# Patient Record
Sex: Female | Born: 1955 | Race: Black or African American | Hispanic: No | Marital: Single | Smoking: Never smoker
Health system: Southern US, Community
[De-identification: ages and names within clinical notes are randomized; demographics above are authoritative.]

## PROBLEM LIST (undated history)

## (undated) DIAGNOSIS — I10 Essential (primary) hypertension: Secondary | ICD-10-CM

## (undated) DIAGNOSIS — R0609 Other forms of dyspnea: Secondary | ICD-10-CM

## (undated) DIAGNOSIS — R0600 Dyspnea, unspecified: Secondary | ICD-10-CM

## (undated) HISTORY — DX: Dyspnea, unspecified: R06.00

## (undated) HISTORY — DX: Essential (primary) hypertension: I10

## (undated) HISTORY — DX: Other forms of dyspnea: R06.09

## (undated) HISTORY — PX: CARPAL TUNNEL RELEASE: SHX101

---

## 1999-02-13 ENCOUNTER — Encounter: Admission: RE | Admit: 1999-02-13 | Discharge: 1999-02-13 | Payer: Self-pay | Admitting: Sports Medicine

## 1999-04-30 ENCOUNTER — Other Ambulatory Visit: Admission: RE | Admit: 1999-04-30 | Discharge: 1999-04-30 | Payer: Self-pay | Admitting: *Deleted

## 1999-05-02 ENCOUNTER — Encounter: Admission: RE | Admit: 1999-05-02 | Discharge: 1999-05-02 | Payer: Self-pay | Admitting: Family Medicine

## 1999-05-27 ENCOUNTER — Emergency Department (HOSPITAL_COMMUNITY): Admission: EM | Admit: 1999-05-27 | Discharge: 1999-05-27 | Payer: Self-pay | Admitting: Emergency Medicine

## 1999-06-01 ENCOUNTER — Encounter: Admission: RE | Admit: 1999-06-01 | Discharge: 1999-06-01 | Payer: Self-pay | Admitting: Family Medicine

## 1999-06-19 ENCOUNTER — Encounter: Admission: RE | Admit: 1999-06-19 | Discharge: 1999-06-19 | Payer: Self-pay | Admitting: Sports Medicine

## 1999-07-03 ENCOUNTER — Encounter: Admission: RE | Admit: 1999-07-03 | Discharge: 1999-07-03 | Payer: Self-pay | Admitting: Family Medicine

## 2000-03-05 ENCOUNTER — Encounter: Admission: RE | Admit: 2000-03-05 | Discharge: 2000-03-05 | Payer: Self-pay | Admitting: Family Medicine

## 2000-03-20 ENCOUNTER — Encounter: Admission: RE | Admit: 2000-03-20 | Discharge: 2000-03-20 | Payer: Self-pay | Admitting: Family Medicine

## 2000-03-20 ENCOUNTER — Emergency Department (HOSPITAL_COMMUNITY): Admission: EM | Admit: 2000-03-20 | Discharge: 2000-03-20 | Payer: Self-pay | Admitting: Emergency Medicine

## 2000-04-02 ENCOUNTER — Encounter: Admission: RE | Admit: 2000-04-02 | Discharge: 2000-04-02 | Payer: Self-pay | Admitting: Family Medicine

## 2000-07-31 ENCOUNTER — Encounter: Admission: RE | Admit: 2000-07-31 | Discharge: 2000-07-31 | Payer: Self-pay | Admitting: Family Medicine

## 2000-11-19 ENCOUNTER — Emergency Department (HOSPITAL_COMMUNITY): Admission: EM | Admit: 2000-11-19 | Discharge: 2000-11-19 | Payer: Self-pay | Admitting: Emergency Medicine

## 2000-12-01 ENCOUNTER — Encounter: Admission: RE | Admit: 2000-12-01 | Discharge: 2000-12-01 | Payer: Self-pay | Admitting: Family Medicine

## 2001-04-01 ENCOUNTER — Encounter: Admission: RE | Admit: 2001-04-01 | Discharge: 2001-04-01 | Payer: Self-pay | Admitting: Family Medicine

## 2001-07-30 ENCOUNTER — Emergency Department (HOSPITAL_COMMUNITY): Admission: EM | Admit: 2001-07-30 | Discharge: 2001-07-30 | Payer: Self-pay | Admitting: Emergency Medicine

## 2001-07-30 ENCOUNTER — Encounter: Payer: Self-pay | Admitting: Emergency Medicine

## 2001-10-16 ENCOUNTER — Encounter: Admission: RE | Admit: 2001-10-16 | Discharge: 2001-10-16 | Payer: Self-pay | Admitting: Family Medicine

## 2001-10-16 ENCOUNTER — Other Ambulatory Visit: Admission: RE | Admit: 2001-10-16 | Discharge: 2001-10-16 | Payer: Self-pay | Admitting: Family Medicine

## 2002-01-08 ENCOUNTER — Encounter: Admission: RE | Admit: 2002-01-08 | Discharge: 2002-01-08 | Payer: Self-pay | Admitting: Family Medicine

## 2002-06-03 ENCOUNTER — Encounter: Admission: RE | Admit: 2002-06-03 | Discharge: 2002-06-03 | Payer: Self-pay | Admitting: Family Medicine

## 2002-09-09 ENCOUNTER — Encounter: Admission: RE | Admit: 2002-09-09 | Discharge: 2002-09-09 | Payer: Self-pay | Admitting: Family Medicine

## 2002-11-12 ENCOUNTER — Emergency Department (HOSPITAL_COMMUNITY): Admission: EM | Admit: 2002-11-12 | Discharge: 2002-11-12 | Payer: Self-pay | Admitting: Emergency Medicine

## 2002-11-17 ENCOUNTER — Encounter: Admission: RE | Admit: 2002-11-17 | Discharge: 2002-11-17 | Payer: Self-pay | Admitting: Family Medicine

## 2003-02-10 ENCOUNTER — Encounter: Admission: RE | Admit: 2003-02-10 | Discharge: 2003-02-10 | Payer: Self-pay | Admitting: Family Medicine

## 2003-04-28 ENCOUNTER — Encounter: Admission: RE | Admit: 2003-04-28 | Discharge: 2003-04-28 | Payer: Self-pay | Admitting: Sports Medicine

## 2003-04-28 ENCOUNTER — Other Ambulatory Visit: Admission: RE | Admit: 2003-04-28 | Discharge: 2003-04-28 | Payer: Self-pay | Admitting: Family Medicine

## 2003-12-05 ENCOUNTER — Encounter: Admission: RE | Admit: 2003-12-05 | Discharge: 2003-12-05 | Payer: Self-pay | Admitting: Sports Medicine

## 2003-12-06 ENCOUNTER — Encounter: Admission: RE | Admit: 2003-12-06 | Discharge: 2003-12-06 | Payer: Self-pay | Admitting: Sports Medicine

## 2003-12-15 ENCOUNTER — Encounter: Admission: RE | Admit: 2003-12-15 | Discharge: 2003-12-15 | Payer: Self-pay | Admitting: Sports Medicine

## 2003-12-22 ENCOUNTER — Encounter: Admission: RE | Admit: 2003-12-22 | Discharge: 2004-03-21 | Payer: Self-pay | Admitting: Sports Medicine

## 2004-02-22 ENCOUNTER — Encounter: Admission: RE | Admit: 2004-02-22 | Discharge: 2004-02-22 | Payer: Self-pay | Admitting: Family Medicine

## 2004-04-30 ENCOUNTER — Encounter: Admission: RE | Admit: 2004-04-30 | Discharge: 2004-04-30 | Payer: Self-pay | Admitting: Family Medicine

## 2004-10-04 ENCOUNTER — Ambulatory Visit: Payer: Self-pay | Admitting: Family Medicine

## 2005-01-22 ENCOUNTER — Ambulatory Visit: Payer: Self-pay | Admitting: Family Medicine

## 2005-05-07 ENCOUNTER — Ambulatory Visit: Payer: Self-pay | Admitting: Family Medicine

## 2005-08-21 ENCOUNTER — Ambulatory Visit: Payer: Self-pay | Admitting: Family Medicine

## 2005-08-29 ENCOUNTER — Ambulatory Visit (HOSPITAL_COMMUNITY): Admission: RE | Admit: 2005-08-29 | Discharge: 2005-08-29 | Payer: Self-pay | Admitting: Family Medicine

## 2005-09-23 ENCOUNTER — Ambulatory Visit: Payer: Self-pay | Admitting: Sports Medicine

## 2005-11-30 ENCOUNTER — Emergency Department (HOSPITAL_COMMUNITY): Admission: EM | Admit: 2005-11-30 | Discharge: 2005-11-30 | Payer: Self-pay | Admitting: Emergency Medicine

## 2006-02-23 ENCOUNTER — Encounter (INDEPENDENT_AMBULATORY_CARE_PROVIDER_SITE_OTHER): Payer: Self-pay | Admitting: *Deleted

## 2006-02-23 LAB — CONVERTED CEMR LAB

## 2006-03-05 ENCOUNTER — Ambulatory Visit: Payer: Self-pay | Admitting: Family Medicine

## 2006-06-12 ENCOUNTER — Ambulatory Visit: Payer: Self-pay | Admitting: Family Medicine

## 2006-07-15 ENCOUNTER — Ambulatory Visit: Payer: Self-pay | Admitting: Family Medicine

## 2006-08-14 ENCOUNTER — Ambulatory Visit: Payer: Self-pay | Admitting: Family Medicine

## 2006-10-02 ENCOUNTER — Ambulatory Visit: Payer: Self-pay | Admitting: Sports Medicine

## 2006-10-09 ENCOUNTER — Ambulatory Visit (HOSPITAL_COMMUNITY): Admission: RE | Admit: 2006-10-09 | Discharge: 2006-10-09 | Payer: Self-pay | Admitting: Family Medicine

## 2006-11-03 ENCOUNTER — Emergency Department (HOSPITAL_COMMUNITY): Admission: EM | Admit: 2006-11-03 | Discharge: 2006-11-03 | Payer: Self-pay | Admitting: Emergency Medicine

## 2007-01-22 DIAGNOSIS — N951 Menopausal and female climacteric states: Secondary | ICD-10-CM | POA: Insufficient documentation

## 2007-01-22 DIAGNOSIS — J309 Allergic rhinitis, unspecified: Secondary | ICD-10-CM | POA: Insufficient documentation

## 2007-01-22 DIAGNOSIS — I10 Essential (primary) hypertension: Secondary | ICD-10-CM | POA: Insufficient documentation

## 2007-01-23 ENCOUNTER — Encounter (INDEPENDENT_AMBULATORY_CARE_PROVIDER_SITE_OTHER): Payer: Self-pay | Admitting: *Deleted

## 2007-04-28 ENCOUNTER — Telehealth: Payer: Self-pay | Admitting: *Deleted

## 2007-04-28 ENCOUNTER — Encounter: Payer: Self-pay | Admitting: *Deleted

## 2007-04-30 ENCOUNTER — Ambulatory Visit: Payer: Self-pay | Admitting: Family Medicine

## 2007-04-30 DIAGNOSIS — G5600 Carpal tunnel syndrome, unspecified upper limb: Secondary | ICD-10-CM

## 2007-06-04 ENCOUNTER — Encounter (INDEPENDENT_AMBULATORY_CARE_PROVIDER_SITE_OTHER): Payer: Self-pay | Admitting: *Deleted

## 2007-06-10 ENCOUNTER — Telehealth (INDEPENDENT_AMBULATORY_CARE_PROVIDER_SITE_OTHER): Payer: Self-pay | Admitting: *Deleted

## 2007-07-08 ENCOUNTER — Ambulatory Visit: Payer: Self-pay | Admitting: Family Medicine

## 2007-07-08 ENCOUNTER — Encounter (INDEPENDENT_AMBULATORY_CARE_PROVIDER_SITE_OTHER): Payer: Self-pay | Admitting: *Deleted

## 2007-07-08 LAB — CONVERTED CEMR LAB
Calcium: 10.1 mg/dL (ref 8.4–10.5)
Creatinine, Ser: 0.8 mg/dL (ref 0.40–1.20)

## 2007-07-10 ENCOUNTER — Encounter (INDEPENDENT_AMBULATORY_CARE_PROVIDER_SITE_OTHER): Payer: Self-pay | Admitting: *Deleted

## 2007-07-21 ENCOUNTER — Encounter: Payer: Self-pay | Admitting: *Deleted

## 2007-07-21 ENCOUNTER — Ambulatory Visit: Payer: Self-pay | Admitting: Family Medicine

## 2008-02-29 ENCOUNTER — Ambulatory Visit: Payer: Self-pay | Admitting: Family Medicine

## 2008-06-23 ENCOUNTER — Ambulatory Visit: Payer: Self-pay | Admitting: Family Medicine

## 2008-06-23 ENCOUNTER — Encounter: Payer: Self-pay | Admitting: *Deleted

## 2008-10-04 ENCOUNTER — Telehealth: Payer: Self-pay | Admitting: *Deleted

## 2008-10-31 ENCOUNTER — Ambulatory Visit: Payer: Self-pay | Admitting: Family Medicine

## 2008-10-31 ENCOUNTER — Encounter: Payer: Self-pay | Admitting: Family Medicine

## 2008-10-31 LAB — CONVERTED CEMR LAB
Direct LDL: 114 mg/dL — ABNORMAL HIGH
Potassium: 3.2 meq/L — ABNORMAL LOW (ref 3.5–5.3)
Sodium: 142 meq/L (ref 135–145)

## 2008-11-01 ENCOUNTER — Telehealth: Payer: Self-pay | Admitting: *Deleted

## 2008-11-04 ENCOUNTER — Encounter: Payer: Self-pay | Admitting: Family Medicine

## 2008-11-04 DIAGNOSIS — E876 Hypokalemia: Secondary | ICD-10-CM | POA: Insufficient documentation

## 2009-08-03 ENCOUNTER — Ambulatory Visit: Payer: Self-pay | Admitting: Family Medicine

## 2009-08-04 ENCOUNTER — Encounter: Payer: Self-pay | Admitting: Family Medicine

## 2009-08-04 ENCOUNTER — Telehealth (INDEPENDENT_AMBULATORY_CARE_PROVIDER_SITE_OTHER): Payer: Self-pay | Admitting: *Deleted

## 2009-08-04 LAB — CONVERTED CEMR LAB
ALT: 16 units/L (ref 0–35)
CO2: 24 meq/L (ref 19–32)
Calcium: 9.8 mg/dL (ref 8.4–10.5)
Chloride: 106 meq/L (ref 96–112)
Sodium: 143 meq/L (ref 135–145)
Total Protein: 7.6 g/dL (ref 6.0–8.3)

## 2009-08-15 ENCOUNTER — Ambulatory Visit: Payer: Self-pay | Admitting: Gastroenterology

## 2009-08-24 ENCOUNTER — Ambulatory Visit: Payer: Self-pay | Admitting: Gastroenterology

## 2009-08-24 ENCOUNTER — Encounter: Payer: Self-pay | Admitting: Gastroenterology

## 2009-08-27 ENCOUNTER — Encounter: Payer: Self-pay | Admitting: Gastroenterology

## 2009-10-10 ENCOUNTER — Ambulatory Visit (HOSPITAL_COMMUNITY): Admission: RE | Admit: 2009-10-10 | Discharge: 2009-10-10 | Payer: Self-pay | Admitting: Family Medicine

## 2009-12-01 ENCOUNTER — Telehealth: Payer: Self-pay | Admitting: Family Medicine

## 2009-12-13 ENCOUNTER — Ambulatory Visit: Payer: Self-pay | Admitting: Family Medicine

## 2010-01-29 ENCOUNTER — Ambulatory Visit: Payer: Self-pay | Admitting: Family Medicine

## 2010-02-12 ENCOUNTER — Ambulatory Visit: Payer: Self-pay | Admitting: Family Medicine

## 2010-05-11 ENCOUNTER — Encounter: Payer: Self-pay | Admitting: Family Medicine

## 2010-06-18 ENCOUNTER — Encounter: Payer: Self-pay | Admitting: Family Medicine

## 2010-06-18 ENCOUNTER — Ambulatory Visit: Payer: Self-pay | Admitting: Family Medicine

## 2010-06-18 LAB — CONVERTED CEMR LAB
ALT: 20 units/L (ref 0–35)
Alkaline Phosphatase: 88 units/L (ref 39–117)
Glucose, Bld: 73 mg/dL (ref 70–99)
LDL Cholesterol: 116 mg/dL — ABNORMAL HIGH (ref 0–99)
Sodium: 142 meq/L (ref 135–145)
Total Bilirubin: 0.5 mg/dL (ref 0.3–1.2)
Total CHOL/HDL Ratio: 3.2
Total Protein: 7.4 g/dL (ref 6.0–8.3)
Triglycerides: 60 mg/dL (ref <150)
VLDL: 12 mg/dL (ref 0–40)

## 2010-06-19 ENCOUNTER — Encounter: Payer: Self-pay | Admitting: Family Medicine

## 2010-06-20 ENCOUNTER — Telehealth: Payer: Self-pay | Admitting: *Deleted

## 2010-09-04 ENCOUNTER — Telehealth: Payer: Self-pay | Admitting: Family Medicine

## 2010-09-04 ENCOUNTER — Emergency Department (HOSPITAL_COMMUNITY): Admission: EM | Admit: 2010-09-04 | Discharge: 2010-09-04 | Payer: Self-pay | Admitting: Family Medicine

## 2010-12-16 ENCOUNTER — Encounter: Payer: Self-pay | Admitting: Sports Medicine

## 2010-12-27 NOTE — Assessment & Plan Note (Signed)
Summary: CPE WITH PAP/KH   Vital Signs:  Patient profile:   55 year old female Weight:      141.1 pounds Temp:     98.2 degrees F oral Pulse rate:   78 / minute BP sitting:   136 / 90  (left arm) Cuff size:   regular  Vitals Entered By: Loralee Pacas CMA (June 18, 2010 9:12 AM)  Visit Type:  cpe Primary Provider:  [name]  MD   History of Present Illness: CC: CPE, F/U CTS, and rash  HPI: CTS has become worse since her last visit.  Pain comes and goes and radiates from both hands to arms and neck.  Still refuses steroid injections due to fear of needles.  Takes OTC Aleve 200mg  one tablet every 2 weeks for flare-ups.    Atopic dermatitis on both arms and chest.  Wants Rx for topical steroids and refill for Cetirizine tablets.  ROS:  Denies any CP, nausea/vomiting, SOB, or HA.  Does have nasal congestion c/w allergic rhinitis.  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 One Tab By Mouth Daily 2)  Hydrochlorothiazide 25 Mg Tabs (Hydrochlorothiazide) .... One Tab By Mouth Daily 3)  Klor-Con M20 20 Meq Cr-Tabs (Potassium Chloride Crys Cr) .... One Tab By Mouth Daily While Taking Hydrochlorothiazide  Current Medications (verified): 1)  Cetirizine Hcl 10 Mg Tabs (Cetirizine Hcl) .... One Tab By Mouth Daily 2)  Hydrochlorothiazide 25 Mg Tabs (Hydrochlorothiazide) .... One Tab By Mouth Daily 3)  Klor-Con M20 20 Meq Cr-Tabs (Potassium Chloride Crys Cr) .... One Tab By Mouth Daily While Taking Hydrochlorothiazide 4)  Triamcinolone Acetonide 0.1 % Oint (Triamcinolone Acetonide) .... Apply Ointment To Affected Area Twice A Day.  Allergies (verified): 1)  ! Codeine  Past History:  Past Medical History: Last updated:  01/22/2007 LFTs wnl and CR 0.7 in 6/04, SCr = 0.71 (05/2006), trichomonas (10/2001)  Past Surgical History: Last updated: 02/12/2010 c/s x2; G2P2 Cholecystectomy - 09/25/1998, Tubal ligation - 09/25/1985  Family History: Last updated: 01/22/2007 father - unknown, mother - HTN, sisters x3 - sickle cell, son - mild MR  Social History: Last updated: 07/08/2007 lives w/ long time boyfriend of >31yrs; 2 adults sons in home; ; + etoh 1-2beers a day and binge on weekends - (2) 12 packs; no tobacco; most of family in [place].  used to work at [employer].    Risk Factors: Smoking Status: never (01/29/2010) Packs/Day: 1-2 per week (02/29/2008)  Family History: Reviewed history from 01/22/2007 and no changes required. father - unknown, mother - HTN, sisters x3 - sickle cell, son - mild MR  Social History: Reviewed history from 07/08/2007 and no changes required. lives w/ long time boyfriend of >57yrs; 2 adults sons in home; ; + etoh 1-2beers a day and binge on weekends - (2) 12 packs; no tobacco; most of family in [place].  used to work at [employer].    Review of Systems       per hpi  Physical Exam  General:  Well-developed,well-nourished,in no acute distress; alert,appropriate and cooperative throughout examination Head:  Normocephalic and atraumatic without obvious abnormalities. No apparent alopecia or balding. Eyes:  No corneal or conjunctival inflammation noted. EOMI. Perrla. Funduscopic exam benign, without hemorrhages, exudates  or papilledema. Vision grossly normal. Mouth:  Oral mucosa and oropharynx without lesions or exudates.  Teeth in good repair. Lungs:  Normal respiratory effort, chest expands symmetrically. Lungs are clear to auscultation, no crackles or wheezes. Heart:  Normal rate and regular rhythm. S1 and S2 normal without gallop, murmur, click, rub or other extra sounds. Abdomen:  Bowel sounds positive,abdomen soft and non-tender without masses, organomegaly or hernias noted. Msk:  no joint  swelling, no joint warmth, no muscle atrophy, joint tenderness, and enlarged PIP joints.     Impression & Recommendations:  Problem # 1:  CARPAL TUNNEL SYNDROME (ICD-354.0) Assessment Unchanged  Patient still c/o pain and numbness in both hands and arms.  States the pain is getting worse.  Does not like needles and hesitant to see Sports Med for steroid injections.  Recommended cock-up splints from [facility] or [facility] to help alleviate symptoms.  Pt agreed to try splints and consider  steroid injections if pain becomes worse.  Will continue to take OTC Aleve for flare-ups.  Advised pt to return to clinic if symptoms become worse or call clinic to set up appt with Sports Medicine clinic.  Orders: [facility]- Est  Level 4 ([address])  Problem # 2:  Preventive Health Care (ICD-V70.0) Assessment: Unchanged Pt refused PAP today.  She did not have time for it at this appt.  Advised pt to reschedule PAP in 1 month.  Pt has completed mammogram and colonoscopy this year and said results were normal.  Will screen for hyperlipidemia today.    Problem # 3:  HYPERTENSION, BENIGN SYSTEMIC (ICD-401.1) Assessment: Unchanged Today pt's BP was 136/90.  BP at last office visit 02/12/2010 was 132/90.  Will continue HCTZ 25 mg one tab by mouth daily.    Her updated medication list for this problem includes:    Hydrochlorothiazide 25 Mg Tabs (Hydrochlorothiazide) ..... One tab by mouth daily Orders: Lipid-[facility] (60454-09811) [facility]- Est  Level 4 (91478)  Problem # 4:  HYPOKALEMIA, MILD (ICD-276.8) Assessment: Unchanged Pt's potassium was 3.3 on 08/04/2009.  Started on Klor-Con.  Will order CMET today to re-evaluate mild hypokalemia.  Orders: Comp Met-[facility] ([id number]) [facility]- Est  Level 4 (57846)  Problem # 5:  RHINITIS, ALLERGIC (ICD-477.9) Pt c/o nasal congestion c/w allergic rhinitis.  Also c/o of mild rash on arms and chest c/w atopic dermatitis.  Pt given refill Cetirizine hcl and Rx triamcinolone ointment for rash.     Her updated medication list for this problem includes:    Cetirizine Hcl 10 Mg Tabs (Cetirizine hcl) ..... One tab by mouth daily  Orders: [facility]- Est  Level 4 ([address]295)  Complete Medication List: 1)  Cetirizine Hcl 10 Mg Tabs (Cetirizine hcl) .... One tab by mouth daily 2)  Hydrochlorothiazide 25 Mg Tabs (Hydrochlorothiazide) .... One tab by mouth daily 3)  Klor-con M20 20 Meq Cr-tabs (Potassium chloride crys cr) .... One tab by mouth daily while taking hydrochlorothiazide 4)  Triamcinolone Acetonide 0.1 % Oint (Triamcinolone acetonide) .... Apply ointment to affected area twice a day.  Patient Instructions: 1)  Please schedule appt for PAP in 1 month. 2)  Consider purchasing splints at [facility] for CTS.  3)  Consider making an appt with Sports Medicine for steroid injections if CTS pain becomes worse. 4)  Will call or mail a letter to you if any lab values are abnormal. 5)  Please schedule a follow-up appointment in 1 year. Prescriptions: TRIAMCINOLONE ACETONIDE 0.1 % OINT (TRIAMCINOLONE ACETONIDE) Apply ointment to affected area twice a  day.  #1 x 2   Entered and Authorized by:   Ivy de Lawson Radar  MD   Signed by:   Ivy de Lawson Radar  MD on 06/18/2010   Method used:   Print then Give to Patient   RxID:   [id number] CETIRIZINE HCL 10 MG TABS (CETIRIZINE HCL) one tab by mouth daily  #30 x 3   Entered and Authorized by:   Ivy de Lawson Radar  MD   Signed by:   [name]  MD on 06/18/2010   Method used:   Print then Give to Patient   RxID:   [id number]

## 2010-12-27 NOTE — Miscellaneous (Signed)
  Clinical Lists Changes  Problems: Removed problem of GASTROENTERITIS, VIRAL (ICD-008.8) Removed problem of SCREENING, COLON CANCER (ICD-V76.51) Medications: Removed medication of PROMETHAZINE HCL 12.5 MG TABS (PROMETHAZINE HCL) Take 1 tab every 4 hours as needed for nausea

## 2010-12-27 NOTE — Progress Notes (Signed)
Summary: re: letters  ---- Converted from flag ---- ---- 06/19/2010 8:02 PM, [name]  MD wrote: Could you please mail both letters to this patient?  I tried routing it to the white team but it didn't work.  Thanks! ------------------------------ Mailed out to pt. today.[name], CMA  June 20, 2010 9:07 AM

## 2010-12-27 NOTE — Assessment & Plan Note (Signed)
Summary: diarrhea,df   Vital Signs:  Patient profile:   55 year old female Height:      63.5 inches Weight:      138.2 pounds BMI:     24.18 Temp:     97.9 degrees F oral Pulse rate:   116 / minute BP sitting:   122 / 89  (left arm) Cuff size:   regular  Vitals Entered By: Garen Grams LPN (January 29, 1609 11:02 AM) CC: diarrhea and nausea x 2 days Is Patient Diabetic? No Pain Assessment Patient in pain? no        Primary Care Provider:  Lequita Asal  MD  CC:  diarrhea and nausea x 2 days.  History of Present Illness: 1. Diarrhea: Pt has had diarrhea since yesterday morning.  There has been multiple sick contacts including her grandson and husband.  Grandson brought to the ED with diarrhea and diagnosed with a stomach bug.  Then her and her husband developed diarrhea.  Husband is feeling better but she is feeling worse.  She has had 2-3 episodes of diarrhea in the past 24 hours.        ROS: Endorses abdominal pain, nausea, but no vomiting.  Denies blood in her stool.  Habits & Providers  Alcohol-Tobacco-Diet     Tobacco Status: never  Current Problems (verified): 1)  Screening, Colon Cancer  (ICD-V76.51) 2)  Hypokalemia, Mild  (ICD-276.8) 3)  Carpal Tunnel Syndrome  (ICD-354.0) 4)  Rhinitis, Allergic  (ICD-477.9) 5)  Menopausal Syndrome  (ICD-627.2) 6)  Hypertension, Benign Systemic  (ICD-401.1)  Current Medications (verified): 1)  Promethazine Hcl 12.5 Mg Tabs (Promethazine Hcl) .... Take 1 Tab Every 4 Hours As Needed For Nausea  Allergies: 1)  ! Codeine  Past History:  Past Medical History: Reviewed history from 01/22/2007 and no changes required. LFTs wnl and CR 0.7 in 6/04, SCr = 0.71 (05/2006), trichomonas (10/2001)  Social History: Reviewed history from 07/08/2007 and no changes required. lives w/ long time boyfriend of >25yrs; 2 adults sons in home; ; + etoh 1-2beers a day and binge on weekends - (2) 12 packs; no tobacco; most of family in [place].  used  to work at [employer].    Physical Exam  General:  Well-developed,well-nourished,in no acute distress; alert,appropriate and cooperative throughout examination. Vitals reviewed and afebrile. Mouth:  edentulous.  OP pink and moist Neck:  no LAD Lungs:  Normal respiratory effort, chest expands symmetrically. Lungs are clear to auscultation, no crackles or wheezes. Heart:  Normal rate and regular rhythm. S1 and S2 normal without gallop, murmur, click, rub or other extra sounds. Abdomen:  Mildly diffusely TTP.  +BS.  No guarding or rebound.  No masses. Extremities:  no edema bilateral lower extremities Skin:  turgor normal, color normal, and no rashes.   Psych:  Oriented X3 and not depressed appearing.     Impression & Recommendations:  Problem # 1:  GASTROENTERITIS, VIRAL (ICD-008.8) Assessment New  She is tolerating POs.  Gave her supportive care measures.  Provided Phenergan for nausea.  Given precautions to seek care sooner.  Orders: [facility]- Est Level  3 (96045)  Complete Medication List: 1)  Promethazine Hcl 12.5 Mg Tabs (Promethazine hcl) .... Take 1 tab every 4 hours as needed for nausea  Patient Instructions: 1)  You most likely have a stomach bug 2)  This is almost always caused by a virus 3)  You need to just let it run its course 4)  I have prescribed  Phenergan for your nausea 5)  Be sure to drink plenty of fluids 6)  If you are unable to keep anything down, have bad abdominal pain, or feel a lot worse than you should be seen again 7)  If not better in 7 days then please return to clinic Prescriptions: PROMETHAZINE HCL 12.5 MG TABS (PROMETHAZINE HCL) Take 1 tab every 4 hours as needed for nausea  #20 x 0   Entered and Authorized by:   Angelena Sole MD   Signed by:   Angelena Sole MD on 01/29/2010   Method used:   Print then Give to Patient   RxID:   [id number] PROMETHAZINE HCL 12.5 MG TABS (PROMETHAZINE HCL) Take 1 tab every 4 hours as needed for nausea  #0 x 0    Entered and Authorized by:   Angelena Sole MD   Signed by:   Angelena Sole MD on 01/29/2010   Method used:   Print then Give to Patient   RxID:   [id number]

## 2010-12-27 NOTE — Letter (Signed)
Summary: Generic Letter  [facility]  [address]   [place], [place] [address]   Phone: [phone]  Fax: [phone]    06/19/2010  Daisy [name] [address] [place], [place]  [address]  Dear [name],  I hope this letter finds you well.  The results of your comprehensive metabolic panel are complete and your lab values are all within normal limits.  Please call the [facility] if you have any questions or concerns.   Tests: (1) Comprehensive Metabolic Panel ([address])   Sodium                    142 mEq/L                   135-145   Potassium                 3.8 mEq/L                   3.5-5.3   Chloride                  105 mEq/L                   96-112   CO2                       25 mEq/L                    19-32   Glucose                   73 mg/dL                    40-34   BUN                       10 mg/dL                    7-42   Creatinine                1.01 mg/dL                  0.40-1.20   Bilirubin, Total          0.5 mg/dL                   5.9-5.6   Alkaline Phosphatase      88 U/L                      39-117   AST/SGOT                  22 U/L                      0-37   ALT/SGPT                  20 U/L                      0-35   Total Protein             7.4 g/dL                    3.8-7.5   Albumin  4.5 g/dL                    1.6-1.0   Calcium                   9.8 mg/dL                   9.6-04.5    Sincerely,   [place], DO

## 2010-12-27 NOTE — Progress Notes (Signed)
Summary: RX  Phone Note Refill Request Call back at Home Phone [phone]   Refills Requested: Medication #1:  HYDROCHLOROTHIAZIDE 25 MG TABS Take 1 tablet by mouth once a day  Medication #2:  TRIAMCINOLONE ACETONIDE 0.1 % CREA apply bid to affected area  dispense 30 grams [facility]  Initial call taken by: Knox Royalty,  December 01, 2009 1:53 PM  Follow-up for Phone Call        will forward to MD. Follow-up by: Theresia Lo RN,  December 04, 2009 11:55 AM    Prescriptions: TRIAMCINOLONE ACETONIDE 0.1 % CREA (TRIAMCINOLONE ACETONIDE) apply bid to affected area  dispense 30 grams  #1 x 3   Entered and Authorized by:   Lequita Asal  MD   Signed by:   Lequita Asal  MD on 12/04/2009   Method used:   Faxed to ...       [facility] (retail)       [address] [place], [place]  [address]       Ph: [phone]       Fax: [phone]   RxID:   [id number] HYDROCHLOROTHIAZIDE 25 MG TABS (HYDROCHLOROTHIAZIDE) Take 1 tablet by mouth once a day  #30 x 5   Entered and Authorized by:   Lequita Asal  MD   Signed by:   Lequita Asal  MD on 12/04/2009   Method used:   Faxed to ...       [facility] (retail)       [address] [place], [place]  [address]       Ph: [phone]       Fax: [phone]   RxID:   [id number]   Appended Document: RX above Rx called to pharmacy as pharmacy did not receive.

## 2010-12-27 NOTE — Progress Notes (Signed)
  Phone Note Call from Patient   Caller: Patient Call For: [phone] Summary of Call: Patient have rash on arms due to ?poison ivy.  Itching and burning to skin.   Initial call taken by: Abundio Miu,  September 04, 2010 12:04 PM  Follow-up for Phone Call        states it itches badly. told her we have no appts left for today. she declined UC due to long waits. appt for 8:30am. advissed getting antiitch creme from pharmacy or use calamine. states she will Follow-up by: Golden Circle RN,  September 04, 2010 12:07 PM

## 2010-12-27 NOTE — Assessment & Plan Note (Signed)
Summary: f/u last visit/eo   Vital Signs:  Patient profile:   55 year old female Weight:      140.4 pounds BMI:     24.57 Temp:     98.7 degrees F oral Pulse rate:   82 / minute Pulse rhythm:   regular BP sitting:   135 / 100  (right arm) Cuff size:   regular  Vitals Entered By: Loralee Pacas CMA (December 13, 2009 2:02 PM) CC: f/u HTN, carpal tunnel   Primary Care Provider:  Lequita Asal  MD  CC:  f/u HTN and carpal tunnel.  History of Present Illness: patient is 55 y/o female here for f/u.  HTN- denies CP, SOB, peripheral edema, DOE, visual changes, HA. taking HCTZ without side effects  Carpal tunnel syndrome- worsening numbness, tingling of bilateral hands. interested in surgery. wearing brace.   Current Medications (verified): 1)  None  Allergies (verified): 1)  ! Codeine  Physical Exam  General:  Well-developed,well-nourished,in no acute distress; alert,appropriate and cooperative throughout examination. vitals reviewed.  Mouth:  edentulous Lungs:  Normal respiratory effort, chest expands symmetrically. Lungs are clear to auscultation, no crackles or wheezes. Heart:  Normal rate and regular rhythm. S1 and S2 normal without gallop, murmur, click, rub or other extra sounds. Msk:  brace on L wrist. TTP of R posterior shoulder.  Extremities:  no edema bilateral lower extremities    Impression & Recommendations:  Problem # 1:  HYPERTENSION, BENIGN SYSTEMIC (ICD-401.1) Assessment Deteriorated  off meds. refills sent prior. f/u 1 month  The following medications were removed from the medication list:    Hydrochlorothiazide 25 Mg Tabs (Hydrochlorothiazide) .Marland Kitchen... Take 1 tablet by mouth once a day  Orders: [facility]- Est Level  3 (04540)  Problem # 2:  CARPAL TUNNEL SYNDROME (ICD-354.0) Assessment: Unchanged  patient opposed to injections. uninsured. continue anti-inflammatory meds and brace as needed  Orders: [facility]- Est Level  3 (98119)  Patient  Instructions: 1)  Please schedule follow up appointment in 4-6 weeks for pap smear and f/u blood pressure Prescriptions: TRIAMCINOLONE ACETONIDE 0.1 % CREA (TRIAMCINOLONE ACETONIDE) apply bid to affected area  dispense 30 grams  #1 x 3   Entered and Authorized by:   Lequita Asal  MD   Signed by:   Lequita Asal  MD on 12/13/2009   Method used:   Electronically to        [facility] (retail)       [address]       [place], [place]  [address]       Ph: [phone]       Fax: [phone]   RxID:   [id number] HYDROCHLOROTHIAZIDE 25 MG TABS (HYDROCHLOROTHIAZIDE) Take 1 tablet by mouth once a day  #30 x 5   Entered and Authorized by:   Lequita Asal  MD   Signed by:   Lequita Asal  MD on 12/13/2009   Method used:   Electronically to        [facility] (retail)       [address]       [place], [place]  [address]       Ph: [phone]       Fax: [phone]   RxID:   [id number]

## 2010-12-27 NOTE — Assessment & Plan Note (Signed)
Summary: sore throat /[place]/   Vital Signs:  Patient profile:   55 year old female Weight:      143.6 pounds Temp:     98.1 degrees F oral Pulse rate:   75 / minute Pulse rhythm:   regular BP sitting:   132 / 90  Vitals Entered By: Loralee Pacas CMA (February 12, 2010 10:44 AM) CC: Sore Throat Comments sore throat since yesterday   CC:  Sore Throat.  Acute Visit History:      The patient complains of nausea, sinus problems, and sore throat.  These symptoms began one day ago.  She denies abdominal pain, chest pain, cough, earache, eye symptoms, fever, headache, and nasal discharge.  Other comments include: h/o allergic rhinitis. has not tried any medications. no sick contacts. no SOB/Chest pain. .        She has had dysphagia associated with the sore throat.  There is no history of drooling, cold/URI symptoms, or recent exposure to strep.        She complains of nasal congestion.  She denies a previous history of sinusitis or previous sinus surgery.        Current Medications (verified): 1)  Promethazine Hcl 12.5 Mg Tabs (Promethazine Hcl) .... Take 1 Tab Every 4 Hours As Needed For Nausea 2)  Cetirizine Hcl 10 Mg Tabs (Cetirizine Hcl) .... One Tab By Mouth Daily 3)  Hydrochlorothiazide 25 Mg Tabs (Hydrochlorothiazide) .... One Tab By Mouth Daily 4)  Klor-Con M20 20 Meq Cr-Tabs (Potassium Chloride Crys Cr) .... One Tab By Mouth Daily While Taking Hydrochlorothiazide  Allergies (verified): 1)  ! Codeine  Past History:  Past Surgical History: c/s x2; G2P2 Cholecystectomy - 09/25/1998, Tubal ligation - 09/25/1985  Physical Exam  General:  Well-developed,well-nourished,in no acute distress; alert,appropriate and cooperative throughout examination. Vitals reviewed and afebrile. Head:  no TTP of frontal or maxillary sinuses Eyes:  no scleral injection or icterus Nose:  no rhinorrhea Mouth:  edentulous. erythema of posterior oropharynx without exudate. Neck:  no lymphadenopathy Lungs:   Normal respiratory effort, chest expands symmetrically. Lungs are clear to auscultation, no crackles or wheezes. Heart:  Normal rate and regular rhythm. S1 and S2 normal without gallop, murmur, click, rub or other extra sounds.   Impression & Recommendations:  Problem # 1:  SORE THROAT (ICD-462) Assessment New  likely 2/2 postnasal drip. h/o allergic rhinitis. will treat with cetirizine.   Orders: [facility]- Est Level  3 ([address]40)  Problem # 2:  HYPERTENSION, BENIGN SYSTEMIC (ICD-401.1) Assessment: Unchanged  refill of HCTZ and potassium chloride.   Her updated medication list for this problem includes:    Hydrochlorothiazide 25 Mg Tabs (Hydrochlorothiazide) ..... One tab by mouth daily  Orders: [facility]- Est Level  3 (98119) Prescriptions: KLOR-CON M20 20 MEQ CR-TABS (POTASSIUM CHLORIDE CRYS CR) one tab by mouth daily WHILE TAKING HYDROCHLOROTHIAZIDE  #30 x 3   Entered and Authorized by:   Lequita Asal  MD   Signed by:   Lequita Asal  MD on 02/12/2010   Method used:   Electronically to        [facility] [phone]* (retail)       [address]       [place], [place]  [address]       Ph: [phone]       Fax: [phone]   RxID:   [id number] HYDROCHLOROTHIAZIDE 25 MG TABS (HYDROCHLOROTHIAZIDE) one tab by mouth daily  #30 x 3   Entered and Authorized  by:   Lequita Asal  MD   Signed by:   Lequita Asal  MD on 02/12/2010   Method used:   Electronically to        [facility] [address] [phone]* (retail)       [address]       [place], [place]  [address]       Ph: [phone]       Fax: [phone]   RxID:   [id number] CETIRIZINE HCL 10 MG TABS (CETIRIZINE HCL) one tab by mouth daily  #30 x 3   Entered and Authorized by:   Lequita Asal  MD   Signed by:   Lequita Asal  MD on 02/12/2010   Method used:   Electronically to        [facility] [address] [phone]* (retail)       [address]       [place], [place]  [address]       Ph: [phone]        Fax: [phone]   RxID:   [id number]    Prevention & Chronic Care Immunizations   Influenza vaccine: refused  (11/04/2008)   Influenza vaccine deferral: Not available  (08/03/2009)   Influenza vaccine due: 11/04/2009    Tetanus booster: 04/26/1999: Done.   Tetanus booster due: 04/25/2009    Pneumococcal vaccine: Not documented  Colorectal Screening   Hemoccult: Not documented    Colonoscopy: Location:  [facility].    (08/24/2009)   Colonoscopy action/deferral: GI Referral  (08/03/2009)   Colonoscopy due: 07/2019  Other Screening   Pap smear: refused  (11/04/2008)   Pap smear due: 11/04/2009    Mammogram: ASSESSMENT: Negative - BI-RADS 1^MM DIGITAL SCREENING  (10/10/2009)   Mammogram action/deferral: Ordered  (08/03/2009)   Mammogram due: 09/26/2007   Smoking status: never  (01/29/2010)  Lipids   Total Cholesterol: Not documented   LDL: Not documented   LDL Direct: 114  (10/31/2008)   HDL: Not documented   Triglycerides: Not documented  Hypertension   Last Blood Pressure: 132 / 90  (02/12/2010)   Serum creatinine: 1.04  (08/04/2009)   Serum potassium 3.3  (08/04/2009)    Hypertension flowsheet reviewed?: Yes   Progress toward BP goal: At goal  Self-Management Support :   Personal Goals (by the next clinic visit) :      Personal blood pressure goal: 140/90  (08/03/2009)   Hypertension self-management support: Written self-care plan  (08/03/2009)    Hypertension self-management support not done because: Good outcomes  (02/12/2010)

## 2010-12-27 NOTE — Letter (Signed)
Summary: Lipid Letter  [facility]  [address]   [place], [place] [address]   Phone: [phone]  Fax: [phone]    06/19/2010  Daisy [name] [address] [place], [place]  [address]  Dear Daisy [name]:  We have carefully reviewed your last lipid profile from 06/18/2010 and the results are noted below with a summary of recommendations for lipid management.    Cholesterol:       186     Goal: < 200   HDL "good" Cholesterol:   58     Goal: > 39   LDL "bad" Cholesterol:   116     Goal: < 100   Triglycerides:       60     Goal: < 150        TLC Diet (Therapeutic Lifestyle Change): Saturated Fats & Transfatty acids should be kept < 7% of total calories ***Reduce Saturated Fats Polyunstaurated Fat can be up to 10% of total calories Monounsaturated Fat Fat can be up to 20% of total calories Total Fat should be no greater than 25-35% of total calories Carbohydrates should be 50-60% of total calories Protein should be approximately 15% of total calories Fiber should be at least 20-30 grams a day ***Increased fiber may help lower LDL Total Cholesterol should be < 200mg /day Consider adding plant stanol/sterols to diet (example: Benacol spread) ***A higher intake of unsaturated fat may reduce Triglycerides and Increase HDL    Adjunctive Measures (may lower LIPIDS and reduce risk of Heart Attack) include: Aerobic Exercise (20-30 minutes 3-4 times a week) Limit Alcohol Consumption Weight Reduction Aspirin 75-81 mg a day by mouth (if not allergic or contraindicated) Dietary Fiber 20-30 grams a day by mouth     Current Medications: 1)    Cetirizine Hcl 10 Mg Tabs (Cetirizine hcl) .... One tab by mouth daily 2)    Hydrochlorothiazide 25 Mg Tabs (Hydrochlorothiazide) .... One tab by mouth daily 3)    Klor-con M20 20 Meq Cr-tabs (Potassium chloride crys cr) .... One tab by mouth daily while taking hydrochlorothiazide 4)    Triamcinolone Acetonide 0.1 % Oint  (Triamcinolone acetonide) .... Apply ointment to affected area twice a day.  If you have any questions, please call. We appreciate being able to work with you.   Sincerely,    [facility] Aashrith Eves de Lawson Radar  MD

## 2011-02-12 ENCOUNTER — Encounter: Payer: Self-pay | Admitting: Family Medicine

## 2011-02-12 ENCOUNTER — Ambulatory Visit (INDEPENDENT_AMBULATORY_CARE_PROVIDER_SITE_OTHER): Payer: Self-pay | Admitting: Family Medicine

## 2011-02-12 VITALS — BP 118/94 | HR 74 | Temp 98.3°F | Ht 64.0 in | Wt 149.0 lb

## 2011-02-12 DIAGNOSIS — J029 Acute pharyngitis, unspecified: Secondary | ICD-10-CM

## 2011-02-12 MED ORDER — GUAIFENESIN 100 MG/5ML PO LIQD: 200.0000 mg | Freq: Three times a day (TID) | ORAL | Status: AC | PRN

## 2011-02-12 MED ORDER — HYDROCHLOROTHIAZIDE 25 MG PO TABS: 25.0000 mg | ORAL_TABLET | Freq: Every day | ORAL | Status: DC

## 2011-02-12 NOTE — Assessment & Plan Note (Signed)
Cough and sore throat likely viral in etiology.  No fever.  Pharyngeal exam wnl, no nodes.  Will treat supportively with Tylenol for comfort.  Will give gauifenesin for cough. Some of evening cough likely post nasal drip.  I've call [facility] and they do not have any allergy med for adults currently.  I have instruct pt to go to [facility] to buy OTC allergy med.

## 2011-02-12 NOTE — Patient Instructions (Signed)
Please come back in one week if sore throat and cough not better.  Your cough is likely from a virus so no need for antibiotic. You should take over the counter allergy medicine. For cough you can take Guaifenesin.  I've sent this to [facility]. Please make an appt with Dr Tye Savoy for blood pressure.   Upper Respiratory Infection (URI), Adult An upper respiratory infection (URI) is also known as the common cold. It is often caused by a virus. Colds are easily spread (contagious). You can pass it to others by touch or by drinking out of the same glass. You may have:  A runny nose.  Sneezing.   Coughing.   A stuffy nose (nasal congestion).  A sinus infection.   A sore throat.  A scratchy voice (hoarseness).   You can also have:  Tiredness (fatigue).  Muscle aches.   A headache.  A mild fever.   Usually, you get well in a week or two. Your doctor will know if you have a URI by talking to you and examining you. HOME CARE  Inhale heated mist or steam (vaporizer or shower).   Sip chicken soup.   Get plenty of rest.   Use lozenges for throat comfort.   Rinse your mouth (gargle) with warm water or salt water (1/4 teaspoon salt in 8 ounces of water).   Only take medicine as told by your doctor.   Drink enough water and fluids to keep your pee (urine) clear or pale yellow.   Rest as needed.   Return to work when your temperature has returned to normal or as told by your doctor. Use a face mask and wash your hands to stop your cold from spreading.  GET HELP IF:  You have a temperature by mouth above 102.  After the first few days, you feel you are getting worse, not better.   You have questions about your medicine.  GET HELP RIGHT AWAY IF:  You have a temperature by mouth above 102, not controlled by medicine.   You have a bad or lasting headache, ear pain, sinus pain, or chest pain.   You have trouble breathing or get short of breath.   You have a lasting cough,  cough up blood, or have a change in your usual mucus.   You have sore muscles, a stiff neck, or a very bad headache, not controlled with medicine.  MAKE SURE YOU:  Understand these instructions.   Will watch your condition.   Will get help right away if you are not doing well or get worse.  Document Released: 04/29/2008 Document Re-Released: 02/05/2010 [facility] Patient Information 2011 [place], [place].

## 2011-02-12 NOTE — Progress Notes (Signed)
  Subjective:    Patient ID: Daisy [name], female    DOB: 1956/02/23, 55 y.o.   MRN: [id number]  HPI Sore throat x 3-4 wk. No fever. +chills.  Cough with white sputum x 3 days.  Cough is worse at night when she tries to sleep.  No rhinorrhea.  +HA. +watery eyes.  No dyspnea.  No nausea/vomiting.   Tobacco use: no smoke.  NO history of asthma.  Weight change: +gain   Review of Systems    per hpi   Objective:   Physical Exam General:  Alert, NAD, Oriented to self, place, situation. Vitals reviewed.  Head:  Normocephalic and atraumatic without obvious abnormalities. Mouth:  Oral mucosa and oropharynx without lesions or exudates.   Ears: TM clear, no fluid, no redness Neck:  No deformities, masses, or tenderness to palpation noted.  No carotid bruits. No nodes. Lungs:  Normal respiratory effort, chest expands symmetrically. Lungs are clear to auscultation, no crackles or wheezes, poor air movement in bases.  Heart:  Normal rate and regular rhythm. S1 and S2 normal without gallop, murmur, click, rub or other extra sounds. Extremities:  No edema         Assessment & Plan:

## 2011-02-19 ENCOUNTER — Other Ambulatory Visit: Payer: Self-pay | Admitting: *Deleted

## 2011-02-19 DIAGNOSIS — L309 Dermatitis, unspecified: Secondary | ICD-10-CM

## 2011-02-19 MED ORDER — TRIAMCINOLONE ACETONIDE 0.1 % EX OINT: TOPICAL_OINTMENT | Freq: Two times a day (BID) | CUTANEOUS | Status: DC

## 2011-06-03 ENCOUNTER — Telehealth: Payer: Self-pay | Admitting: *Deleted

## 2011-06-03 NOTE — Telephone Encounter (Signed)
Patient walked into the clinic this afternoon c/o left arm pain at the elbow.  States that she helped her son move some heavy items about 3 days ago and arm has been painful since.  She is favoring the arm and winces when I try to fully extend it.  When I palpated just above the inner elbow towards the bicep she c/o localized pain.  She has been taking Aleve with little relief. Told her we did not have any appointments at the present moment but if she could wait about 30 minutes to an hour we could squeeze her in.  She declined stating she did not want to wait.   Advised her to try to immobilize the arm as much as possible and to apply ice to the spot that is tender to touch.  Also told her that if it got worse to call us and we would work her in tomorrow.  Patient agreeable.

## 2011-06-11 ENCOUNTER — Ambulatory Visit: Payer: Self-pay | Admitting: Family Medicine

## 2011-06-25 ENCOUNTER — Ambulatory Visit (INDEPENDENT_AMBULATORY_CARE_PROVIDER_SITE_OTHER): Payer: Self-pay | Admitting: Family Medicine

## 2011-06-25 ENCOUNTER — Encounter: Payer: Self-pay | Admitting: Family Medicine

## 2011-06-25 DIAGNOSIS — M25522 Pain in left elbow: Secondary | ICD-10-CM

## 2011-06-25 DIAGNOSIS — E876 Hypokalemia: Secondary | ICD-10-CM

## 2011-06-25 DIAGNOSIS — M25529 Pain in unspecified elbow: Secondary | ICD-10-CM

## 2011-06-25 MED ORDER — IBUPROFEN 600 MG PO TABS: 600.0000 mg | ORAL_TABLET | Freq: Three times a day (TID) | ORAL | Status: AC | PRN

## 2011-06-25 MED ORDER — HYDROCHLOROTHIAZIDE 25 MG PO TABS: 25.0000 mg | ORAL_TABLET | Freq: Every day | ORAL | Status: DC

## 2011-06-25 MED ORDER — POTASSIUM CHLORIDE 20 MEQ PO PACK: 20.0000 meq | PACK | Freq: Every day | ORAL | Status: DC

## 2011-06-25 NOTE — Patient Instructions (Signed)
Please take Motrin 600 mg 1 tablet every 8 hours as needed for pain. If symptoms worsen, please call MD or return to clinic. Please schedule follow up appointment with me in 4-6 weeks.

## 2011-06-30 DIAGNOSIS — M25522 Pain in left elbow: Secondary | ICD-10-CM | POA: Insufficient documentation

## 2011-06-30 NOTE — Assessment & Plan Note (Addendum)
Likely secondary to recent injury/overuse vs. DJD vs. Referred pain from CTS. Will treat pain with Motrin 600 q 8 hours as needed for pain. Advised patient to return to clinic if symptoms worsen, pain does not improve on Motrin, or if she cannot flex/extend elbow. Recommended rest, ice, and no heavy lifting until symptoms improve. Return in 4-6 weeks for follow up.

## 2011-06-30 NOTE — Assessment & Plan Note (Signed)
Will refill both HCTZ and Klor-Con.  Patient to return in 4-6 weeks. Will draw BMET and check Potassium level at that time.

## 2011-06-30 NOTE — Progress Notes (Signed)
  Subjective:    Patient ID: Daisy [name], female    DOB: 03/16/1956, 55 y.o.   MRN: [id number]  HPI    Review of Systems       Objective:   Physical Exam        Assessment & Plan:   Subjective:    Daisy [name] is a 55 y.o. female who presents with left elbow pain. Onset of the symptoms was about a month ago. Inciting event: increased use after helping son move out of the house last month.  Current symptoms include: pain radiating to the biceps.  Pain is aggravated by: extension. Symptoms have progressed to a point and plateaued. Patient has had prior elbow problems. Evaluation to date: none. Treatment to date: OTC analgesics.  Took 2 tablets Aleve which help at first, but pain persists.  Patient also has a hx of carpal tunnel syndrome which has been stable, but recently she has been working in her garden which has aggravated pain.  Endorses left elbow pain, decreased ROM, and numbness/tingling left forearm.  Denies fever, chills, sweats, SOB, nausea/vomiting.    Review of Systems Constitutional: negative, per HPI   Objective:    BP 130/92  Pulse 80  Temp(Src) 98.2 F (36.8 C) (Oral)  Ht 5\' 4"  (1.626 m)  Wt 145 lb (65.772 kg)  BMI 24.89 kg/m2 Right elbow: without deformity and full active ROM  Left elbow:  active  ROM  90  degrees flexion, active ROM 170 degrees extension, tenderness over lateral epicondyle and tendon  Left elbow: no deformity, swelling, redness, or bruising  Assessment:    left DJD    Plan:    Rest, ice, compression, and elevation (RICE) therapy. Reduction in offending activity. NSAIDs per medication orders. Follow-up in 6 weeks.

## 2011-07-23 ENCOUNTER — Encounter: Payer: Self-pay | Admitting: Family Medicine

## 2011-07-23 ENCOUNTER — Ambulatory Visit (INDEPENDENT_AMBULATORY_CARE_PROVIDER_SITE_OTHER): Payer: Medicaid Other | Admitting: Family Medicine

## 2011-07-23 VITALS — BP 139/105 | HR 75 | Temp 98.0°F | Ht 65.0 in | Wt 143.6 lb

## 2011-07-23 DIAGNOSIS — S058X9A Other injuries of unspecified eye and orbit, initial encounter: Secondary | ICD-10-CM

## 2011-07-23 DIAGNOSIS — L259 Unspecified contact dermatitis, unspecified cause: Secondary | ICD-10-CM

## 2011-07-23 DIAGNOSIS — L309 Dermatitis, unspecified: Secondary | ICD-10-CM

## 2011-07-23 DIAGNOSIS — S0500XA Injury of conjunctiva and corneal abrasion without foreign body, unspecified eye, initial encounter: Secondary | ICD-10-CM

## 2011-07-23 MED ORDER — TRIAMCINOLONE ACETONIDE 0.1 % EX OINT: TOPICAL_OINTMENT | Freq: Two times a day (BID) | CUTANEOUS | Status: DC

## 2011-07-23 MED ORDER — ERYTHROMYCIN 5 MG/GM OP OINT: TOPICAL_OINTMENT | Freq: Every day | OPHTHALMIC | Status: AC

## 2011-07-23 MED ORDER — ACETAMINOPHEN ER 650 MG PO TBCR: 650.0000 mg | EXTENDED_RELEASE_TABLET | Freq: Three times a day (TID) | ORAL | Status: AC | PRN

## 2011-07-23 NOTE — Progress Notes (Signed)
  Subjective:    Patient ID: Daisy [name], female    DOB: 1956/02/18, 55 y.o.   MRN: [id number]  HPI  Patient presents to clinic with CC: redness of Left eye.  Symptoms started 2 days ago.  Complains of sensation of foreign body in eye.  Patient has tried washing eye out with OTC eye drops at home, with no relief.  Patient complains of blurry vision in affected eye, but denies photophobia, eye pain, or itchiness.  Denies headache, nausea/vomiting, fever, chills, sweats, cough, rhinorrhea.  Of note, patient had her wig cut 2 days ago and is concerned that short hairs may have entered eye.  Additionally, patient had false eyelashes applied to both eyes 2 days ago.  She has had allergic reactions to eyelash glue in the past, but usually affects bilateral eyes and symptoms are mild.  She does not wish to remove eyelashes today because they were expensive.  Review of Systems  Per HPI    Objective:   Physical Exam  Constitutional: She appears well-developed and well-nourished. No distress.  HENT:  Head: Normocephalic.  Right Ear: External ear normal.  Left Ear: External ear normal.  Eyes: EOM and lids are normal. Pupils are equal, round, and reactive to light. No foreign bodies found. Left eye exhibits no chemosis, no discharge, no exudate and no hordeolum. No foreign body present in the left eye. Left conjunctiva is injected. Left conjunctiva has no hemorrhage. No scleral icterus.  Slit lamp exam:      The left eye shows corneal abrasion, fluorescein uptake and an anterior chamber bulge. The left eye shows no foreign body.            Assessment & Plan:

## 2011-07-23 NOTE — Patient Instructions (Signed)
Please pick up prescriptions today and start using eye medication as directed. Please follow up with a [facility] physician in 2 days to make sure eye symptoms are improving.

## 2011-07-23 NOTE — Assessment & Plan Note (Addendum)
No evidence of foreign body in bilateral eyes. Fluorescein exam revealed possible corneal abrasion of left eye.   Patient did not complain of eye pain or photophobia, but did complain of sensation of foreign body in eye. Will treat as if patient has a corneal abrasion.   Will give Rx for erythromycin opthalmic ointment to be applied to affected eye BID (does not wear contact lenses). Will also give patient Rx for high dose Tylenol if eye becomes painful at home.   Patient advised to return to clinic in 2 days for follow up.

## 2011-08-05 ENCOUNTER — Ambulatory Visit: Payer: Self-pay | Admitting: Family Medicine

## 2011-12-26 ENCOUNTER — Other Ambulatory Visit: Payer: Self-pay | Admitting: *Deleted

## 2011-12-26 ENCOUNTER — Other Ambulatory Visit (HOSPITAL_COMMUNITY): Payer: Self-pay | Admitting: *Deleted

## 2011-12-26 DIAGNOSIS — Z1231 Encounter for screening mammogram for malignant neoplasm of breast: Secondary | ICD-10-CM

## 2012-09-16 ENCOUNTER — Ambulatory Visit (HOSPITAL_COMMUNITY)
Admission: RE | Admit: 2012-09-16 | Discharge: 2012-09-16 | Disposition: A | Discharge status: Home / Self Care | Payer: Medicaid Other | Source: Ambulatory Visit | Attending: *Deleted | Admitting: *Deleted

## 2012-09-16 DIAGNOSIS — Z1231 Encounter for screening mammogram for malignant neoplasm of breast: Secondary | ICD-10-CM | POA: Insufficient documentation

## 2013-02-02 ENCOUNTER — Emergency Department (HOSPITAL_COMMUNITY): Payer: No Typology Code available for payment source

## 2013-02-02 ENCOUNTER — Emergency Department (HOSPITAL_COMMUNITY)
Admission: EM | Admit: 2013-02-02 | Discharge: 2013-02-02 | Disposition: A | Discharge status: Home / Self Care | Payer: No Typology Code available for payment source | Attending: Emergency Medicine | Admitting: Emergency Medicine

## 2013-02-02 ENCOUNTER — Encounter (HOSPITAL_COMMUNITY): Payer: Self-pay | Admitting: Emergency Medicine

## 2013-02-02 DIAGNOSIS — S20219A Contusion of unspecified front wall of thorax, initial encounter: Secondary | ICD-10-CM | POA: Insufficient documentation

## 2013-02-02 DIAGNOSIS — S161XXA Strain of muscle, fascia and tendon at neck level, initial encounter: Secondary | ICD-10-CM

## 2013-02-02 DIAGNOSIS — Y9389 Activity, other specified: Secondary | ICD-10-CM | POA: Insufficient documentation

## 2013-02-02 DIAGNOSIS — S139XXA Sprain of joints and ligaments of unspecified parts of neck, initial encounter: Secondary | ICD-10-CM | POA: Insufficient documentation

## 2013-02-02 DIAGNOSIS — Y92410 Unspecified street and highway as the place of occurrence of the external cause: Secondary | ICD-10-CM | POA: Insufficient documentation

## 2013-02-02 DIAGNOSIS — Z79899 Other long term (current) drug therapy: Secondary | ICD-10-CM | POA: Insufficient documentation

## 2013-02-02 MED ORDER — DIAZEPAM 5 MG PO TABS: 5.0000 mg | ORAL_TABLET | Freq: Four times a day (QID) | ORAL | Status: DC | PRN

## 2013-02-02 MED ORDER — IBUPROFEN 600 MG PO TABS: 600.0000 mg | ORAL_TABLET | Freq: Four times a day (QID) | ORAL | Status: DC | PRN

## 2013-02-02 MED ORDER — OXYCODONE-ACETAMINOPHEN 5-325 MG PO TABS: 1.0000 | ORAL_TABLET | Freq: Four times a day (QID) | ORAL | Status: DC | PRN
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
## 2013-07-05 ENCOUNTER — Encounter (HOSPITAL_COMMUNITY): Payer: Self-pay | Admitting: Emergency Medicine

## 2013-07-05 ENCOUNTER — Emergency Department (HOSPITAL_COMMUNITY)
Admission: EM | Admit: 2013-07-05 | Discharge: 2013-07-05 | Disposition: A | Discharge status: Home / Self Care | Payer: Medicaid Other | Attending: Emergency Medicine | Admitting: Emergency Medicine

## 2013-07-05 ENCOUNTER — Emergency Department (HOSPITAL_COMMUNITY): Payer: Medicaid Other

## 2013-07-05 DIAGNOSIS — Z7982 Long term (current) use of aspirin: Secondary | ICD-10-CM | POA: Insufficient documentation

## 2013-07-05 DIAGNOSIS — Z79899 Other long term (current) drug therapy: Secondary | ICD-10-CM | POA: Insufficient documentation

## 2013-07-05 DIAGNOSIS — S43401A Unspecified sprain of right shoulder joint, initial encounter: Secondary | ICD-10-CM

## 2013-07-05 DIAGNOSIS — Y9389 Activity, other specified: Secondary | ICD-10-CM | POA: Insufficient documentation

## 2013-07-05 DIAGNOSIS — Y929 Unspecified place or not applicable: Secondary | ICD-10-CM | POA: Insufficient documentation

## 2013-07-05 DIAGNOSIS — IMO0002 Reserved for concepts with insufficient information to code with codable children: Secondary | ICD-10-CM | POA: Insufficient documentation

## 2013-07-05 DIAGNOSIS — W06XXXA Fall from bed, initial encounter: Secondary | ICD-10-CM | POA: Insufficient documentation

## 2013-07-05 MED ORDER — TRAMADOL HCL 50 MG PO TABS: 50.0000 mg | ORAL_TABLET | Freq: Four times a day (QID) | ORAL | Status: DC | PRN

## 2013-07-05 MED ORDER — NAPROXEN 500 MG PO TABS: 500.0000 mg | ORAL_TABLET | Freq: Two times a day (BID) | ORAL | Status: DC

## 2013-07-05 NOTE — ED Notes (Signed)
Pt states she fell about a month ago. Has had right shoulder to elbow pain. "I got carpal tunnel". States "I numbs up a lot".

## 2013-07-05 NOTE — ED Notes (Signed)
Pt. Stated, I fell on my arm, shoulder about a month ago.  Pain in rt. Arm and shoulder.

## 2013-07-05 NOTE — ED Provider Notes (Signed)
CSN: [id number]     Arrival date & time 07/05/13  1416 History    This chart was scribed for a non-physician practitioner, Jaynie Crumble, PA-C, working with Claudean Kinds, MD by Frederik Pear, ED Scribe. This patient was seen in room TR04C/TR04C and the patient's care was started at 1502.   First MD Initiated Contact with Patient 07/05/13 1502     Chief Complaint  Patient presents with  . Arm Injury   (Consider location/radiation/quality/duration/timing/severity/associated sxs/prior Treatment) The history is provided by the patient and medical records. No language interpreter was used.   HPI Comments: Daisy [name] is a 57 y.o. female who presents to the Emergency Department complaining of constant right shoulder and arm pain that is aggravated with abduction and sleeping on the area the began 1 month ago when she fell out of her bed while she was asleep and landed on her right arm. She denies having seen a provider since the fall. She reports she has been treating the pain with Aleve, which has provided no relief, but denies having iced or applying heat to the area. She denies having a PCP or being established with an orthopedist.  History reviewed. No pertinent past medical history. History reviewed. No pertinent past surgical history. No family history on file. History  Substance Use Topics  . Smoking status: Never Smoker   . Smokeless tobacco: Not on file  . Alcohol Use: No   OB History   Grav Para Term Preterm Abortions TAB SAB Ect Mult Living                 Review of Systems  Musculoskeletal: Positive for myalgias (right arm) and arthralgias (right shoulder).  All other systems reviewed and are negative.   Allergies  Codeine  Home Medications   Current Outpatient Rx  Name  Route  Sig  Dispense  Refill  . aspirin EC 81 MG tablet   Oral   Take 81 mg by mouth daily.         . cyanocobalamin 500 MCG tablet   Oral   Take 500 mcg by mouth daily.          . hydrochlorothiazide 25 MG tablet   Oral   Take 1 tablet (25 mg total) by mouth daily.   30 tablet   1    BP 130/89  Pulse 81  Temp(Src) 98.6 F (37 C) (Oral)  Resp 16  SpO2 99% Physical Exam  Nursing note and vitals reviewed. Constitutional: She is oriented to person, place, and time. She appears well-developed and well-nourished. No distress.  HENT:  Head: Normocephalic and atraumatic.  Eyes: EOM are normal.  Neck: Neck supple. No tracheal deviation present.  Cardiovascular: Normal rate and intact distal pulses.   Normal distal radial pulses.   Pulmonary/Chest: Effort normal. No respiratory distress.  Musculoskeletal: Normal range of motion. She exhibits tenderness.  Tender over right trapezius. Tenderness over anterior and posterior right shoulder. Full ROM of the elbow. Decreased ROM of the right shoulder secondary to pain. Unable to externally rotate. Active right shoulder flexion about 20 degrees.   Neurological: She is alert and oriented to person, place, and time.  Skin: Skin is warm and dry.  Psychiatric: She has a normal mood and affect. Her behavior is normal. Judgment and thought content normal.    ED Course   Procedures (including critical care time)  DIAGNOSTIC STUDIES: Oxygen Saturation is 99% on room air, normal by my interpretation.  COORDINATION OF CARE:  15:17- Discussed planned course of treatment with the patient, including a right shoulder X-ray, who is agreeable at this time.  16:09- Pt's X-ray is normal. Discussed discharging the her with a sling and a follow up to orthopedics. She is agreeable at this time.   Labs Reviewed - No data to display Dg Shoulder Right  07/05/2013   *RADIOLOGY REPORT*  Clinical Data: Right shoulder pain status post fall 1 month ago.  RIGHT SHOULDER - 2+ VIEW  Comparison: Chest radiograph 02/02/2013  Findings: Normal anatomic alignment.  No evidence for acute fracture or dislocation.  Visualized right hemithorax is  unremarkable.  IMPRESSION: No evidence for acute fracture or dislocation.   Original Report Authenticated By: Annia Belt, M.D   1. Shoulder sprain, right, initial encounter     MDM  Pt with right shoulder injury after falling out of bed almost a month ago. She has limmited ROM on exam due to pain. X-ray negative. Possible rotator cuff injury. Sling given for comfort. Naprosyn and ultram for pain. Follow up with orthopedics. She is neurovascularly intact. No signs of infection.   Filed Vitals:   07/05/13 1429  BP: 130/89  Pulse: 81  Temp: 98.6 F (37 C)  TempSrc: Oral  Resp: 16  SpO2: 99%    I personally performed the services described in this documentation, which was scribed in my presence. The recorded information has been reviewed and is accurate.   Lottie Mussel, PA-C 07/05/13 1620

## 2013-07-09 NOTE — ED Provider Notes (Signed)
Medical screening examination/treatment/procedure(s) were performed by non-physician practitioner and as supervising physician I was immediately available for consultation/collaboration.   Shahd Occhipinti Joseph Meldon Hanzlik, MD 07/09/13 1103 

## 2015-01-11 ENCOUNTER — Other Ambulatory Visit: Payer: Self-pay | Admitting: Physician Assistant

## 2015-01-11 DIAGNOSIS — R10814 Left lower quadrant abdominal tenderness: Secondary | ICD-10-CM

## 2015-01-11 DIAGNOSIS — R109 Unspecified abdominal pain: Secondary | ICD-10-CM

## 2015-01-17 ENCOUNTER — Other Ambulatory Visit: Payer: Self-pay | Admitting: Physician Assistant

## 2015-01-17 ENCOUNTER — Ambulatory Visit
Admission: RE | Admit: 2015-01-17 | Discharge: 2015-01-17 | Disposition: A | Discharge status: Home / Self Care | Payer: Medicaid Other | Source: Ambulatory Visit | Attending: Physician Assistant | Admitting: Physician Assistant

## 2015-01-17 DIAGNOSIS — R10814 Left lower quadrant abdominal tenderness: Secondary | ICD-10-CM

## 2015-01-17 DIAGNOSIS — R109 Unspecified abdominal pain: Secondary | ICD-10-CM

## 2017-01-13 ENCOUNTER — Other Ambulatory Visit: Payer: Self-pay | Admitting: Physician Assistant

## 2017-01-13 DIAGNOSIS — Z1231 Encounter for screening mammogram for malignant neoplasm of breast: Secondary | ICD-10-CM

## 2017-01-28 ENCOUNTER — Ambulatory Visit: Payer: Medicaid Other

## 2017-08-26 ENCOUNTER — Emergency Department (HOSPITAL_COMMUNITY): Payer: Medicaid Other

## 2017-08-26 ENCOUNTER — Emergency Department (HOSPITAL_COMMUNITY)
Admission: EM | Admit: 2017-08-26 | Discharge: 2017-08-26 | Disposition: A | Discharge status: Home / Self Care | Payer: Medicaid Other | Attending: Emergency Medicine | Admitting: Emergency Medicine

## 2017-08-26 ENCOUNTER — Encounter (HOSPITAL_COMMUNITY): Payer: Self-pay

## 2017-08-26 DIAGNOSIS — W108XXA Fall (on) (from) other stairs and steps, initial encounter: Secondary | ICD-10-CM | POA: Diagnosis not present

## 2017-08-26 DIAGNOSIS — W19XXXA Unspecified fall, initial encounter: Secondary | ICD-10-CM

## 2017-08-26 DIAGNOSIS — Z7982 Long term (current) use of aspirin: Secondary | ICD-10-CM | POA: Diagnosis not present

## 2017-08-26 DIAGNOSIS — Y999 Unspecified external cause status: Secondary | ICD-10-CM | POA: Diagnosis not present

## 2017-08-26 DIAGNOSIS — Y929 Unspecified place or not applicable: Secondary | ICD-10-CM | POA: Diagnosis not present

## 2017-08-26 DIAGNOSIS — R0781 Pleurodynia: Secondary | ICD-10-CM | POA: Diagnosis not present

## 2017-08-26 DIAGNOSIS — Z79899 Other long term (current) drug therapy: Secondary | ICD-10-CM | POA: Diagnosis not present

## 2017-08-26 DIAGNOSIS — Y9389 Activity, other specified: Secondary | ICD-10-CM | POA: Diagnosis not present

## 2017-08-26 MED ORDER — IBUPROFEN 200 MG PO TABS
600.0000 mg | ORAL_TABLET | Freq: Once | ORAL | Status: AC
  Administered 2017-08-26: 600 mg via ORAL
  Filled 2017-08-26: qty 1

## 2017-08-26 MED ORDER — TRAMADOL HCL 50 MG PO TABS: 50.0000 mg | ORAL_TABLET | Freq: Four times a day (QID) | ORAL | 0 refills | Status: DC | PRN

## 2017-08-26 NOTE — ED Notes (Signed)
Pt able to ambulate from wheelchair to bed with no difficulty.

## 2017-08-26 NOTE — Discharge Instructions (Signed)
Please read attached information. If you experience any new or worsening signs or symptoms please return to the emergency room for evaluation. Please follow-up with your primary care provider or specialist as discussed. Please use medication prescribed only as directed and discontinue taking if you have any concerning signs or symptoms.   °

## 2017-08-26 NOTE — ED Provider Notes (Signed)
[facility]-EMERGENCY DEPT Provider Note   CSN: [id number] Arrival date & time: 08/26/17  1012     History   Chief Complaint Chief Complaint  Patient presents with  . Fall    HPI Daisy [name] is a 61 y.o. female.  HPI   61 year old female presents status post fall.  Patient reports she was standing on a stool when it fell out causing her to fall and landed on her right posterior ribs.  She notes immediate pain, EMS was called she was unable to ambulate due to discomfort.  At the time of evaluation patient reports improvement in her pain.  She is able to ambulate but has back pain.  She denies any loss of consciousness, other injuries.  She denies any shortness of breath, abdominal pain, loss of distal sensation strength or motor function.  No medications prior to arrival.   History reviewed. No pertinent past medical history.  Patient Active Problem List   Diagnosis Date Noted  . Corneal abrasion 07/23/2011  . Elbow pain, left 06/30/2011  . Sore throat 02/12/2011  . HYPOKALEMIA, MILD 11/04/2008  . CARPAL TUNNEL SYNDROME 04/30/2007  . HYPERTENSION, BENIGN SYSTEMIC 01/22/2007  . RHINITIS, ALLERGIC 01/22/2007  . MENOPAUSAL SYNDROME 01/22/2007    History reviewed. No pertinent surgical history.  OB History    No data available       Home Medications    Prior to Admission medications   Medication Sig Start Date End Date Taking? Authorizing Provider  aspirin EC 81 MG tablet Take 81 mg by mouth daily.    [provider]  cyanocobalamin 500 MCG tablet Take 500 mcg by mouth daily.    [provider]  hydrochlorothiazide 25 MG tablet Take 1 tablet (25 mg total) by mouth daily. 06/25/11   de Lawson Radar, Ivy, DO  naproxen (NAPROSYN) 500 MG tablet Take 1 tablet (500 mg total) by mouth 2 (two) times daily. 07/05/13   Kirichenko, Lemont Fillers, PA-C  traMADol (ULTRAM) 50 MG tablet Take 1 tablet (50 mg total) by mouth every 6 (six) hours as needed. 08/26/17   Eyvonne Mechanic,  PA-C    Family History No family history on file.  Social History Social History  Substance Use Topics  . Smoking status: Never Smoker  . Smokeless tobacco: Not on file  . Alcohol use No     Allergies   Codeine   Review of Systems Review of Systems  All other systems reviewed and are negative.    Physical Exam Updated Vital Signs BP (!) 153/82   Pulse 70   Temp 98.2 F (36.8 C) (Oral)   Resp 18   Ht  (1.626 m)   Wt 68.5 kg (151 lb)   SpO2 98%   BMI 25.92 kg/m   Physical Exam  Constitutional: She is oriented to person, place, and time. She appears well-developed and well-nourished.  HENT:  Head: Normocephalic and atraumatic.  Eyes: Pupils are equal, round, and reactive to light. Conjunctivae are normal. Right eye exhibits no discharge. Left eye exhibits no discharge. No scleral icterus.  Neck: Normal range of motion. No JVD present. No tracheal deviation present.  Pulmonary/Chest: Effort normal and breath sounds normal. No stridor. No respiratory distress. She has no wheezes. She has no rales. She exhibits tenderness.  Exquisite tenderness to the right posterior lower ribs, no bruising, no obvious deformity, lung expansion is normal with clear lung sounds.  Abdominal: Soft. She exhibits no distension and no mass. There is no tenderness. There  is no rebound and no guarding. No hernia.  Musculoskeletal:  No CT or L-spine tenderness.  Minor tenderness palpation of the right lateral lumbar musculature-hips are stable, no pain with AP or lateral compression-distal sensation strength and motor function intact  Neurological: She is alert and oriented to person, place, and time. Coordination normal.  Psychiatric: She has a normal mood and affect. Her behavior is normal. Judgment and thought content normal.  Nursing note and vitals reviewed.    ED Treatments / Results  Labs (all labs ordered are listed, but only abnormal results are displayed) Labs Reviewed - No  data to display  EKG  EKG Interpretation None       Radiology Dg Ribs Unilateral W/chest Right  Result Date: 08/26/2017 CLINICAL DATA:  Pt presents for evaluation of mid to lower back pain following mechanical fall today. Pt reports was reaching for something behind shelf when she fell back and landed on a chair. Pain on right ribs and lower back. EXAM: RIGHT RIBS AND CHEST - 3+ VIEW COMPARISON:  None. FINDINGS: No fracture or other bone lesions are seen involving the ribs. There is no evidence of pneumothorax or pleural effusion. Both lungs are clear. Heart size and mediastinal contours are within normal limits. IMPRESSION: Negative. Electronically Signed   By: Elige Ko   On: 08/26/2017 13:16   Dg Lumbar Spine Complete  Result Date: 08/26/2017 CLINICAL DATA:  Low back pain following a mechanical fall. EXAM: LUMBAR SPINE - COMPLETE 4+ VIEW COMPARISON:  None. FINDINGS: There is no evidence of lumbar spine fracture. Alignment is normal. Intervertebral disc spaces are maintained. Minor anterior osseous spurring. Cholecystectomy clips. Degenerative change in both hips. No lower rib fracture is evident. IMPRESSION: Negative. Electronically Signed   By: Elsie Stain M.D.   On: 08/26/2017 13:18    Procedures Procedures (including critical care time)  Medications Ordered in ED Medications  ibuprofen (ADVIL,MOTRIN) tablet 600 mg (600 mg Oral Given 08/26/17 1227)     Initial Impression / Assessment and Plan / ED Course  I have reviewed the triage vital signs and the nursing notes.  Pertinent labs & imaging results that were available during my care of the patient were reviewed by me and considered in my medical decision making (see chart for details).      Final Clinical Impressions(s) / ED Diagnoses   Final diagnoses:  Fall, initial encounter  Rib pain   Labs:   Imaging: DG Ribs unilateral right, DG lumbar spine complete  Consults:  Therapeutics: Ibuprofen  Discharge  Meds: Ultram  Assessment/Plan: 61 year old female presents status post fall.  She has posterior rib pain.  Patient with improving symptoms by the time she arrived here in the ED.  She is ambulatory with no acute distress.  She has tenderness along the ribs, ibuprofen was given, plain films ordered.  Upon second evaluation patient's pain significantly improved, she is moving around in the exam bed without discomfort, can ambulate without significant discomfort, and has full lung expansion.  She has negative plain films.  I discussed the chance of missing a fracture on plain films and discussed obtaining a CT.  Patient reports symptoms have improved, she will monitor this at home and return if symptoms do not improve or worsen.  No signs of pneumothorax or any significant intrathoracic intra-abdominal pathology.  Both patient and her husband verbalized understanding and agreement to today's plan had no further questions or concerns the time discharge.    New Prescriptions New Prescriptions  TRAMADOL (ULTRAM) 50 MG TABLET    Take 1 tablet (50 mg total) by mouth every 6 (six) hours as needed.     Eyvonne Mechanic, PA-C 08/26/17 1410    Charlynne Pander, MD 08/27/17 [phone]

## 2017-08-26 NOTE — ED Triage Notes (Signed)
Pt presents for evaluation of mid to lower back pain following mechanical fall today. Pt reports was reaching for something behind shelf when she fell back and landed on a chair. Pt reports unable to ambulate. Pt is able to move all extremities with ease.

## 2017-11-24 ENCOUNTER — Emergency Department (HOSPITAL_COMMUNITY): Payer: Medicaid Other

## 2017-11-24 ENCOUNTER — Encounter (HOSPITAL_COMMUNITY): Payer: Self-pay

## 2017-11-24 ENCOUNTER — Other Ambulatory Visit: Payer: Self-pay

## 2017-11-24 ENCOUNTER — Emergency Department (HOSPITAL_COMMUNITY)
Admission: EM | Admit: 2017-11-24 | Discharge: 2017-11-25 | Disposition: A | Discharge status: Home / Self Care | Payer: Medicaid Other | Attending: Emergency Medicine | Admitting: Emergency Medicine

## 2017-11-24 DIAGNOSIS — R072 Precordial pain: Secondary | ICD-10-CM | POA: Insufficient documentation

## 2017-11-24 DIAGNOSIS — Z79899 Other long term (current) drug therapy: Secondary | ICD-10-CM | POA: Insufficient documentation

## 2017-11-24 DIAGNOSIS — I10 Essential (primary) hypertension: Secondary | ICD-10-CM | POA: Insufficient documentation

## 2017-11-24 DIAGNOSIS — R079 Chest pain, unspecified: Secondary | ICD-10-CM | POA: Diagnosis present

## 2017-11-24 LAB — BASIC METABOLIC PANEL
ANION GAP: 7 (ref 5–15)
BUN: 7 mg/dL (ref 6–20)
CALCIUM: 9.3 mg/dL (ref 8.9–10.3)
CO2: 27 mmol/L (ref 22–32)
Chloride: 102 mmol/L (ref 101–111)
Creatinine, Ser: 0.85 mg/dL (ref 0.44–1.00)
GFR calc Af Amer: >60 mL/min (ref >60)
Glucose, Bld: 114 mg/dL — ABNORMAL HIGH (ref 65–99)
Potassium: 3 mmol/L — ABNORMAL LOW (ref 3.5–5.1)
Sodium: 136 mmol/L (ref 135–145)

## 2017-11-24 LAB — I-STAT TROPONIN, ED
TROPONIN I, POC: 0 ng/mL (ref 0.00–0.08)
Troponin i, poc: 0 ng/mL (ref 0.00–0.08)

## 2017-11-24 LAB — CBC
HCT: 36 % (ref 36.0–46.0)
HEMOGLOBIN: 12.4 g/dL (ref 12.0–15.0)
MCH: 30.1 pg (ref 26.0–34.0)
MCHC: 34.4 g/dL (ref 30.0–36.0)
MCV: 87.4 fL (ref 78.0–100.0)
Platelets: 285 10*3/uL (ref 150–400)
RBC: 4.12 MIL/uL (ref 3.87–5.11)
RDW: 14.6 % (ref 11.5–15.5)
WBC: 8.7 10*3/uL (ref 4.0–10.5)

## 2017-11-24 LAB — I-STAT BETA HCG BLOOD, ED (MC, WL, AP ONLY): I-stat hCG, quantitative: <5 m[IU]/mL (ref <5)

## 2017-11-24 MED ORDER — POTASSIUM CHLORIDE CRYS ER 20 MEQ PO TBCR
40.0000 meq | EXTENDED_RELEASE_TABLET | Freq: Once | ORAL | Status: AC
  Administered 2017-11-24: 40 meq via ORAL
  Filled 2017-11-24 (×2): qty 2

## 2017-11-24 MED ORDER — GI COCKTAIL ~~LOC~~
30.0000 mL | Freq: Once | ORAL | Status: AC
  Administered 2017-11-24: 30 mL via ORAL
  Filled 2017-11-24: qty 30

## 2017-11-24 MED ORDER — SIMETHICONE 40 MG/0.6ML PO SUSP (UNIT DOSE)
40.0000 mg | Freq: Once | ORAL | Status: AC
  Administered 2017-11-24: 40 mg via ORAL
  Filled 2017-11-24: qty 0.6

## 2017-11-24 NOTE — ED Notes (Signed)
ED Provider at bedside. 

## 2017-11-24 NOTE — ED Triage Notes (Signed)
Pt presents for evaluation of central cp with sob starting this AM. Pt denies radiation.

## 2017-11-25 MED ORDER — SIMETHICONE 40 MG/0.6ML PO SUSP: 40.0000 mg | Freq: Four times a day (QID) | ORAL | 0 refills | Status: DC | PRN

## 2017-11-25 MED ORDER — POLYETHYLENE GLYCOL 3350 17 G PO PACK: 17.0000 g | PACK | Freq: Every day | ORAL | 0 refills | Status: DC

## 2017-11-25 NOTE — ED Provider Notes (Signed)
[facility] EMERGENCY DEPARTMENT Provider Note   CSN: [id number] Arrival date & time: 11/24/17  1318     History   Chief Complaint Chief Complaint  Patient presents with  . Chest Pain    HPI Daisy [name] is a 62 y.o. female.  The history is provided by the patient.  Chest Pain   This is a new problem. The current episode started 12 to 24 hours ago. Episode frequency: lasting seconds  The pain is associated with rest. The pain is present in the lateral region. The pain is mild. The quality of the pain is described as dull. The pain does not radiate. Exacerbated by: constipation. Pertinent negatives include no abdominal pain, no diaphoresis, no hemoptysis, no nausea, no palpitations, no PND, no shortness of breath and no vomiting. She has tried nothing for the symptoms. The treatment provided no relief. Risk factors: HTN.  Pertinent negatives for past medical history include no aneurysm.  Pertinent negatives for family medical history include: no early MI.  Procedure history is negative for EPS study.    History reviewed. No pertinent past medical history.  Patient Active Problem List   Diagnosis Date Noted  . Corneal abrasion 07/23/2011  . Elbow pain, left 06/30/2011  . Sore throat 02/12/2011  . HYPOKALEMIA, MILD 11/04/2008  . CARPAL TUNNEL SYNDROME 04/30/2007  . HYPERTENSION, BENIGN SYSTEMIC 01/22/2007  . RHINITIS, ALLERGIC 01/22/2007  . MENOPAUSAL SYNDROME 01/22/2007    History reviewed. No pertinent surgical history.  OB History    No data available       Home Medications    Prior to Admission medications   Medication Sig Start Date End Date Taking? Authorizing Provider  hydrochlorothiazide 25 MG tablet Take 1 tablet (25 mg total) by mouth daily. 06/25/11  Yes de Lawson Radar, Ivy, DO  predniSONE (DELTASONE) 5 MG tablet Take 5 mg by mouth daily with breakfast.   Yes [provider]  naproxen (NAPROSYN) 500 MG tablet Take 1 tablet (500  mg total) by mouth 2 (two) times daily. Patient not taking: Reported on 11/24/2017 07/05/13   Jaynie Crumble, PA-C  traMADol (ULTRAM) 50 MG tablet Take 1 tablet (50 mg total) by mouth every 6 (six) hours as needed. Patient not taking: Reported on 11/24/2017 08/26/17   Eyvonne Mechanic, PA-C    Family History No family history on file.  Social History Social History   Tobacco Use  . Smoking status: Never Smoker  Substance Use Topics  . Alcohol use: No  . Drug use: No     Allergies   Codeine   Review of Systems Review of Systems  Constitutional: Negative for diaphoresis.  Respiratory: Negative for hemoptysis and shortness of breath.   Cardiovascular: Positive for chest pain. Negative for palpitations, leg swelling and PND.  Gastrointestinal: Negative for abdominal pain, nausea and vomiting.  All other systems reviewed and are negative.    Physical Exam Updated Vital Signs BP (!) 165/98   Pulse 79   Resp 13   SpO2 100%   Physical Exam  Constitutional: She is oriented to person, place, and time. She appears well-developed and well-nourished.  HENT:  Head: Normocephalic and atraumatic.  Eyes: Conjunctivae are normal. Pupils are equal, round, and reactive to light.  Neck: Normal range of motion. Neck supple. No JVD present.  Cardiovascular: Normal rate, regular rhythm, normal heart sounds and intact distal pulses.  Pulmonary/Chest: Effort normal and breath sounds normal. No stridor. No respiratory distress. She has no wheezes. She  has no rales.  Abdominal: Soft. Bowel sounds are normal. She exhibits no mass. There is no tenderness. There is no rebound and no guarding.  Musculoskeletal: Normal range of motion.  Neurological: She is alert and oriented to person, place, and time. She displays normal reflexes.  Skin: Skin is warm and dry. Capillary refill takes less than 2 seconds. She is not diaphoretic.  Psychiatric: She has a normal mood and affect.     ED  Treatments / Results  Labs (all labs ordered are listed, but only abnormal results are displayed)  Results for orders placed or performed during the hospital encounter of 11/24/17  Basic metabolic panel  Result Value Ref Range   Sodium 136 135 - 145 mmol/L   Potassium 3.0 (L) 3.5 - 5.1 mmol/L   Chloride 102 101 - 111 mmol/L   CO2 27 22 - 32 mmol/L   Glucose, Bld 114 (H) 65 - 99 mg/dL   BUN 7 6 - 20 mg/dL   Creatinine, Ser 4.09 0.44 - 1.00 mg/dL   Calcium 9.3 8.9 - 81.1 mg/dL   GFR calc non Af Amer >60 >60 mL/min   GFR calc Af Amer >60 >60 mL/min   Anion gap 7 5 - 15  CBC  Result Value Ref Range   WBC 8.7 4.0 - 10.5 K/uL   RBC 4.12 3.87 - 5.11 MIL/uL   Hemoglobin 12.4 12.0 - 15.0 g/dL   HCT 91.4 78.2 - 95.6 %   MCV 87.4 78.0 - 100.0 fL   MCH 30.1 26.0 - 34.0 pg   MCHC 34.4 30.0 - 36.0 g/dL   RDW 21.3 08.6 - 57.8 %   Platelets 285 150 - 400 K/uL  I-stat troponin, ED  Result Value Ref Range   Troponin i, poc 0.00 0.00 - 0.08 ng/mL   Comment 3          I-Stat beta hCG blood, ED  Result Value Ref Range   I-stat hCG, quantitative <5.0 <5 mIU/mL   Comment 3          I-stat troponin, ED  Result Value Ref Range   Troponin i, poc 0.00 0.00 - 0.08 ng/mL   Comment 3           Dg Chest 2 View  Result Date: 11/24/2017 CLINICAL DATA:  Left-sided chest pain for 1 day. EXAM: CHEST  2 VIEW COMPARISON:  02/02/2013 FINDINGS: The heart size and mediastinal contours are within normal limits. Both lungs are clear. Mild thoracic spine degenerative changes again seen. IMPRESSION: Stable exam.  No active cardiopulmonary disease. Electronically Signed   By: Myles Rosenthal M.D.   On: 11/24/2017 14:18    EKG  EKG Interpretation  Date/Time:  Monday November 24 2017 13:31:26 EST Ventricular Rate:  78 PR Interval:  168 QRS Duration: 154 QT Interval:  438 QTC Calculation: 499 R Axis:   45 Text Interpretation:  Normal sinus rhythm Right bundle branch block Abnormal ECG No old tracing to compare  Confirmed by Pricilla Loveless [id number]) on 11/24/2017 9:42:54 PM       Radiology Dg Chest 2 View  Result Date: 11/24/2017 CLINICAL DATA:  Left-sided chest pain for 1 day. EXAM: CHEST  2 VIEW COMPARISON:  02/02/2013 FINDINGS: The heart size and mediastinal contours are within normal limits. Both lungs are clear. Mild thoracic spine degenerative changes again seen. IMPRESSION: Stable exam.  No active cardiopulmonary disease. Electronically Signed   By: Myles Rosenthal M.D.   On: 11/24/2017 14:18  Procedures Procedures (including critical care time)  Medications Ordered in ED Medications  potassium chloride SA (K-DUR,KLOR-CON) CR tablet 40 mEq (40 mEq Oral Given 11/24/17 2311)  gi cocktail (Maalox,Lidocaine,Donnatal) (30 mLs Oral Given 11/24/17 2326)  simethicone (MYLICON) 40 mg/0.516ml suspension 40 mg (40 mg Oral Given 11/24/17 2348)      Final Clinical Impressions(s) / ED Diagnoses   Ruled out for MI in the ED.  Heart score is 1 low risk for MACE.  Story is consistent with gas.  Feeling better post medication.    Return for worsening pain,  fevers > 100.4 unrelieved by medication, weakness or numbness,  intractable vomiting, or diarrhea, abdominal pain, Inability to tolerate liquids or food, cough, altered mental status or any concerns. No signs of systemic illness or infection. The patient is nontoxic-appearing on exam and vital signs are within normal limits.    I have reviewed the triage vital signs and the nursing notes. Pertinent labs &imaging results that were available during my care of the patient were reviewed by me and considered in my medical decision making (see chart for details).  After history, exam, and medical workup I feel the patient has been appropriately medically screened and is safe for discharge home. Pertinent diagnoses were discussed with the patient. Patient was given return precautions   Jashay Roddy, MD 11/25/17 0005

## 2018-06-29 ENCOUNTER — Other Ambulatory Visit: Payer: Self-pay | Admitting: Physician Assistant

## 2018-06-29 DIAGNOSIS — Z129 Encounter for screening for malignant neoplasm, site unspecified: Secondary | ICD-10-CM

## 2018-09-21 IMAGING — CR DG LUMBAR SPINE COMPLETE 4+V
5 series · 5 of 5 positions shown · non-contrast
Comparison: None.

CLINICAL DATA: Low back pain following a mechanical fall.

EXAM:
LUMBAR SPINE - COMPLETE 4+ VIEW

[l-spine ap]
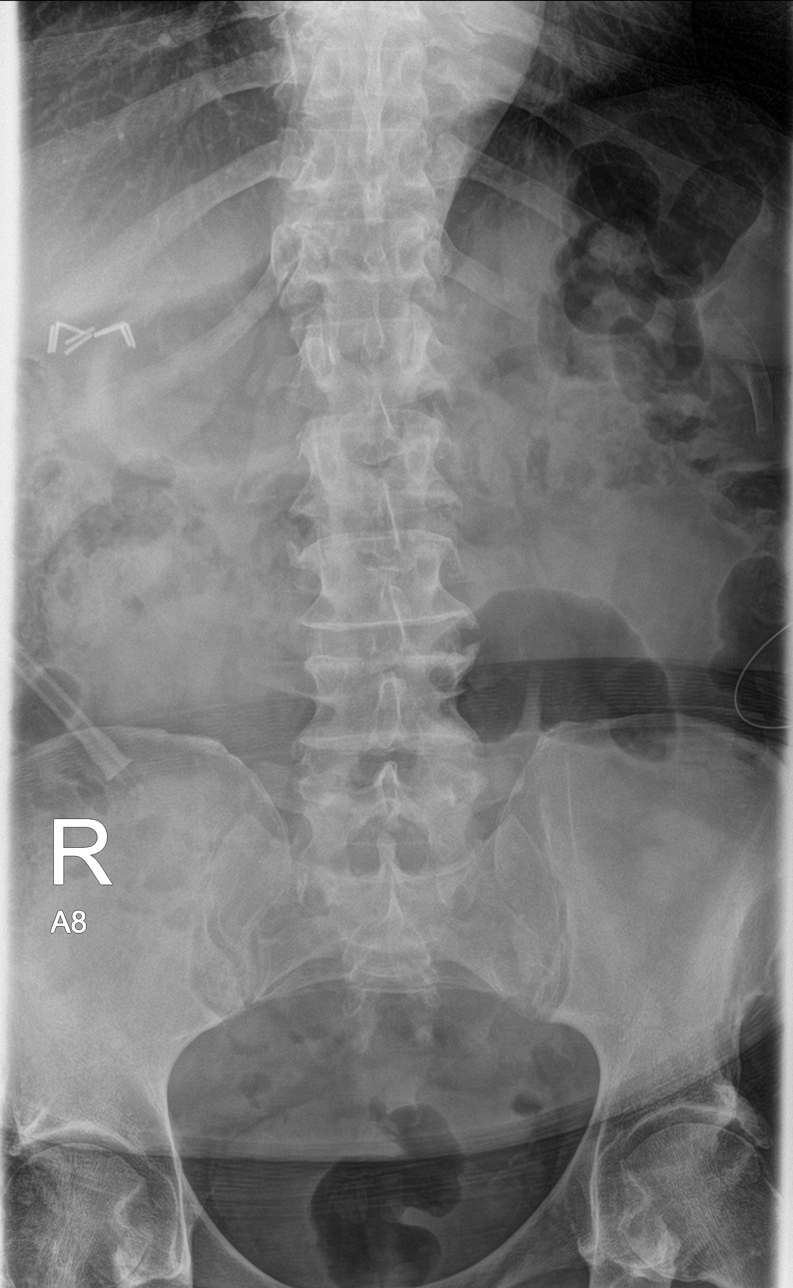

[l-spine obl (1 of 2)]
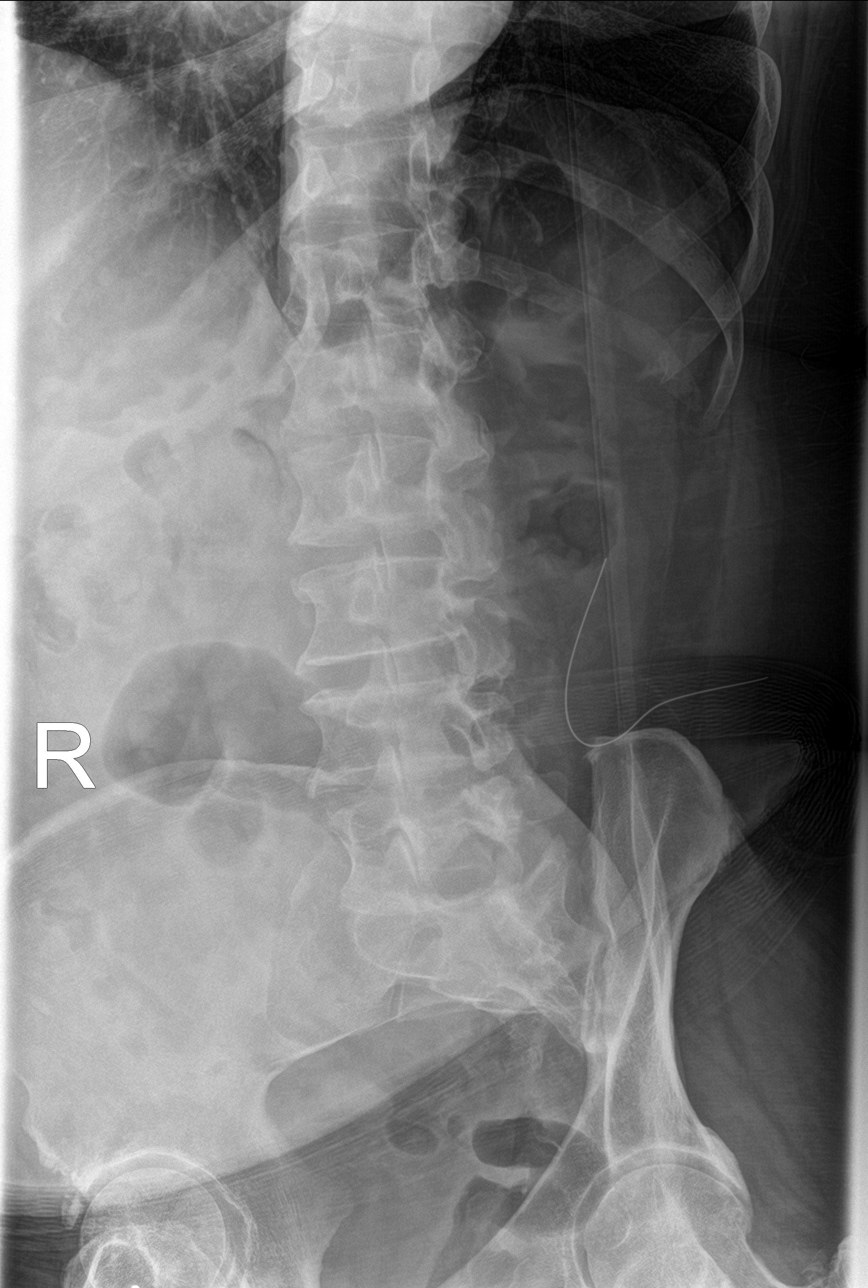

[l-spine obl (2 of 2)]
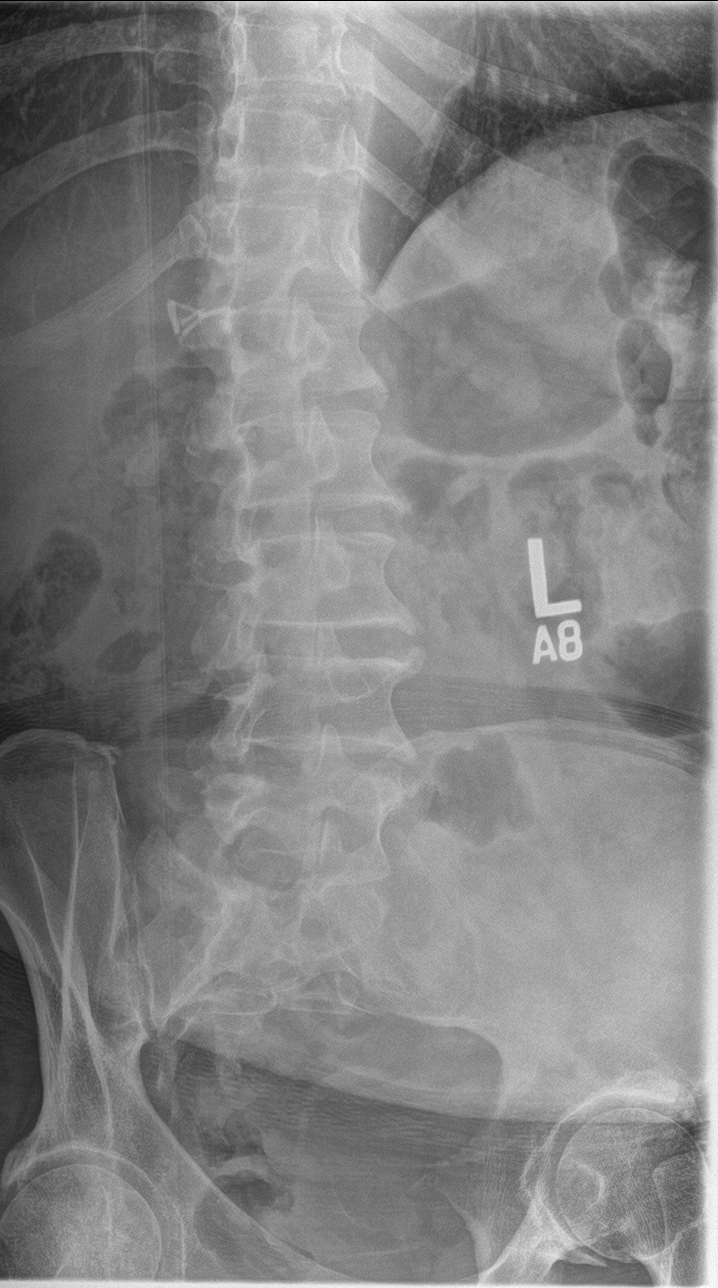

[l-spine lat]
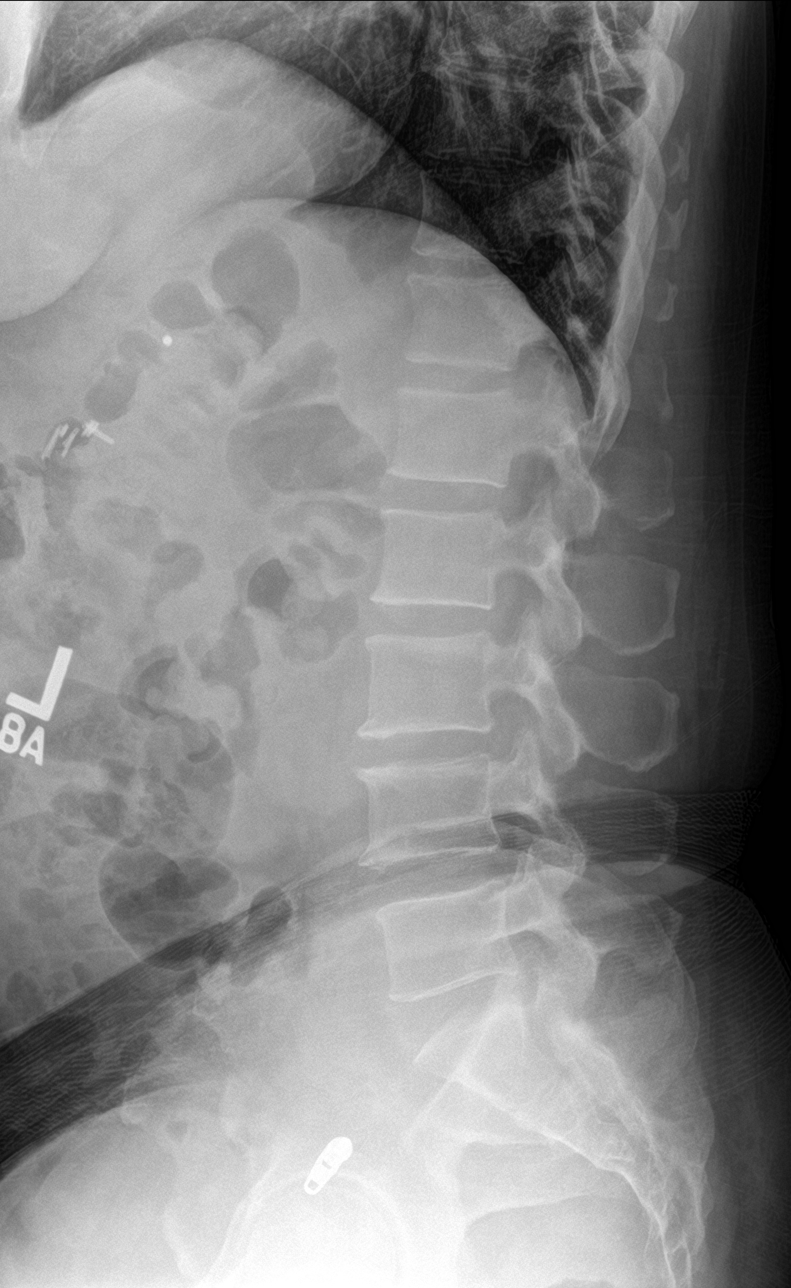

[l-spine spot]
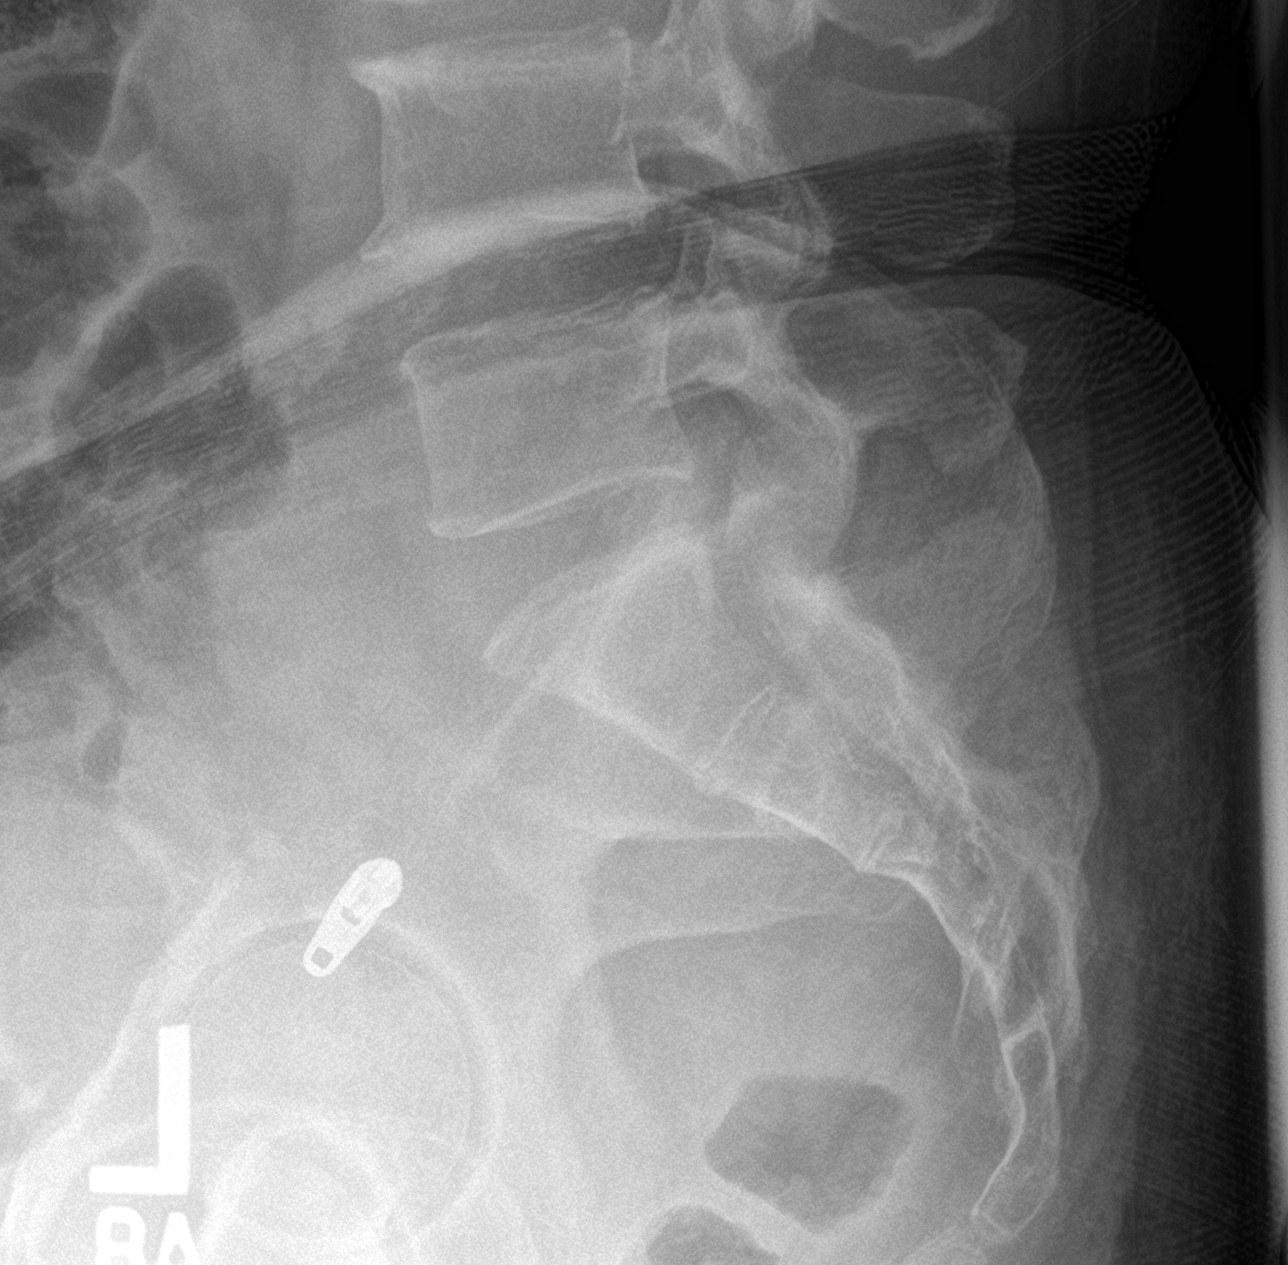

[5 of 5 positions shown; findings below may reference images not displayed]

FINDINGS: There is no evidence of lumbar spine fracture. Alignment is normal.
Intervertebral disc spaces are maintained. Minor anterior osseous
spurring. Cholecystectomy clips. Degenerative change in both hips.
No lower rib fracture is evident.
IMPRESSION: Negative.

## 2018-11-11 ENCOUNTER — Other Ambulatory Visit: Payer: Self-pay | Admitting: Physician Assistant

## 2018-11-17 ENCOUNTER — Other Ambulatory Visit: Payer: Self-pay | Admitting: Physician Assistant

## 2018-11-17 DIAGNOSIS — R0789 Other chest pain: Secondary | ICD-10-CM

## 2018-12-20 IMAGING — CR DG CHEST 2V
2 series · 2 of 2 positions shown · non-contrast
Comparison: 02/02/2013

CLINICAL DATA: Left-sided chest pain for 1 day.

EXAM:
CHEST  2 VIEW

[chest pa]
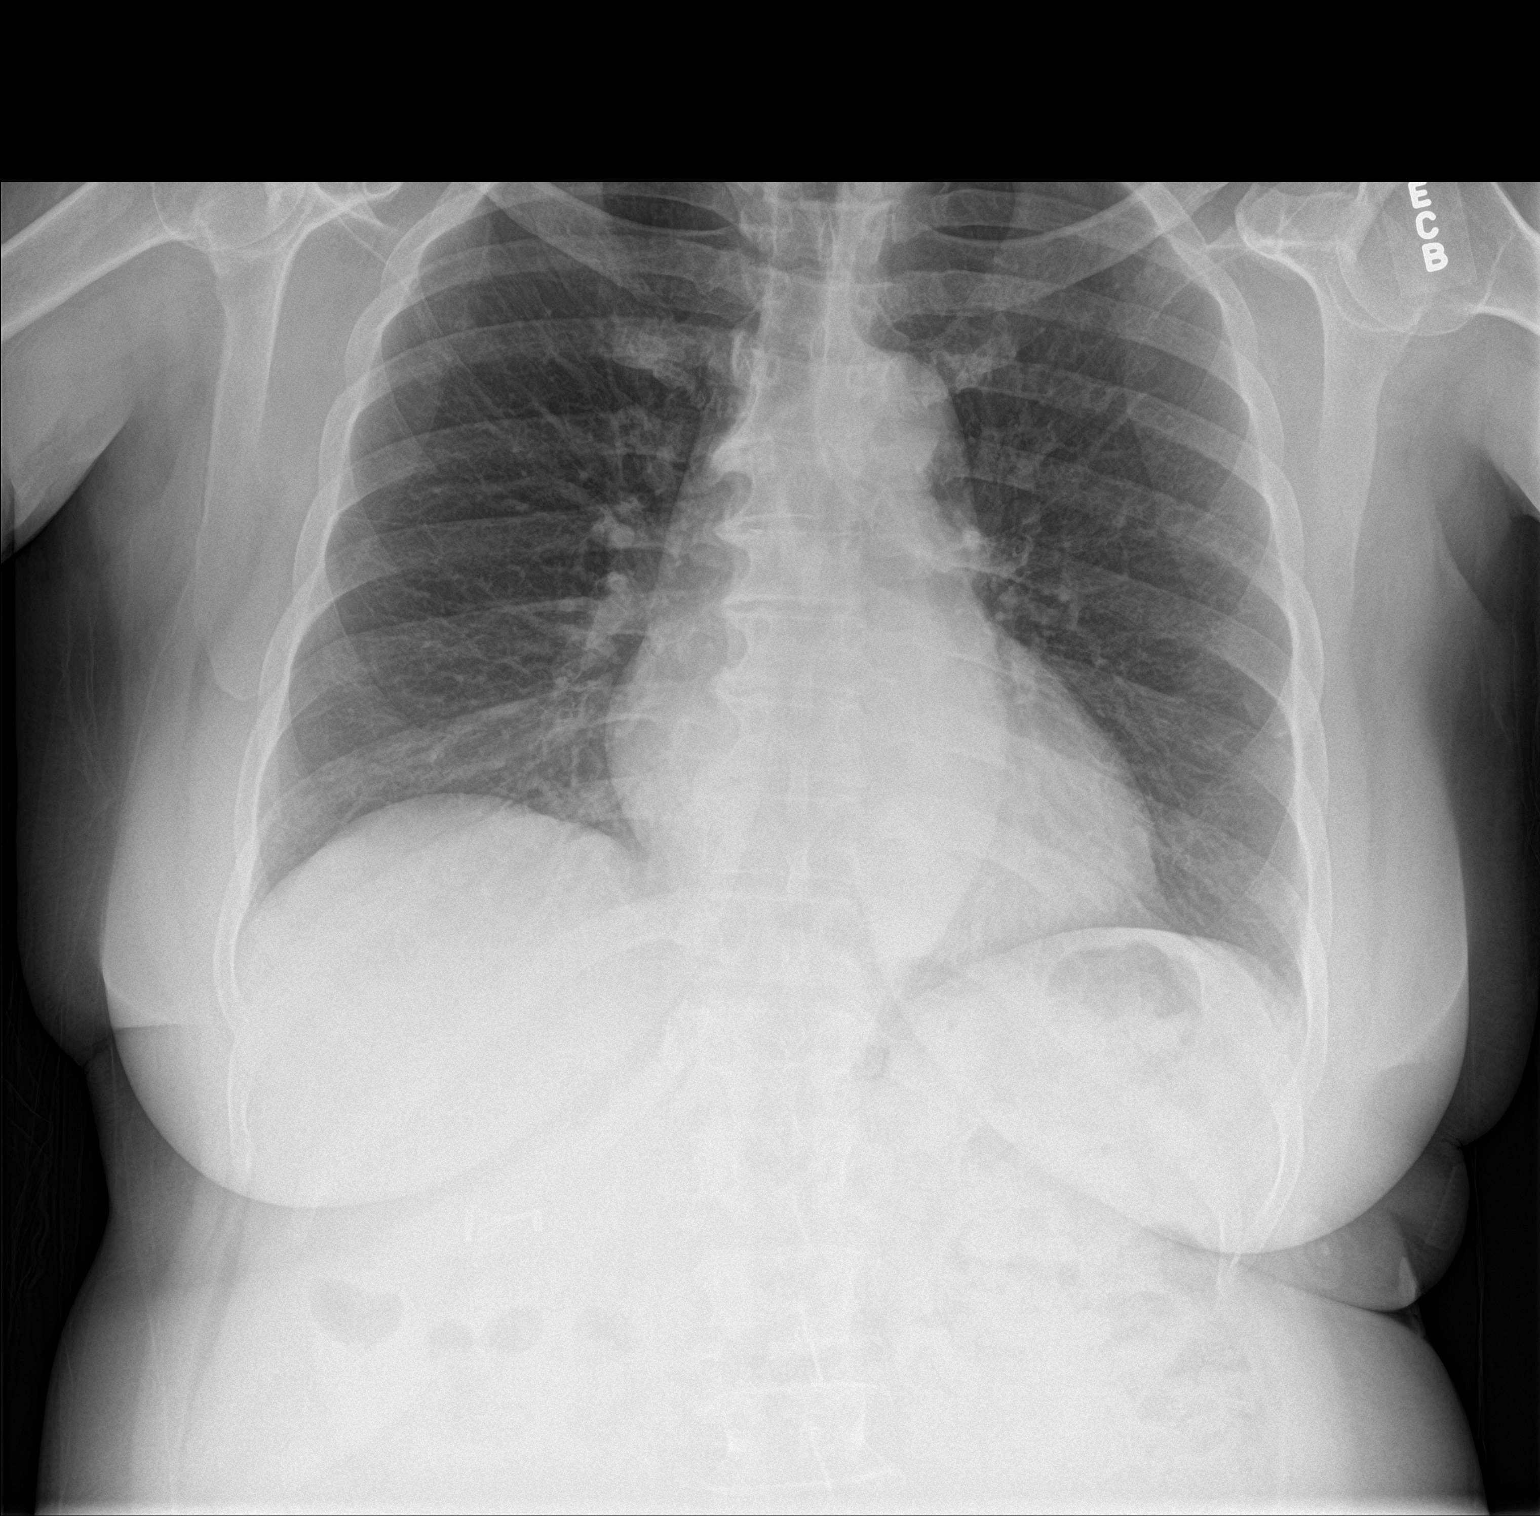

[chest lat]
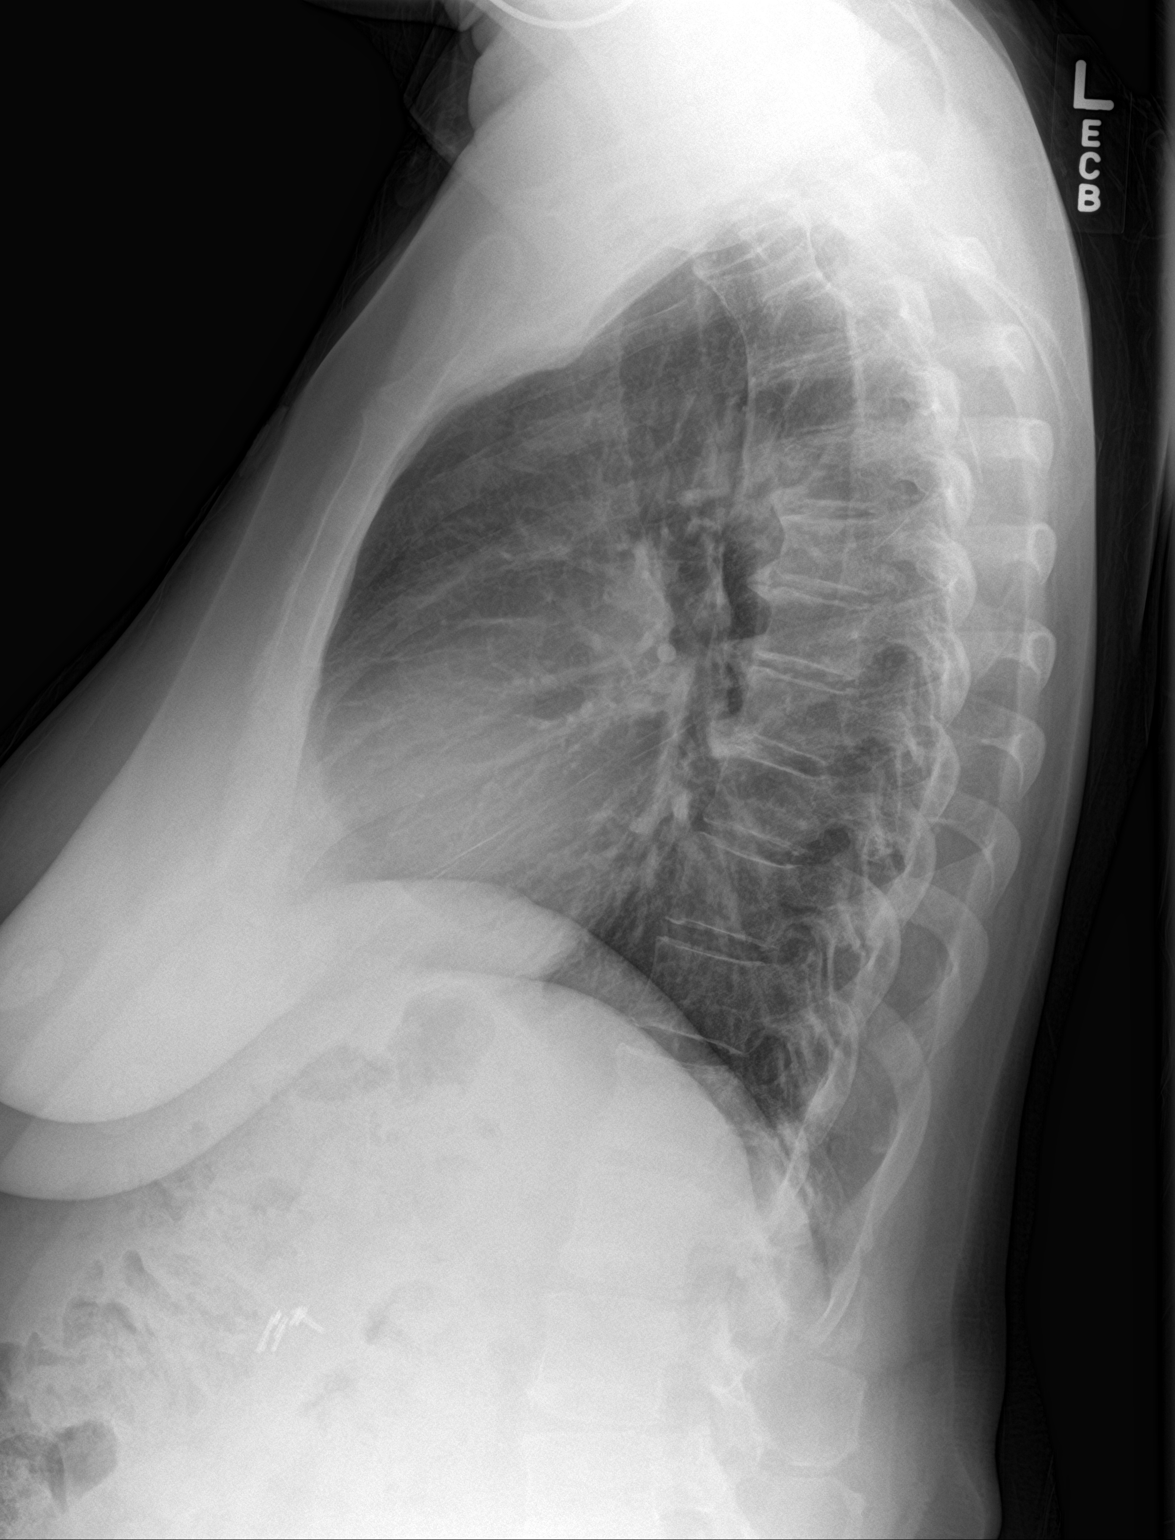

[2 of 2 positions shown; findings below may reference images not displayed]

FINDINGS: The heart size and mediastinal contours are within normal limits.
Both lungs are clear. Mild thoracic spine degenerative changes again
seen.
IMPRESSION: Stable exam.  No active cardiopulmonary disease.

## 2019-07-05 ENCOUNTER — Encounter: Payer: Self-pay | Admitting: Gastroenterology

## 2020-09-21 ENCOUNTER — Ambulatory Visit: Payer: Medicaid Other | Admitting: Cardiology

## 2020-09-21 ENCOUNTER — Encounter: Payer: Self-pay | Admitting: Cardiology

## 2020-09-21 ENCOUNTER — Other Ambulatory Visit: Payer: Self-pay

## 2020-09-21 VITALS — BP 152/109 | HR 87 | Resp 16 | Ht 66.0 in | Wt 147.0 lb

## 2020-09-21 DIAGNOSIS — R0600 Dyspnea, unspecified: Secondary | ICD-10-CM

## 2020-09-21 DIAGNOSIS — R0609 Other forms of dyspnea: Secondary | ICD-10-CM

## 2020-09-21 DIAGNOSIS — Z13220 Encounter for screening for lipoid disorders: Secondary | ICD-10-CM

## 2020-09-21 DIAGNOSIS — I10 Essential (primary) hypertension: Secondary | ICD-10-CM

## 2020-09-21 MED ORDER — HYDROCHLOROTHIAZIDE 25 MG PO TABS: 25.0000 mg | ORAL_TABLET | Freq: Every day | ORAL | 3 refills | Status: DC

## 2020-09-21 NOTE — Progress Notes (Signed)
Patient referred by Daisy Pascal, PA-C for dyspnea on exertion  Subjective:   Daisy [name], female    DOB: 1956-09-24, 64 y.o.   MRN: [id number]   Chief Complaint  Patient presents with   Hypertension   DOE   New Patient (Initial Visit)    HPI  64 y.o. African American female with hypertension, dyspnea on exertion   Patient has had uncontrolled hypertension for a while. She reports dyspnea, both with exertion and rest. She denies chest pain. She currently on amlodipine 10 mg. She does not smoke tobacco, smokes marijuana sometimes.   Past Medical History:  Diagnosis Date   DOE (dyspnea on exertion)    Hypertension      Past Surgical History:  Procedure Laterality Date   CARPAL TUNNEL RELEASE       Social History   Tobacco Use  Smoking Status Never Smoker  Smokeless Tobacco Never Used    Social History   Substance and Sexual Activity  Alcohol Use Yes   Comment: occ     No family history on file.   Current Outpatient Medications on File Prior to Visit  Medication Sig Dispense Refill   amLODipine (NORVASC) 10 MG tablet Take 10 mg by mouth daily.     aspirin EC 81 MG tablet Take 81 mg by mouth daily. Swallow whole.     cetirizine (ZYRTEC) 10 MG tablet Take 10 mg by mouth daily.     HYDROcodone-acetaminophen (NORCO/VICODIN) 5-325 MG tablet Take 1 tablet by mouth every 6 (six) hours as needed.     omeprazole (PRILOSEC) 40 MG capsule omeprazole 40 mg capsule,delayed release  TK 1 C PO QD     potassium citrate (UROCIT-K) 10 MEQ (1080 MG) SR tablet Take 1 tablet by mouth daily.      PROAIR HFA 108 (90 Base) MCG/ACT inhaler Inhale 2 puffs into the lungs every 4 (four) hours as needed.     pyridOXINE (VITAMIN B-6) 100 MG tablet Take 100 mg by mouth daily.     simethicone (MYLICON) 40 MG/0.6ML drops Take 0.6 mLs (40 mg total) by mouth 4 (four) times daily as needed for flatulence. 30 mL 0   valACYclovir (VALTREX) 1000 MG tablet SMARTSIG:2  Tablet(s) By Mouth Every 12 Hours     Vitamin D, Ergocalciferol, (DRISDOL) 1.25 MG (50000 UNIT) CAPS capsule Take 50,000 Units by mouth once a week.     No current facility-administered medications on file prior to visit.    Cardiovascular and other pertinent studies:  EKG 09/21/2020: Sinus rhythm 80 bpm RBBB Biatrial enlargement    Recent labs: Not available    Review of Systems  Cardiovascular: Positive for dyspnea on exertion. Negative for chest pain, leg swelling, palpitations and syncope.         Vitals:   09/21/20 0929  BP: (!) 157/108  Pulse: 84  Resp: 16  SpO2: 96%     Body mass index is 23.73 kg/m. Filed Weights   09/21/20 0929  Weight: 147 lb (66.7 kg)     Objective:   Physical Exam Vitals and nursing note reviewed.  Constitutional:      General: She is not in acute distress. Neck:     Vascular: No JVD.  Cardiovascular:     Rate and Rhythm: Normal rate and regular rhythm.     Heart sounds: Normal heart sounds. No murmur heard.   Pulmonary:     Effort: Pulmonary effort is normal.     Breath sounds: Normal  breath sounds. No wheezing or rales.            Assessment & Recommendations:   64 y.o. African American female with hypertension, dyspnea on exertion  Dyspnea on exertion: Most likely due to uncontrolled hypertension. Will check echocardiogram and exercise treadmill stress test.  Started HCTZ 25 mg daily. Will check CBC, BMP, BNP, lipid panel  Further recommendations after above testing.   Thank you for referring the patient to [place]. Please feel free to contact with any questions.   Elder Negus, MD Pager: [phone] Office: [phone]

## 2020-10-16 ENCOUNTER — Other Ambulatory Visit: Payer: Medicaid Other

## 2020-10-23 ENCOUNTER — Other Ambulatory Visit: Payer: Medicaid Other

## 2020-11-01 ENCOUNTER — Other Ambulatory Visit: Payer: Medicaid Other

## 2020-11-03 ENCOUNTER — Institutional Professional Consult (permissible substitution): Payer: Medicaid Other | Admitting: Pulmonary Disease

## 2020-11-30 ENCOUNTER — Institutional Professional Consult (permissible substitution): Payer: Medicaid Other | Admitting: Pulmonary Disease

## 2021-01-05 ENCOUNTER — Other Ambulatory Visit: Payer: Self-pay | Admitting: Physician Assistant

## 2021-01-05 DIAGNOSIS — Z1231 Encounter for screening mammogram for malignant neoplasm of breast: Secondary | ICD-10-CM

## 2021-02-26 ENCOUNTER — Ambulatory Visit: Payer: Medicaid Other

## 2021-05-22 ENCOUNTER — Other Ambulatory Visit: Payer: Self-pay | Admitting: Cardiology

## 2021-05-22 DIAGNOSIS — I10 Essential (primary) hypertension: Secondary | ICD-10-CM

## 2021-12-05 ENCOUNTER — Encounter: Payer: Self-pay | Admitting: Cardiology

## 2021-12-05 ENCOUNTER — Ambulatory Visit: Payer: Medicaid Other | Admitting: Cardiology

## 2021-12-05 ENCOUNTER — Other Ambulatory Visit: Payer: Self-pay

## 2021-12-05 VITALS — BP 173/101 | HR 80 | Temp 98.0°F | Resp 16 | Ht 66.0 in | Wt 145.0 lb

## 2021-12-05 DIAGNOSIS — R0609 Other forms of dyspnea: Secondary | ICD-10-CM

## 2021-12-05 DIAGNOSIS — I10 Essential (primary) hypertension: Secondary | ICD-10-CM

## 2021-12-05 NOTE — Progress Notes (Signed)
Patient referred by [name], PA-C for dyspnea on exertion  Subjective:   Daisy [name], female    DOB: 12-Nov-1956, 66 y.o.   MRN: [id number]   Chief Complaint  Patient presents with   DOE   Follow-up    HPI  66 y.o. African American female with hypertension, dyspnea on exertion  I last saw the patient in 08/2020 for similar complaints. I had then recommended chocardiogram and exercise treadmill stress test, along with checking labs CBC, BMP, BNP, lipid panel. It does not appear that patient underwent this testing back then.   She reports dyspnea but no worse than usual. Blood pressure is uncontrolled. Compliance is questionable. She denies any chest pain, headache, blurry vision.   Current Outpatient Medications on File Prior to Visit  Medication Sig Dispense Refill   amLODipine (NORVASC) 10 MG tablet Take 10 mg by mouth daily.     aspirin EC 81 MG tablet Take 81 mg by mouth daily. Swallow whole.     cetirizine (ZYRTEC) 10 MG tablet Take 10 mg by mouth daily.     hydrochlorothiazide (HYDRODIURIL) 25 MG tablet TAKE 1 TABLET(25 MG) BY MOUTH DAILY 30 tablet 3   HYDROcodone-acetaminophen (NORCO/VICODIN) 5-325 MG tablet Take 1 tablet by mouth every 6 (six) hours as needed.     omeprazole (PRILOSEC) 40 MG capsule omeprazole 40 mg capsule,delayed release  TK 1 C PO QD     potassium citrate (UROCIT-K) 10 MEQ (1080 MG) SR tablet Take 1 tablet by mouth daily.      PROAIR HFA 108 (90 Base) MCG/ACT inhaler Inhale 2 puffs into the lungs every 4 (four) hours as needed.     pyridOXINE (VITAMIN B-6) 100 MG tablet Take 100 mg by mouth daily.     simethicone (MYLICON) 40 MG/0.6ML drops Take 0.6 mLs (40 mg total) by mouth 4 (four) times daily as needed for flatulence. 30 mL 0   valACYclovir (VALTREX) 1000 MG tablet SMARTSIG:2 Tablet(s) By Mouth Every 12 Hours     Vitamin D, Ergocalciferol, (DRISDOL) 1.25 MG (50000 UNIT) CAPS capsule Take 50,000 Units by mouth once a week.     No current  facility-administered medications on file prior to visit.    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  TSH 0.8 normal     Review of Systems  Cardiovascular:  Positive for dyspnea on exertion. Negative for chest pain, leg swelling, palpitations and syncope.  Neurological:  Positive for paresthesias (Both hands, carpal tunnel).        Vitals:   12/05/21 0911 12/05/21 0912  BP: (!) 200/130 (!) 173/101  Pulse: 88 80  Resp: 16   Temp: 98 F (36.7 C)   SpO2: 97%     Body mass index is 23.4 kg/m. Filed Weights   12/05/21 0911  Weight: 145 lb (65.8 kg)     Objective:   Physical Exam Vitals and nursing note reviewed.  Constitutional:      General: She is not in acute distress. Neck:     Vascular: No JVD.  Cardiovascular:     Rate and Rhythm: Normal rate and regular rhythm.     Heart sounds: Normal heart sounds. No murmur heard. Pulmonary:     Effort: Pulmonary effort is normal.     Breath sounds: Normal breath sounds. No wheezing or rales.  Musculoskeletal:  Right lower leg: No edema.     Left lower leg: No edema.           Assessment & Recommendations:   66 y.o. African American female with hypertension, dyspnea on exertion  Dyspnea on exertion: Most likely due to uncontrolled hypertension.  Recommend echocardiogram to start with.  Hypertension: Compliance remains questionable.  Continue HCTZ 25 mg , amlodipine 10 mg. Recommend regular home monitoring. Bring BP monitor to next visit.  Will obtain labs from PCP  F/u in 3-4 weeks    Elder Negus, MD Pager: [phone] Office: [phone]

## 2021-12-18 ENCOUNTER — Other Ambulatory Visit: Payer: Self-pay | Admitting: Cardiology

## 2021-12-18 DIAGNOSIS — I10 Essential (primary) hypertension: Secondary | ICD-10-CM

## 2022-07-16 ENCOUNTER — Other Ambulatory Visit: Payer: Self-pay | Admitting: Cardiology

## 2022-07-16 DIAGNOSIS — I10 Essential (primary) hypertension: Secondary | ICD-10-CM

## 2022-07-19 ENCOUNTER — Other Ambulatory Visit: Payer: Self-pay | Admitting: Cardiology

## 2022-07-19 DIAGNOSIS — I10 Essential (primary) hypertension: Secondary | ICD-10-CM

## 2022-09-23 ENCOUNTER — Other Ambulatory Visit: Payer: Medicaid Other

## 2022-09-25 ENCOUNTER — Telehealth: Payer: Self-pay | Admitting: Cardiology

## 2022-09-25 NOTE — Telephone Encounter (Signed)
Ok

## 2022-09-25 NOTE — Telephone Encounter (Signed)
PT has cancelled or n/s testing appts multiple times. I have reached out to her multiple times in the past 10 months to get her scheduled and explained the importance of keeping appointments and following provider care plans. No testing has been completed at this time. I am going to remove testing orders.

## 2023-01-08 ENCOUNTER — Ambulatory Visit: Payer: Medicaid Other | Admitting: Cardiology

## 2023-01-09 ENCOUNTER — Ambulatory Visit: Payer: Medicaid Other | Admitting: Cardiology

## 2023-01-16 ENCOUNTER — Ambulatory Visit: Payer: Medicaid Other | Admitting: Cardiology

## 2023-01-16 ENCOUNTER — Encounter: Payer: Self-pay | Admitting: Cardiology

## 2023-01-16 VITALS — BP 146/95 | HR 97 | Resp 16 | Ht 66.0 in | Wt 147.0 lb

## 2023-01-16 DIAGNOSIS — I10 Essential (primary) hypertension: Secondary | ICD-10-CM

## 2023-01-16 DIAGNOSIS — R0609 Other forms of dyspnea: Secondary | ICD-10-CM

## 2023-01-16 DIAGNOSIS — I499 Cardiac arrhythmia, unspecified: Secondary | ICD-10-CM

## 2023-01-16 NOTE — Progress Notes (Signed)
Patient referred by Shanon Rosser, PA-C for dyspnea on exertion  Subjective:   Daisy [name], female    DOB: 02-19-1956, 67 y.o.   MRN: [id number]   Chief Complaint  Patient presents with   Follow-up   Shortness of Breath    HPI  67 y.o. African American female with hypertension, dyspnea on exertion  And also the patient about a year ago.  I recommend echocardiogram, it does not appear that she underwent the same.  Blood pressure remains elevated.  She does not check regularly at home.    Of note, she does have bilateral carpal tunnel syndrome, and is hoping to undergo surgery on her left hand soon.    Current Outpatient Medications:    amLODipine (NORVASC) 10 MG tablet, Take 10 mg by mouth daily., Disp: , Rfl:    aspirin EC 81 MG tablet, Take 81 mg by mouth daily. Swallow whole., Disp: , Rfl:    cetirizine (ZYRTEC) 10 MG tablet, Take 10 mg by mouth daily., Disp: , Rfl:    cyclobenzaprine (FLEXERIL) 5 MG tablet, Take 5 mg by mouth 3 (three) times daily as needed., Disp: , Rfl:    hydrochlorothiazide (HYDRODIURIL) 25 MG tablet, TAKE 1 TABLET(25 MG) BY MOUTH DAILY, Disp: 30 tablet, Rfl: 3   Potassium Chloride ER 20 MEQ TBCR, Take 1 tablet by mouth daily., Disp: , Rfl:    PROAIR HFA 108 (90 Base) MCG/ACT inhaler, Inhale 2 puffs into the lungs every 4 (four) hours as needed., Disp: , Rfl:    pyridOXINE (VITAMIN B-6) 100 MG tablet, Take 100 mg by mouth daily., Disp: , Rfl:    valACYclovir (VALTREX) 1000 MG tablet, SMARTSIG:2 Tablet(s) By Mouth Every 12 Hours, Disp: , Rfl:    Vitamin D, Ergocalciferol, (DRISDOL) 1.25 MG (50000 UNIT) CAPS capsule, Take 50,000 Units by mouth once a week., Disp: , Rfl:   Cardiovascular and other pertinent studies:  EKG 01/16/2023: Sinus rhythm 89 bpm  Right bundle branch block Biatrial enlargement  Recent labs: 11/10/2021: H/H 12/38.  TSH 0.8 normal     Review of Systems  Cardiovascular:  Positive for dyspnea on exertion. Negative for  chest pain, leg swelling, palpitations and syncope.  Neurological:  Positive for paresthesias (Both hands, carpal tunnel).         Vitals:   01/16/23 1111 01/16/23 1116  BP: (!) 155/108 (!) 146/95  Pulse: 93 (!) 39  Resp: 16   SpO2:  (!) 87%    Body mass index is 23.73 kg/m. Filed Weights   01/16/23 1111  Weight: 147 lb (66.7 kg)     Objective:   Physical Exam Vitals and nursing note reviewed.  Constitutional:      General: She is not in acute distress. Neck:     Vascular: No JVD.  Cardiovascular:     Rate and Rhythm: Normal rate and regular rhythm.     Heart sounds: Normal heart sounds. No murmur heard. Pulmonary:     Effort: Pulmonary effort is normal.     Breath sounds: Normal breath sounds. No wheezing or rales.  Musculoskeletal:     Right lower leg: No edema.     Left lower leg: No edema.            Assessment & Recommendations:   67 y.o. African American female with hypertension, dyspnea on exertion  Dyspnea on exertion: Most likely due to uncontrolled hypertension.  Echocardiogram pending.  Hypertension: Compliance remains questionable.  Continue HCTZ 25 mg ,  amlodipine 10 mg. Recommend regular home monitoring. Bring BP monitor to next visit.   F/u in 4 weeks    Nigel Mormon, MD Pager: [phone] Office: [phone]

## 2023-03-20 ENCOUNTER — Other Ambulatory Visit: Payer: Self-pay | Admitting: Cardiology

## 2023-03-20 DIAGNOSIS — I10 Essential (primary) hypertension: Secondary | ICD-10-CM

## 2023-04-09 ENCOUNTER — Other Ambulatory Visit: Payer: Medicaid Other

## 2023-07-24 ENCOUNTER — Other Ambulatory Visit: Payer: Self-pay | Admitting: Cardiology

## 2023-07-24 DIAGNOSIS — I10 Essential (primary) hypertension: Secondary | ICD-10-CM

## 2023-08-14 ENCOUNTER — Other Ambulatory Visit: Payer: Self-pay | Admitting: Cardiology

## 2023-08-14 DIAGNOSIS — I10 Essential (primary) hypertension: Secondary | ICD-10-CM

## 2023-12-02 ENCOUNTER — Ambulatory Visit: Payer: Medicaid Other | Attending: Family Medicine | Admitting: Physical Therapy

## 2023-12-02 DIAGNOSIS — R29898 Other symptoms and signs involving the musculoskeletal system: Secondary | ICD-10-CM | POA: Insufficient documentation

## 2023-12-02 DIAGNOSIS — M5415 Radiculopathy, thoracolumbar region: Secondary | ICD-10-CM | POA: Insufficient documentation

## 2023-12-02 DIAGNOSIS — M79651 Pain in right thigh: Secondary | ICD-10-CM | POA: Insufficient documentation

## 2023-12-02 DIAGNOSIS — M6281 Muscle weakness (generalized): Secondary | ICD-10-CM | POA: Insufficient documentation

## 2023-12-15 ENCOUNTER — Ambulatory Visit: Payer: Medicaid Other | Admitting: Physical Therapy

## 2023-12-15 ENCOUNTER — Encounter: Payer: Self-pay | Admitting: Physical Therapy

## 2023-12-15 ENCOUNTER — Other Ambulatory Visit: Payer: Self-pay

## 2023-12-15 VITALS — BP 144/83 | HR 87

## 2023-12-15 DIAGNOSIS — M6281 Muscle weakness (generalized): Secondary | ICD-10-CM | POA: Diagnosis present

## 2023-12-15 DIAGNOSIS — M79651 Pain in right thigh: Secondary | ICD-10-CM

## 2023-12-15 DIAGNOSIS — R29898 Other symptoms and signs involving the musculoskeletal system: Secondary | ICD-10-CM | POA: Diagnosis present

## 2023-12-15 DIAGNOSIS — M5415 Radiculopathy, thoracolumbar region: Secondary | ICD-10-CM

## 2023-12-15 NOTE — Therapy (Signed)
OUTPATIENT PHYSICAL THERAPY LOWER EXTREMITY EVALUATION   Patient Name: Daisy [name] MRN: [id number] DOB:[birth year], 68 y.o., female Today's Date: 12/15/2023  END OF SESSION:  PT End of Session - 12/15/23 1117     Visit Number 1    Number of Visits 7   6 + eval   Date for PT Re-Evaluation 02/06/24   pushed out due to scheduling delay   Authorization Type [employer] [place]    PT Start Time 1108   pt still checking in on receipt from lobby and requesting to use restroom prior to onset of eval.   PT Stop Time 1150    PT Time Calculation (min) 42 min    Equipment Utilized During Treatment Gait belt    Behavior During Therapy Anxious;Restless             Past Medical History:  Diagnosis Date   DOE (dyspnea on exertion)    Hypertension    Past Surgical History:  Procedure Laterality Date   CARPAL TUNNEL RELEASE     Patient Active Problem List   Diagnosis Date Noted   Dyspnea on exertion 09/21/2020   Screening for cholesterol level 09/21/2020   Corneal abrasion 07/23/2011   Elbow pain, left 06/30/2011   Sore throat 02/12/2011   HYPOKALEMIA, MILD 11/04/2008   CARPAL TUNNEL SYNDROME 04/30/2007   Primary hypertension 01/22/2007   RHINITIS, ALLERGIC 01/22/2007   MENOPAUSAL SYNDROME 01/22/2007    PCP: [name], PA-C  REFERRING PROVIDER: Cain Saupe, MD  REFERRING DIAG: M54.9 (ICD-10-CM) - Dorsalgia, unspecified  THERAPY DIAG:  Radiculopathy, thoracolumbar region  Pain in right thigh  Other symptoms and signs involving the musculoskeletal system  Muscle weakness (generalized)  Rationale for Evaluation and Treatment: Rehabilitation  ONSET DATE: 2-3 months ago  SUBJECTIVE:   SUBJECTIVE STATEMENT: Pt denies known mechanism of injury and denies history of falls.  She has constant right lateral thigh pain and is in pain at current.  This has been going on for the last few months.  She states her pain medicine does not help this type of  pain.  PERTINENT HISTORY: Carpal Tunnel Syndrome, HTN PAIN:  Are you having pain? Yes: NPRS scale: 10 Pain location: right thigh, low back, and right arm Pain description: "toothache" Aggravating factors: "I don't know" Relieving factors: Rubbing it  PRECAUTIONS: Fall  RED FLAGS: Bowel or bladder incontinence: Yes: urinary urge incontinence - MD aware, pt wears pullups    WEIGHT BEARING RESTRICTIONS: No  FALLS:  Has patient fallen in last 6 months? No  LIVING ENVIRONMENT: Lives with: lives with their family Lives in: House/apartment Stairs: Yes: Internal: 16 steps; on left going up Has following equipment at home: Single point cane, Walker - 2 wheeled, Wheelchair (manual), Shower bench, bed side commode, and Grab bars  OCCUPATION: Disabled  PLOF: Independent  PATIENT GOALS: No goal.  NEXT MD VISIT: none scheduled  OBJECTIVE:  Note: Objective measures were completed at Evaluation unless otherwise noted.  DIAGNOSTIC FINDINGS: no recent relevant imaging  PATIENT SURVEYS:  Modified Oswestry 11/50 = low perceived disability  LEFS To be assessed.  COGNITION: Overall cognitive status: Within functional limits for tasks assessed     SENSATION: Light touch: WFL  EDEMA:  None noted in BLE.  POSTURE: No Significant postural limitations  PALPATION: No overt tenderness to palpation.  LOWER EXTREMITY ROM:  Active ROM Right eval Left eval  Hip flexion [facility]  Hip extension    Hip abduction " "  Hip adduction " "  Hip internal rotation    Hip external rotation    Knee flexion " "  Knee extension " "  Ankle dorsiflexion " "  Ankle plantarflexion    Ankle inversion    Ankle eversion     (Blank rows = not tested)  LOWER EXTREMITY MMT:  MMT Right eval Left eval  Hip flexion 3+/5 3+/5  Hip extension    Hip abduction    Hip adduction    Hip internal rotation    Hip external rotation    Knee flexion    Knee extension 4/5 4/5  Ankle dorsiflexion 5/5  5/5  Ankle plantarflexion    Ankle inversion    Ankle eversion     (Blank rows = not tested)  LOWER EXTREMITY SPECIAL TESTS:  Hip special tests: Ober's test: negative  FUNCTIONAL TESTS:  5 times sit to stand: 14.13 sec w/o UE support 6 minute walk test: To be assessed.  GAIT: Distance walked: various clinic distances Assistive device utilized: None Level of assistance: Complete Independence Comments: Pt ambulates w/ mildly antalgic gait.                                                                                                                                TREATMENT DATE: 12/15/2023 - eval only d/t insurance type.   Pt is dyspneic with all activity and at rest during sitting this visit.  Upon discussion of this, pt reports she is always this way but is not on oxygen at baseline.  Unable to accurately assess SpO2 due to long nails and knee high boots.  Did encourage pt to speak to PCP about intermittent light-headedness and shortness of breath.  PATIENT EDUCATION:  Education details: PT POC, assessments used and to be used, and goals to be set. Person educated: Patient and Parent Education method: Explanation Education comprehension: verbalized understanding and needs further education  HOME EXERCISE PROGRAM: To be established.  ASSESSMENT:  CLINICAL IMPRESSION: Patient is a 68 y.o. female who was seen today for physical therapy evaluation and treatment for low back pain radiating into her right thigh.  Pt has a significant PMH of Carpal Tunnel Syndrome and HTN.  Identified impairments include pain, proximal LE weakness, and antalgic gait.  Evaluation via the following assessment tools: 5xSTS indicates mild fall risk.  She would benefit from skilled PT to address impairments as noted and progress towards long term goals.  OBJECTIVE IMPAIRMENTS: Abnormal gait, decreased activity tolerance, decreased knowledge of condition, decreased mobility, decreased strength, and pain.    ACTIVITY LIMITATIONS: standing, squatting, stairs, and locomotion level  PARTICIPATION LIMITATIONS: occupation  PERSONAL FACTORS: Fitness, Past/current experiences, Time since onset of injury/illness/exacerbation, and 1 comorbidity: HTN  are also affecting patient's functional outcome.   REHAB POTENTIAL: Good  CLINICAL DECISION MAKING: Stable/uncomplicated  EVALUATION COMPLEXITY: Low   GOALS: Goals reviewed with patient? Yes  SHORT TERM GOALS: Target date: 01/09/2024 Pt will be  independent and compliant with strength and stretching focused HEP in order to maintain functional progress and improve mobility. Baseline:  To be established. Goal status: INITIAL  2.  Pt will decrease 5xSTS to </=12 seconds in order to demonstrate decreased risk for falls and improved functional bilateral LE strength and power. Baseline: 14.13 sec w/o UE support Goal status: INITIAL  3.  to be assessed w/ LTG set as appropriate. Baseline: To be assessed. Goal status: INITIAL  LONG TERM GOALS: Target date: 01/30/2024  to be assessed w/ LTG set as appropriate. Baseline: To be assessed. Goal status: INITIAL  2.  Modified Oswestry to be improved to </=6/50 in order to demonstrate improved pain management and quality of life. Baseline: 11/50 Goal status: INITIAL  3.  LEFS to be assessed w/ LTG set as appropriate. Baseline: To be assessed. Goal status: INITIAL  PLAN:  PT FREQUENCY: 1x/week  PT DURATION: 6 weeks  PLANNED INTERVENTIONS: 97164- PT Re-evaluation, 97110-Therapeutic exercises, 97530- Therapeutic activity, [id number]- Neuromuscular re-education, 97535- Self Care, 45409- Manual therapy, [phone]- Gait training, [phone]- Electrical stimulation (manual), Patient/Family education, Balance training, Taping, Dry Needling, Joint mobilization, Spinal mobilization, DME instructions, Cryotherapy, and Moist heat  PLAN FOR NEXT SESSION: LEFS and - set goals.  Initiate strength/stretching HEP  for low back and LE.  Pain management techniques.  Check all possible CPT codes: See Planned Interventions List for Planned CPT Codes    Check all conditions that are expected to impact treatment: None of these apply   If treatment provided at initial evaluation, no treatment charged due to lack of authorization.    Sadie Haber, PT, DPT 12/15/2023, 12:43 PM

## 2023-12-24 ENCOUNTER — Ambulatory Visit: Payer: Medicaid Other | Admitting: Physical Therapy

## 2023-12-31 ENCOUNTER — Ambulatory Visit: Payer: Medicaid Other | Attending: Family Medicine | Admitting: Physical Therapy

## 2023-12-31 ENCOUNTER — Telehealth: Payer: Self-pay | Admitting: Physical Therapy

## 2023-12-31 NOTE — Telephone Encounter (Signed)
PT spoke to pt regarding 2 no shows and policy.  Reminded of pt appt on 2/12 and she states she will try to make it or call the clinic to cancel in advance.  She states she has had a cold.  Camille Bal, PT, DPT

## 2024-01-07 ENCOUNTER — Ambulatory Visit: Payer: Medicaid Other | Admitting: Physical Therapy

## 2024-01-14 ENCOUNTER — Ambulatory Visit: Payer: Medicaid Other | Admitting: Physical Therapy

## 2024-01-14 ENCOUNTER — Telehealth: Payer: Self-pay | Admitting: Physical Therapy

## 2024-01-14 NOTE — Telephone Encounter (Signed)
 Spoke to patient regarding 4th no show today and cancellation of remaining appts per company policy.  She will need a new referral to return.  Camille Bal, PT, DPT

## 2024-01-21 ENCOUNTER — Ambulatory Visit: Payer: Medicaid Other | Admitting: Physical Therapy

## 2024-01-28 ENCOUNTER — Other Ambulatory Visit: Payer: Self-pay | Admitting: Family Medicine

## 2024-01-28 ENCOUNTER — Ambulatory Visit: Payer: Medicaid Other | Admitting: Physical Therapy

## 2024-01-28 DIAGNOSIS — Z1231 Encounter for screening mammogram for malignant neoplasm of breast: Secondary | ICD-10-CM

## 2024-02-24 ENCOUNTER — Ambulatory Visit

## 2024-03-15 ENCOUNTER — Ambulatory Visit

## 2024-03-26 ENCOUNTER — Ambulatory Visit

## 2024-04-23 ENCOUNTER — Encounter: Payer: Self-pay | Admitting: Neurology

## 2024-07-15 ENCOUNTER — Ambulatory Visit: Admitting: Neurology

## 2024-07-25 ENCOUNTER — Observation Stay (HOSPITAL_COMMUNITY)

## 2024-07-25 ENCOUNTER — Emergency Department (HOSPITAL_COMMUNITY)

## 2024-07-25 ENCOUNTER — Encounter (HOSPITAL_COMMUNITY): Payer: Self-pay

## 2024-07-25 ENCOUNTER — Inpatient Hospital Stay (HOSPITAL_COMMUNITY)
Admission: EM | Admit: 2024-07-25 | Discharge: 2024-08-25 | DRG: 020 | Disposition: E | Attending: Internal Medicine | Admitting: Internal Medicine

## 2024-07-25 ENCOUNTER — Other Ambulatory Visit: Payer: Self-pay

## 2024-07-25 DIAGNOSIS — R000 Tachycardia, unspecified: Secondary | ICD-10-CM | POA: Diagnosis present

## 2024-07-25 DIAGNOSIS — E8721 Acute metabolic acidosis: Secondary | ICD-10-CM | POA: Diagnosis not present

## 2024-07-25 DIAGNOSIS — E440 Moderate protein-calorie malnutrition: Secondary | ICD-10-CM | POA: Diagnosis present

## 2024-07-25 DIAGNOSIS — Z515 Encounter for palliative care: Secondary | ICD-10-CM

## 2024-07-25 DIAGNOSIS — R7881 Bacteremia: Secondary | ICD-10-CM | POA: Diagnosis not present

## 2024-07-25 DIAGNOSIS — R569 Unspecified convulsions: Secondary | ICD-10-CM | POA: Diagnosis not present

## 2024-07-25 DIAGNOSIS — I63423 Cerebral infarction due to embolism of bilateral anterior cerebral arteries: Secondary | ICD-10-CM | POA: Diagnosis not present

## 2024-07-25 DIAGNOSIS — E119 Type 2 diabetes mellitus without complications: Secondary | ICD-10-CM | POA: Diagnosis present

## 2024-07-25 DIAGNOSIS — E870 Hyperosmolality and hypernatremia: Secondary | ICD-10-CM | POA: Diagnosis not present

## 2024-07-25 DIAGNOSIS — I720 Aneurysm of carotid artery: Secondary | ICD-10-CM | POA: Diagnosis not present

## 2024-07-25 DIAGNOSIS — I607 Nontraumatic subarachnoid hemorrhage from unspecified intracranial artery: Secondary | ICD-10-CM | POA: Diagnosis not present

## 2024-07-25 DIAGNOSIS — Z7189 Other specified counseling: Secondary | ICD-10-CM | POA: Diagnosis not present

## 2024-07-25 DIAGNOSIS — Z7982 Long term (current) use of aspirin: Secondary | ICD-10-CM | POA: Diagnosis not present

## 2024-07-25 DIAGNOSIS — I63439 Cerebral infarction due to embolism of unspecified posterior cerebral artery: Secondary | ICD-10-CM | POA: Diagnosis not present

## 2024-07-25 DIAGNOSIS — R5381 Other malaise: Secondary | ICD-10-CM | POA: Diagnosis present

## 2024-07-25 DIAGNOSIS — F1410 Cocaine abuse, uncomplicated: Secondary | ICD-10-CM | POA: Diagnosis present

## 2024-07-25 DIAGNOSIS — J449 Chronic obstructive pulmonary disease, unspecified: Secondary | ICD-10-CM | POA: Diagnosis present

## 2024-07-25 DIAGNOSIS — A419 Sepsis, unspecified organism: Secondary | ICD-10-CM | POA: Diagnosis not present

## 2024-07-25 DIAGNOSIS — I615 Nontraumatic intracerebral hemorrhage, intraventricular: Secondary | ICD-10-CM | POA: Diagnosis present

## 2024-07-25 DIAGNOSIS — F1010 Alcohol abuse, uncomplicated: Secondary | ICD-10-CM | POA: Diagnosis present

## 2024-07-25 DIAGNOSIS — G911 Obstructive hydrocephalus: Secondary | ICD-10-CM | POA: Diagnosis present

## 2024-07-25 DIAGNOSIS — I609 Nontraumatic subarachnoid hemorrhage, unspecified: Secondary | ICD-10-CM | POA: Diagnosis present

## 2024-07-25 DIAGNOSIS — D72829 Elevated white blood cell count, unspecified: Secondary | ICD-10-CM | POA: Diagnosis present

## 2024-07-25 DIAGNOSIS — I468 Cardiac arrest due to other underlying condition: Secondary | ICD-10-CM | POA: Diagnosis not present

## 2024-07-25 DIAGNOSIS — Y92009 Unspecified place in unspecified non-institutional (private) residence as the place of occurrence of the external cause: Secondary | ICD-10-CM

## 2024-07-25 DIAGNOSIS — D751 Secondary polycythemia: Secondary | ICD-10-CM | POA: Diagnosis not present

## 2024-07-25 DIAGNOSIS — F1910 Other psychoactive substance abuse, uncomplicated: Secondary | ICD-10-CM | POA: Diagnosis not present

## 2024-07-25 DIAGNOSIS — I10 Essential (primary) hypertension: Secondary | ICD-10-CM | POA: Diagnosis present

## 2024-07-25 DIAGNOSIS — I63532 Cerebral infarction due to unspecified occlusion or stenosis of left posterior cerebral artery: Secondary | ICD-10-CM | POA: Diagnosis not present

## 2024-07-25 DIAGNOSIS — G8929 Other chronic pain: Secondary | ICD-10-CM | POA: Diagnosis not present

## 2024-07-25 DIAGNOSIS — Z8659 Personal history of other mental and behavioral disorders: Secondary | ICD-10-CM | POA: Diagnosis not present

## 2024-07-25 DIAGNOSIS — E876 Hypokalemia: Secondary | ICD-10-CM | POA: Diagnosis present

## 2024-07-25 DIAGNOSIS — Z841 Family history of disorders of kidney and ureter: Secondary | ICD-10-CM

## 2024-07-25 DIAGNOSIS — R0682 Tachypnea, not elsewhere classified: Secondary | ICD-10-CM | POA: Diagnosis not present

## 2024-07-25 DIAGNOSIS — I739 Peripheral vascular disease, unspecified: Secondary | ICD-10-CM | POA: Diagnosis present

## 2024-07-25 DIAGNOSIS — I469 Cardiac arrest, cause unspecified: Secondary | ICD-10-CM

## 2024-07-25 DIAGNOSIS — G9349 Other encephalopathy: Secondary | ICD-10-CM | POA: Diagnosis present

## 2024-07-25 DIAGNOSIS — Z66 Do not resuscitate: Secondary | ICD-10-CM | POA: Diagnosis not present

## 2024-07-25 DIAGNOSIS — I602 Nontraumatic subarachnoid hemorrhage from anterior communicating artery: Secondary | ICD-10-CM | POA: Diagnosis present

## 2024-07-25 DIAGNOSIS — I63322 Cerebral infarction due to thrombosis of left anterior cerebral artery: Secondary | ICD-10-CM | POA: Diagnosis not present

## 2024-07-25 DIAGNOSIS — F419 Anxiety disorder, unspecified: Secondary | ICD-10-CM | POA: Diagnosis present

## 2024-07-25 DIAGNOSIS — R931 Abnormal findings on diagnostic imaging of heart and coronary circulation: Secondary | ICD-10-CM | POA: Diagnosis not present

## 2024-07-25 DIAGNOSIS — R001 Bradycardia, unspecified: Secondary | ICD-10-CM | POA: Diagnosis not present

## 2024-07-25 DIAGNOSIS — R6510 Systemic inflammatory response syndrome (SIRS) of non-infectious origin without acute organ dysfunction: Secondary | ICD-10-CM | POA: Diagnosis present

## 2024-07-25 DIAGNOSIS — E8729 Other acidosis: Secondary | ICD-10-CM | POA: Diagnosis not present

## 2024-07-25 DIAGNOSIS — E559 Vitamin D deficiency, unspecified: Secondary | ICD-10-CM | POA: Diagnosis present

## 2024-07-25 DIAGNOSIS — I959 Hypotension, unspecified: Secondary | ICD-10-CM | POA: Diagnosis not present

## 2024-07-25 DIAGNOSIS — E872 Acidosis, unspecified: Secondary | ICD-10-CM | POA: Diagnosis present

## 2024-07-25 DIAGNOSIS — G9340 Encephalopathy, unspecified: Secondary | ICD-10-CM | POA: Diagnosis not present

## 2024-07-25 DIAGNOSIS — K219 Gastro-esophageal reflux disease without esophagitis: Secondary | ICD-10-CM | POA: Diagnosis present

## 2024-07-25 DIAGNOSIS — E878 Other disorders of electrolyte and fluid balance, not elsewhere classified: Secondary | ICD-10-CM | POA: Diagnosis present

## 2024-07-25 DIAGNOSIS — Z79899 Other long term (current) drug therapy: Secondary | ICD-10-CM

## 2024-07-25 DIAGNOSIS — R509 Fever, unspecified: Secondary | ICD-10-CM | POA: Diagnosis not present

## 2024-07-25 DIAGNOSIS — M7989 Other specified soft tissue disorders: Secondary | ICD-10-CM | POA: Diagnosis not present

## 2024-07-25 LAB — URINALYSIS, W/ REFLEX TO CULTURE (INFECTION SUSPECTED)
Bilirubin Urine: NEGATIVE
Glucose, UA: NEGATIVE mg/dL
Ketones, ur: NEGATIVE mg/dL
Leukocytes,Ua: NEGATIVE
Nitrite: NEGATIVE
Protein, ur: 30 mg/dL — AB
Specific Gravity, Urine: 1.006 (ref 1.005–1.030)
pH: 7 (ref 5.0–8.0)

## 2024-07-25 LAB — COMPREHENSIVE METABOLIC PANEL WITH GFR
ALT: 18 U/L (ref 0–44)
AST: 36 U/L (ref 15–41)
Albumin: 4.3 g/dL (ref 3.5–5.0)
Alkaline Phosphatase: 121 U/L (ref 38–126)
Anion gap: 17 — ABNORMAL HIGH (ref 5–15)
BUN: 12 mg/dL (ref 8–23)
CO2: 25 mmol/L (ref 22–32)
Calcium: 9.9 mg/dL (ref 8.9–10.3)
Chloride: 98 mmol/L (ref 98–111)
Creatinine, Ser: 0.77 mg/dL (ref 0.44–1.00)
GFR, Estimated: >60 mL/min (ref >60)
Glucose, Bld: 139 mg/dL — ABNORMAL HIGH (ref 70–99)
Potassium: 2.6 mmol/L — CL (ref 3.5–5.1)
Sodium: 140 mmol/L (ref 135–145)
Total Bilirubin: 0.9 mg/dL (ref 0.0–1.2)
Total Protein: 8.2 g/dL — ABNORMAL HIGH (ref 6.5–8.1)

## 2024-07-25 LAB — BLOOD GAS, VENOUS
Acid-Base Excess: 3.5 mmol/L — ABNORMAL HIGH (ref 0.0–2.0)
Bicarbonate: 26.7 mmol/L (ref 20.0–28.0)
O2 Saturation: 86.6 %
Patient temperature: 37
pCO2, Ven: 35 mmHg — ABNORMAL LOW (ref 44–60)
pH, Ven: 7.49 — ABNORMAL HIGH (ref 7.25–7.43)
pO2, Ven: 54 mmHg — ABNORMAL HIGH (ref 32–45)

## 2024-07-25 LAB — I-STAT CG4 LACTIC ACID, ED
Lactic Acid, Venous: 3 mmol/L (ref 0.5–1.9)
Lactic Acid, Venous: 4.2 mmol/L (ref 0.5–1.9)

## 2024-07-25 LAB — CBC WITH DIFFERENTIAL/PLATELET
Abs Immature Granulocytes: 0.07 K/uL (ref 0.00–0.07)
Basophils Absolute: 0.1 K/uL (ref 0.0–0.1)
Basophils Relative: 0 %
Eosinophils Absolute: 0 K/uL (ref 0.0–0.5)
Eosinophils Relative: 0 %
HCT: 44.9 % (ref 36.0–46.0)
Hemoglobin: 15.4 g/dL — ABNORMAL HIGH (ref 12.0–15.0)
Immature Granulocytes: 0 %
Lymphocytes Relative: 11 %
Lymphs Abs: 2 K/uL (ref 0.7–4.0)
MCH: 29.4 pg (ref 26.0–34.0)
MCHC: 34.3 g/dL (ref 30.0–36.0)
MCV: 85.7 fL (ref 80.0–100.0)
Monocytes Absolute: 1.2 K/uL — ABNORMAL HIGH (ref 0.1–1.0)
Monocytes Relative: 6 %
Neutro Abs: 14.5 K/uL — ABNORMAL HIGH (ref 1.7–7.7)
Neutrophils Relative %: 83 %
Platelets: 292 K/uL (ref 150–400)
RBC: 5.24 MIL/uL — ABNORMAL HIGH (ref 3.87–5.11)
RDW: 13.7 % (ref 11.5–15.5)
WBC: 17.8 K/uL — ABNORMAL HIGH (ref 4.0–10.5)
nRBC: 0 % (ref 0.0–0.2)

## 2024-07-25 LAB — PROTIME-INR
INR: 1.2 (ref 0.8–1.2)
Prothrombin Time: 15.7 s — ABNORMAL HIGH (ref 11.4–15.2)

## 2024-07-25 LAB — RESP PANEL BY RT-PCR (RSV, FLU A&B, COVID)  RVPGX2
Influenza A by PCR: NEGATIVE
Influenza B by PCR: NEGATIVE
Resp Syncytial Virus by PCR: NEGATIVE
SARS Coronavirus 2 by RT PCR: NEGATIVE

## 2024-07-25 LAB — LIPASE, BLOOD: Lipase: 25 U/L (ref 11–51)

## 2024-07-25 LAB — CK: Total CK: 284 U/L — ABNORMAL HIGH (ref 38–234)

## 2024-07-25 LAB — AMMONIA: Ammonia: 31 umol/L (ref 9–35)

## 2024-07-25 LAB — LACTIC ACID, PLASMA
Lactic Acid, Venous: 2.7 mmol/L (ref 0.5–1.9)
Lactic Acid, Venous: 5.9 mmol/L (ref 0.5–1.9)

## 2024-07-25 LAB — TROPONIN T, HIGH SENSITIVITY
Troponin T High Sensitivity: 23 ng/L — ABNORMAL HIGH (ref 0–19)
Troponin T High Sensitivity: <15 ng/L (ref 0–19)

## 2024-07-25 MED ORDER — IOHEXOL 350 MG/ML SOLN
100.0000 mL | Freq: Once | INTRAVENOUS | Status: AC | PRN
  Administered 2024-07-25: 100 mL via INTRAVENOUS

## 2024-07-25 MED ORDER — METRONIDAZOLE 500 MG/100ML IV SOLN
500.0000 mg | Freq: Two times a day (BID) | INTRAVENOUS | Status: DC
  Filled 2024-07-25: qty 100

## 2024-07-25 MED ORDER — SODIUM CHLORIDE 0.9 % IV SOLN: 2.0000 g | Freq: Three times a day (TID) | INTRAVENOUS | Status: DC

## 2024-07-25 MED ORDER — POTASSIUM CHLORIDE 10 MEQ/100ML IV SOLN
10.0000 meq | Freq: Once | INTRAVENOUS | Status: AC
  Administered 2024-07-25: 10 meq via INTRAVENOUS
  Filled 2024-07-25: qty 100

## 2024-07-25 MED ORDER — CHLORHEXIDINE GLUCONATE CLOTH 2 % EX PADS
6.0000 | MEDICATED_PAD | Freq: Every day | CUTANEOUS | Status: DC
  Administered 2024-07-25 – 2024-08-12 (×21): 6 via TOPICAL

## 2024-07-25 MED ORDER — DEXTROSE 5 % IV SOLN
650.0000 mg | Freq: Three times a day (TID) | INTRAVENOUS | Status: DC
  Filled 2024-07-25 (×2): qty 13

## 2024-07-25 MED ORDER — VANCOMYCIN HCL 750 MG/150ML IV SOLN
750.0000 mg | Freq: Two times a day (BID) | INTRAVENOUS | Status: DC
  Administered 2024-07-26 – 2024-07-27 (×3): 750 mg via INTRAVENOUS
  Filled 2024-07-25 (×3): qty 150

## 2024-07-25 MED ORDER — VANCOMYCIN HCL 1500 MG/300ML IV SOLN
1500.0000 mg | Freq: Once | INTRAVENOUS | Status: AC
  Administered 2024-07-25: 1500 mg via INTRAVENOUS
  Filled 2024-07-25: qty 300

## 2024-07-25 MED ORDER — SODIUM CHLORIDE 0.9 % IV SOLN
INTRAVENOUS | Status: DC
Start: 2024-07-26 — End: 2024-07-26

## 2024-07-25 MED ORDER — VANCOMYCIN HCL 1250 MG/250ML IV SOLN: 1250.0000 mg | INTRAVENOUS | Status: DC

## 2024-07-25 MED ORDER — CLEVIDIPINE BUTYRATE 0.5 MG/ML IV EMUL
0.0000 mg/h | INTRAVENOUS | Status: DC
  Administered 2024-07-25: 12 mg/h via INTRAVENOUS
  Administered 2024-07-25: 2 mg/h via INTRAVENOUS
  Administered 2024-07-26: 14 mg/h via INTRAVENOUS
  Administered 2024-07-26: 3 mg/h via INTRAVENOUS
  Administered 2024-07-26 (×2): 10 mg/h via INTRAVENOUS
  Administered 2024-07-27: 5 mg/h via INTRAVENOUS
  Administered 2024-07-27: 7 mg/h via INTRAVENOUS
  Administered 2024-07-28: 2 mg/h via INTRAVENOUS
  Filled 2024-07-25 (×7): qty 50
  Filled 2024-07-25: qty 100
  Filled 2024-07-25: qty 50

## 2024-07-25 MED ORDER — LACTATED RINGERS IV SOLN: INTRAVENOUS | Status: AC

## 2024-07-25 MED ORDER — LACTATED RINGERS IV BOLUS (SEPSIS)
250.0000 mL | Freq: Once | INTRAVENOUS | Status: AC
  Administered 2024-07-25: 250 mL via INTRAVENOUS

## 2024-07-25 MED ORDER — LABETALOL HCL 5 MG/ML IV SOLN
20.0000 mg | Freq: Once | INTRAVENOUS | Status: AC
  Administered 2024-07-25: 20 mg via INTRAVENOUS
  Filled 2024-07-25: qty 4

## 2024-07-25 MED ORDER — SODIUM CHLORIDE 0.9 % IV SOLN
2.0000 g | INTRAVENOUS | Status: DC
Start: 2024-07-25 — End: 2024-07-25
  Filled 2024-07-25 (×4): qty 2000

## 2024-07-25 MED ORDER — IOHEXOL 350 MG/ML SOLN
75.0000 mL | Freq: Once | INTRAVENOUS | Status: AC | PRN
  Administered 2024-07-25: 60 mL via INTRAVENOUS

## 2024-07-25 MED ORDER — POTASSIUM CHLORIDE CRYS ER 20 MEQ PO TBCR
40.0000 meq | EXTENDED_RELEASE_TABLET | Freq: Once | ORAL | Status: AC
  Administered 2024-07-25: 40 meq via ORAL
  Filled 2024-07-25: qty 2

## 2024-07-25 MED ORDER — ONDANSETRON HCL 4 MG PO TABS: 4.0000 mg | ORAL_TABLET | Freq: Four times a day (QID) | ORAL | Status: DC | PRN

## 2024-07-25 MED ORDER — LACTATED RINGERS IV BOLUS (SEPSIS)
1000.0000 mL | Freq: Once | INTRAVENOUS | Status: AC
  Administered 2024-07-25: 1000 mL via INTRAVENOUS

## 2024-07-25 MED ORDER — AMLODIPINE BESYLATE 5 MG PO TABS: 5.0000 mg | ORAL_TABLET | Freq: Every day | ORAL | Status: DC

## 2024-07-25 MED ORDER — ONDANSETRON HCL 4 MG/2ML IJ SOLN: 4.0000 mg | Freq: Four times a day (QID) | INTRAMUSCULAR | Status: DC | PRN

## 2024-07-25 MED ORDER — SODIUM CHLORIDE 0.9 % IV SOLN: 2.0000 g | Freq: Two times a day (BID) | INTRAVENOUS | Status: DC

## 2024-07-25 MED ORDER — PANTOPRAZOLE SODIUM 40 MG PO TBEC: 40.0000 mg | DELAYED_RELEASE_TABLET | Freq: Every day | ORAL | Status: DC

## 2024-07-25 MED ORDER — SODIUM CHLORIDE 0.9 % IV SOLN
2.0000 g | Freq: Once | INTRAVENOUS | Status: AC
  Administered 2024-07-25: 2 g via INTRAVENOUS
  Filled 2024-07-25: qty 20

## 2024-07-25 NOTE — Progress Notes (Signed)
 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 Lanis

## 2024-07-25 NOTE — Assessment & Plan Note (Addendum)
-  SIRS criteria met with  elevated white blood cell count,       Component Value Date/Time   WBC 17.8 (H) 07/25/2024 1602   LYMPHSABS 2.0 07/25/2024 1602     tachycardia   RR >20 Today's Vitals   07/25/24 1553 07/25/24 1554 07/25/24 1630 07/25/24 1735  BP:  (!) 165/130 (!) 181/108 (!) 203/113  Pulse:  (!) 103 93 80  Resp:  19 (!) 21 18  Temp:  99.9 F (37.7 C)    TempSrc:  Oral    SpO2:  100% 99% 98%  Weight: 67 kg     Height: 5' 6 (1.676 m)     PainSc:         The recent clinical data is shown below. Vitals:   07/25/24 1553 07/25/24 1554 07/25/24 1630 07/25/24 1735  BP:  (!) 165/130 (!) 181/108 (!) 203/113  Pulse:  (!) 103 93 80  Resp:  19 (!) 21 18  Temp:  99.9 F (37.7 C)    TempSrc:  Oral    SpO2:  100% 99% 98%  Weight: 67 kg     Height: 5' 6 (1.676 m)         Source of sepsis is unknown but given clinical picture will continue to treat   Patient meeting criteria for Severe sepsis with    evidence of end organ damage/organ dysfunction such as  elevated lactic acid >2     Component Value Date/Time   LATICACIDVEN 4.2 (HH) 07/25/2024 1849   acute metabolic encephalopathy   Patient is meeting criteria for SEPTIC SHOCK with  lactic acid > 4     - Obtain serial lactic acid and procalcitonin level.  - Initiated IV antibiotics in ER: Antibiotics Given (last 72 hours)     Date/Time Action Medication Dose Rate   07/25/24 1633 New Bag/Given   cefTRIAXone  (ROCEPHIN ) 2 g in sodium chloride  0.9 % 100 mL IVPB 2 g 200 mL/hr       Will continue  on : Broad-spectrum antibiotic biotics for tonight cefepime vanc and metronidazole    - await results of blood and urine culture  - Rehydrate aggressively  Intravenous fluids were administered  Obtain additional imaging to evaluate for any source of infection such as CT chest and abdomen If no improvement may benefit from PCCM consult   7:15 PM

## 2024-07-25 NOTE — ED Notes (Signed)
 Advised MD Rogelia that Lactic was elevated at 2.99

## 2024-07-25 NOTE — ED Provider Notes (Signed)
 [place] EMERGENCY DEPARTMENT AT [facility] Provider Note   CSN: [id number] Arrival date & time: 07/25/24  1544     History Chief Complaint  Patient presents with   Weakness     Weakness  HPI: Daisy [name] is a 68 y.o. female with history pertinent for hypokalemia, carpal tunnel syndrome, hypertension, who presents complaining of altered mental status, weakness. Patient arrived via EMS from home.  History provided by patient and EMS.  No interpreter required during this encounter.  Per EMS, they were called to the scene by patient's son.  Per patient's son per EMS, patient reportedly has had malaise and fatigue for approximately 1 week, however over the past 24 to 48 hours she has had progressive weakness and confusion.  EMS reports that upon their arrival patient was laying on the ground, drenched in her urine.  Reports that she was tachycardic en route, however saturating well on room air, and felt warm to their touch with a oral temperature of 99.1.  Reports that they administered 200 cc of crystalloids prior to arrival.  Patient reports that she hurts everywhere, however denies specific chest pain, shortness of breath, abdominal pain, headache, etc.  Patient's recorded medical, surgical, social, medication list and allergies were reviewed in the Snapshot window as part of the initial history.   Prior to Admission medications   Medication Sig Start Date End Date Taking? Authorizing Provider  amLODipine  (NORVASC ) 10 MG tablet Take 10 mg by mouth daily.   Yes [provider]  Aspirin-Salicylamide-Caffeine (BC HEADACHE POWDER PO) Take 1-2 packets by mouth See admin instructions. Take 1-2 packets every hour as needed for pain   Yes [provider]  clotrimazole-betamethasone (LOTRISONE) cream Apply 1 Application topically 2 (two) times daily.   Yes [provider]  losartan-hydrochlorothiazide  (HYZAAR) 100-12.5 MG tablet Take 1 tablet by  mouth daily. 04/22/24  Yes [provider]  mirtazapine (REMERON) 15 MG tablet Take 15 mg by mouth at bedtime.   Yes [provider]  aspirin EC 81 MG tablet Take 81 mg by mouth daily. Swallow whole. Patient not taking: Reported on 07/26/2024    [provider]  HYDROcodone-acetaminophen  (NORCO/VICODIN) 5-325 MG tablet Take 1 tablet by mouth every 6 (six) hours as needed. Patient not taking: Reported on 07/26/2024 06/29/24   [provider]  omeprazole (PRILOSEC) 40 MG capsule Take 1 tablet by mouth daily. Patient not taking: Reported on 07/26/2024    [provider]  triamcinolone  ointment (KENALOG ) 0.1 % Apply 1 Application topically daily. Patient not taking: Reported on 07/26/2024    [provider]     Allergies: Codeine   Review of Systems   ROS as per HPI  Physical Exam Updated Vital Signs BP (!) 140/88   Pulse 100   Temp 99.4 F (37.4 C) (Axillary)   Resp 18   Ht 5' 6 (1.676 m)   Wt 67 kg Comment: bed not weighing  SpO2 95%   BMI 23.84 kg/m  Physical Exam Vitals and nursing note reviewed.  Constitutional:      General: She is not in acute distress.    Appearance: She is well-developed.  HENT:     Head: Normocephalic and atraumatic.  Eyes:     Conjunctiva/sclera: Conjunctivae normal.  Neck:     Meningeal: Brudzinski's sign and Kernig's sign absent.  Cardiovascular:     Rate and Rhythm: Normal rate and regular rhythm.     Heart sounds: No murmur heard. Pulmonary:  Effort: Pulmonary effort is normal. No respiratory distress.     Breath sounds: Normal breath sounds.  Abdominal:     Palpations: Abdomen is soft.     Tenderness: There is no abdominal tenderness.  Musculoskeletal:        General: No swelling.     Cervical back: Neck supple. No rigidity.  Skin:    General: Skin is warm and dry.     Capillary Refill: Capillary refill takes less than 2 seconds.  Neurological:     Mental Status: She is alert.      Comments: Oriented to person, place, situation, disoriented to time  Psychiatric:        Mood and Affect: Mood normal.     ED Course/ Medical Decision Making/ A&P  Procedures Procedures   Medications Ordered in ED Medications  lactated ringers  infusion (0 mLs Intravenous Stopped 07/26/24 0939)  ondansetron  (ZOFRAN ) tablet 4 mg ( Oral MAR Unhold 07/26/24 1901)  vancomycin  (VANCOREADY) IVPB 750 mg/150 mL (0 mg Intravenous Stopped 07/26/24 2233)  Chlorhexidine  Gluconate Cloth 2 % PADS 6 each (6 each Topical Given 07/26/24 2100)  clevidipine  (CLEVIPREX ) infusion 0.5 mg/mL (3 mg/hr Intravenous Infusion Verify 07/27/24 0300)  insulin  aspart (novoLOG ) injection 0-9 Units ( Subcutaneous Not Given 07/26/24 2343)  morphine  (PF) 2 MG/ML injection 2 mg ( Intravenous MAR Unhold 07/26/24 1901)  cefTRIAXone  (ROCEPHIN ) 1 g in sodium chloride  0.9 % 100 mL IVPB (0 g Intravenous Stopped 07/26/24 2007)  pantoprazole  (PROTONIX ) injection 40 mg (40 mg Intravenous Given 07/26/24 2130)  thiamine  (VITAMIN B1) injection 100 mg ( Intravenous MAR Unhold 07/26/24 1901)  folic acid  injection 1 mg ( Intravenous MAR Unhold 07/26/24 1901)   stroke: early stages of recovery book ( Does not apply MAR Unhold 07/26/24 1901)  0.9 %  sodium chloride  infusion ( Intravenous Infusion Verify 07/27/24 0300)  niMODipine  (NIMOTOP ) capsule 60 mg ( Oral See Alternative 07/27/24 0100)    Or  niMODipine  (NYMALIZE ) 6 MG/ML oral solution 60 mg (60 mg Per Tube Given 07/27/24 0100)  levETIRAcetam  (KEPPRA ) undiluted injection 500 mg (500 mg Intravenous Given 07/26/24 2056)  Oral care mouth rinse (15 mLs Mouth Rinse Given 07/26/24 2134)  lactated ringers  bolus 1,000 mL (0 mLs Intravenous Stopped 07/25/24 1734)    And  lactated ringers  bolus 1,000 mL (0 mLs Intravenous Stopped 07/25/24 1913)    And  lactated ringers  bolus 250 mL (0 mLs Intravenous Stopped 07/25/24 1734)  cefTRIAXone  (ROCEPHIN ) 2 g in sodium chloride  0.9 % 100 mL IVPB (0 g Intravenous Stopped 07/25/24 1735)   potassium chloride  10 mEq in 100 mL IVPB (0 mEq Intravenous Stopped 07/25/24 1920)  potassium chloride  SA (KLOR-CON  M) CR tablet 40 mEq (40 mEq Oral Given 07/25/24 1732)  iohexol  (OMNIPAQUE ) 350 MG/ML injection 100 mL (100 mLs Intravenous Contrast Given 07/25/24 1925)  vancomycin  (VANCOREADY) IVPB 1500 mg/300 mL (0 mg Intravenous Stopped 07/25/24 2349)  labetalol  (NORMODYNE ) injection 20 mg (20 mg Intravenous Given 07/25/24 2123)  iohexol  (OMNIPAQUE ) 350 MG/ML injection 75 mL (60 mLs Intravenous Contrast Given 07/25/24 2038)  ceFAZolin  (ANCEF ) IVPB 2g/100 mL premix (0 g Intravenous Stopped 07/26/24 0202)  lidocaine -EPINEPHrine  (XYLOCAINE  W/EPI) 1 %-1:100000 (with pres) injection 10 mL (10 mLs Intradermal Given 07/26/24 0214)  midazolam  (VERSED ) 2 MG/2ML injection (  Duplicate 07/26/24 0214)  fentaNYL  (SUBLIMAZE ) 50 MCG/ML injection (  Duplicate 07/26/24 0214)  midazolam  (VERSED ) injection 2 mg (2 mg Intravenous Given 07/26/24 0129)  fentaNYL  (SUBLIMAZE ) injection 50 mcg (50 mcg Intravenous Given 07/26/24 0128)  fentaNYL  (SUBLIMAZE ) injection 50 mcg (50 mcg Intravenous Given 07/26/24 0138)  potassium chloride  10 mEq in 100 mL IVPB (0 mEq Intravenous Stopped 07/26/24 0900)  magnesium  sulfate 6 g in dextrose  5 % 100 mL IVPB (0 g Intravenous Stopped 07/26/24 0425)  iohexol  (OMNIPAQUE ) 300 MG/ML solution 150 mL ( Intra-arterial MAR Unhold 07/26/24 1901)    Medical Decision Making:   Daisy [name] is a 68 y.o. female who presents for malaise and weakness as per above.  Physical exam is pertinent for disorientation to time, otherwise no focal symptoms.   The differential includes but is not limited to pneumonia, sepsis, UTI, metabolic encephalopathy, meningitis, encephalitis.  Independent historian: EMS  External data reviewed: Labs: Reviewed patient's baseline labs, patient with history of hypokalemia, however generally in the low 3s to upper 2s  Initial Plan:  Screening labs including CBC and Metabolic panel to  evaluate for infectious or metabolic etiology of disease.  Urinalysis with reflex culture ordered to evaluate for UTI or relevant urologic/nephrologic pathology.  CXR to evaluate for structural/infectious intrathoracic pathology.  EKG and serial troponin to evaluate for cardiac pathology.  Labs: Ordered, Independent interpretation, and Details: Patient lactic acid to 3.7.  Initial troponinmildly elevated at 23, delta undetectable.  UDS positive for THC.  CK mildly elevated at 284.  UA without UTI with negative nitrites and LE.  Lipase WNL.  Blood cultures WNL.  PT mildly elevated at 15.7, INR WNL.  CBC with leukocytosis to 17.8 with left shift, no anemia thrombocytopenia.  CMP without AKI or emergent LFT abnormality, hypokalemia progressed from baseline.  Respiratory viral panel negative.  Radiology: Ordered, Independent interpretation, and Details: CT head with hyperintensity along multiple sulci most prominently in the left temporal lobe concerning for [facility] DG Abd 1 View Result Date: 07/26/2024 CLINICAL DATA:  Nasogastric tube placement. EXAM: ABDOMEN - 1 VIEW COMPARISON:  None Available. FINDINGS: Tip and side port of the enteric tube below the diaphragm in the stomach. Air within nondilated small and large bowel. Right upper quadrant surgical clips. IMPRESSION: Tip and side port of the enteric tube below the diaphragm in the stomach. Electronically Signed   By: Andrea Gasman M.D.   On: 07/26/2024 21:21   CT HEAD WO CONTRAST ( ) Result Date: 07/26/2024 EXAM: CT HEAD WITHOUT CONTRAST 07/26/2024 08:16:28 PM TECHNIQUE: CT of the head was performed without the administration of intravenous contrast. Automated exposure control, iterative reconstruction, and/or weight based adjustment of the mA/kV was utilized to reduce the radiation dose to as low as reasonably achievable. COMPARISON: None available. CLINICAL HISTORY: Stroke, follow up. FINDINGS: BRAIN AND VENTRICLES: Unchanged appearance of bilateral  subarachnoid hemorrhage. Extraventricular drainage catheter in an unchanged position near the foramina of [place]. Slightly decreased size of the frontal horns of the lateral ventricles. No new site of hemorrhage. ORBITS: No acute abnormality. SINUSES: No acute abnormality. SOFT TISSUES AND SKULL: No acute soft tissue abnormality. No skull fracture. VASCULATURE: Embolization coils at the anterior communicating artery. IMPRESSION: 1. Unchanged appearance of bilateral subarachnoid hemorrhage. 2. No new site of hemorrhage. 3. Embolization coils at the anterior communicating artery. 4. Extraventricular drainage catheter in unchanged position near the foramina of [place]. 5. Slightly decreased size of the frontal horns of the lateral ventricles. Electronically signed by: Franky Stanford MD 07/26/2024 08:24 PM EDT RP Workstation: [id number]   CT HEAD WO CONTRAST ( ) Result Date: 07/26/2024 CLINICAL DATA:  68 year old female with subarachnoid hemorrhage, ruptured anterior communicating artery aneurysm. Status post EVD. EXAM: CT HEAD WITHOUT CONTRAST  TECHNIQUE: Contiguous axial images were obtained from the base of the skull through the vertex without intravenous contrast. RADIATION DOSE REDUCTION: This exam was performed according to the departmental dose-optimization program which includes automated exposure control, adjustment of the mA and/or kV according to patient size and/or use of iterative reconstruction technique. COMPARISON:  CTA head and neck and head CT yesterday. FINDINGS: Brain: New right superior frontal approach external ventricular drain, which passes through the right frontal horn and terminates at the 3rd ventricle near midline (coronal images 35 and 38). Trace postoperative pneumocephalus. Stable to slightly decreased ventricle size since presentation (coronal image 32 now versus series 6, image 22 at 1920 hours yesterday. No IVH. No transependymal edema. Bilateral hyperdense subarachnoid hemorrhage does  not appear significantly changed, most pronounced in the suprasellar cistern, prepontine cistern and tracking in both sylvian fissures. Basilar cisterns otherwise patent. No significant intracranial mass effect, no midline shift. Stable gray-white matter differentiation throughout the brain. Vascular: Calcified atherosclerosis at the skull base. Skull: New right vertex burr hole.  Otherwise intact. Sinuses/Orbits: Mild paranasal sinus mucosal thickening is stable. Tympanic cavities and mastoids remain well aerated. Other: New postoperative changes right vertex. Visualized orbit soft tissues are within normal limits. IMPRESSION: 1. New right superior frontal approach EVD with no adverse features. Stable to slightly decreased lateral and 3rd ventricle size. 2. Stable bilateral subarachnoid hemorrhage. 3. No new intracranial abnormality. Electronically Signed   By: VEAR Hurst M.D.   On: 07/26/2024 05:15   CT ANGIO HEAD NECK W WO CM Result Date: 07/25/2024 CLINICAL DATA:  Follow-up examination for subarachnoid hemorrhage. EXAM: CT ANGIOGRAPHY HEAD AND NECK WITH AND WITHOUT CONTRAST TECHNIQUE: Multidetector CT imaging of the head and neck was performed using the standard protocol during bolus administration of intravenous contrast. Multiplanar CT image reconstructions and MIPs were obtained to evaluate the vascular anatomy. Carotid stenosis measurements (when applicable) are obtained utilizing NASCET criteria, using the distal internal carotid diameter as the denominator. RADIATION DOSE REDUCTION: This exam was performed according to the departmental dose-optimization program which includes automated exposure control, adjustment of the mA and/or kV according to patient size and/or use of iterative reconstruction technique. CONTRAST:  60mL OMNIPAQUE  IOHEXOL  350 MG/ML SOLN COMPARISON:  Prior CT from earlier the same day. FINDINGS: CTA NECK FINDINGS Aortic arch: Standard branching. Imaged portion shows no evidence of  aneurysm or dissection. No significant stenosis of the major arch vessel origins. Right carotid system: No evidence of dissection, stenosis (50% or greater), or occlusion. Left carotid system: No evidence of dissection, stenosis (50% or greater), or occlusion. Vertebral arteries: Left vertebral artery dominant. No evidence of dissection, stenosis (50% or greater), or occlusion. Skeleton: No worrisome osseous lesions.  Patient is edentulous. Other neck: No other acute finding. Upper chest: No other acute finding. Review of the MIP images confirms the above findings CTA HEAD FINDINGS Anterior circulation: Atheromatous change about the carotid siphons without hemodynamically significant stenosis. A1 segments patent bilaterally, with the left being slightly dominant. Saccular aneurysm extending superiorly and towards the right arises from the anterior communicating artery complex, measuring 3 x 3 x 6 mm, likely the source of subarachnoid hemorrhage (series 9, image 20). Both ACAs patent distally without significant stenosis. M1 segments patent without stenosis. The proximal MCA branch occlusions are somewhat narrowed and attenuated, greater on the left, potentially reflecting changes of vaso spasm given the underlying subarachnoid hemorrhage. No visible proximal MCA branch occlusion. Distal MCA branches perfused and symmetric. Posterior circulation: Left V4 segment dominant and  patent without stenosis. Left PICA patent. Right vertebral artery largely terminates in PICA. Right PICA patent as well. Basilar mildly irregular but patent without significant stenosis. Superior cerebral arteries patent bilaterally. Left PCA is irregular and attenuated, potentially reflecting a degree of vasospasm. PCAs otherwise patent to their distal aspects. Venous sinuses: Grossly patent allowing for timing of contrast bolus. Anatomic variants: As above. Review of the MIP images confirms the above findings IMPRESSION: 1. 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 2. Mild irregularity and attenuation of the proximal MCA branches and left PCA, potentially reflecting a degree of vasospasm given the underlying subarachnoid hemorrhage. No large vessel occlusion. 3. Otherwise wide patency of the major arterial vasculature of the head and neck. No other hemodynamically significant or correctable stenosis. Electronically Signed   By: Morene Hoard M.D.   On: 07/25/2024 21:18   CT HEAD WO CONTRAST ( ) Result Date: 07/25/2024 CLINICAL DATA:  Delirium. EXAM: CT HEAD WITHOUT CONTRAST TECHNIQUE: Contiguous axial images were obtained from the base of the skull through the vertex without intravenous contrast. RADIATION DOSE REDUCTION: This exam was performed according to the departmental dose-optimization program which includes automated exposure control, adjustment of the mA and/or kV according to patient size and/or use of iterative reconstruction technique. COMPARISON:  None Available. FINDINGS: Brain: Mild diffuse increased density throughout the sulci typical of subarachnoid hemorrhage, more so in the left temporal region series 3, image 11. The ventricular system is mildly prominent. Questionable trace blood within the occipital horn of the left lateral ventricle. No subdural or extra-axial collection. No evidence of acute ischemia. Vascular: Rounded hyperdensity in the region of the anterior communicating artery, series 3, image 8, may represent aneurysm. There is no hyperdense vessel. Skull: No fracture or focal lesion. Sinuses/Orbits: Minor mucosal thickening of paranasal sinuses. No mastoid effusion. Other: None. IMPRESSION: 1. Mild diffuse increased density throughout the sulci typical of subarachnoid hemorrhage, more so in the left temporal region. 2. Rounded hyperdensity in the region of the anterior communicating artery may represent aneurysm.  Recommend further evaluation with CTA. 3. Mild prominence of the ventricular system. Questionable trace blood within the occipital horn of the left lateral ventricle. Recommend attention at follow-up. Critical Value/emergent results were called by telephone at the time of interpretation on 07/25/2024 at 7:54 pm to provider [facility] , who verbally acknowledged these results. Electronically Signed   By: Andrea Gasman M.D.   On: 07/25/2024 19:54   CT Angio Chest Pulmonary Embolism (PE) W or WO Contrast Result Date: 07/25/2024 CLINICAL DATA:  Sepsis and weakness.  High probability for PE. EXAM: CT ANGIOGRAPHY CHEST CT ABDOMEN AND PELVIS WITH CONTRAST TECHNIQUE: Multidetector CT imaging of the chest was performed using the standard protocol during bolus administration of intravenous contrast. Multiplanar CT image reconstructions and MIPs were obtained to evaluate the vascular anatomy. Multidetector CT imaging of the abdomen and pelvis was performed using the standard protocol during bolus administration of intravenous contrast. RADIATION DOSE REDUCTION: This exam was performed according to the departmental dose-optimization program which includes automated exposure control, adjustment of the mA and/or kV according to patient size and/or use of iterative reconstruction technique. CONTRAST:  OMNIPAQUE  IOHEXOL  350 MG/ML SOLN COMPARISON:  CT abdomen and pelvis 01/17/2015. FINDINGS: CTA CHEST FINDINGS Cardiovascular: Satisfactory opacification of the pulmonary arteries to the segmental level. No evidence of pulmonary embolism. Normal heart size. No pericardial effusion. Mediastinum/Nodes: No enlarged mediastinal, hilar, or  axillary lymph nodes. Thyroid gland, trachea, and esophagus demonstrate no significant findings. Lungs/Pleura: There is minimal bibasilar atelectasis. The lungs are otherwise clear. There is no pleural effusion or pneumothorax. Musculoskeletal: No chest wall abnormality. No acute or significant  osseous findings. Review of the MIP images confirms the above findings. CT ABDOMEN and PELVIS FINDINGS Hepatobiliary: No focal liver abnormality is seen. Status post cholecystectomy. No biliary dilatation. Pancreas: Unremarkable. No pancreatic ductal dilatation or surrounding inflammatory changes. Spleen: Normal in size without focal abnormality. Adrenals/Urinary Tract: There is a subcentimeter hypodensity in each kidney. These are too small to characterize, but favored as cysts. Otherwise, the kidneys, adrenal glands and bladder are within normal limits. Stomach/Bowel: Stomach is within normal limits. Appendix appears normal. No evidence of bowel wall thickening, distention, or inflammatory changes. Vascular/Lymphatic: Aortic atherosclerosis. No enlarged abdominal or pelvic lymph nodes. Reproductive: Uterus and bilateral adnexa are unremarkable. Other: No abdominal wall hernia or abnormality. No abdominopelvic ascites. Musculoskeletal: No acute osseous findings. Degenerative changes affect the hips and spine. Review of the MIP images confirms the above findings. IMPRESSION: 1. No evidence for pulmonary embolism. 2. No acute localizing process in the chest, abdomen or pelvis. 3. Subcentimeter likely renal cysts, too small to characterize. No follow-up imaging recommended. Aortic Atherosclerosis (ICD10-I70.0). Electronically Signed   By: Greig Pique M.D.   On: 07/25/2024 19:50   CT ABDOMEN PELVIS W CONTRAST Result Date: 07/25/2024 CLINICAL DATA:  Sepsis and weakness.  High probability for PE. EXAM: CT ANGIOGRAPHY CHEST CT ABDOMEN AND PELVIS WITH CONTRAST TECHNIQUE: Multidetector CT imaging of the chest was performed using the standard protocol during bolus administration of intravenous contrast. Multiplanar CT image reconstructions and MIPs were obtained to evaluate the vascular anatomy. Multidetector CT imaging of the abdomen and pelvis was performed using the standard protocol during bolus administration of  intravenous contrast. RADIATION DOSE REDUCTION: This exam was performed according to the departmental dose-optimization program which includes automated exposure control, adjustment of the mA and/or kV according to patient size and/or use of iterative reconstruction technique. CONTRAST:  OMNIPAQUE  IOHEXOL  350 MG/ML SOLN COMPARISON:  CT abdomen and pelvis 01/17/2015. FINDINGS: CTA CHEST FINDINGS Cardiovascular: Satisfactory opacification of the pulmonary arteries to the segmental level. No evidence of pulmonary embolism. Normal heart size. No pericardial effusion. Mediastinum/Nodes: No enlarged mediastinal, hilar, or axillary lymph nodes. Thyroid gland, trachea, and esophagus demonstrate no significant findings. Lungs/Pleura: There is minimal bibasilar atelectasis. The lungs are otherwise clear. There is no pleural effusion or pneumothorax. Musculoskeletal: No chest wall abnormality. No acute or significant osseous findings. Review of the MIP images confirms the above findings. CT ABDOMEN and PELVIS FINDINGS Hepatobiliary: No focal liver abnormality is seen. Status post cholecystectomy. No biliary dilatation. Pancreas: Unremarkable. No pancreatic ductal dilatation or surrounding inflammatory changes. Spleen: Normal in size without focal abnormality. Adrenals/Urinary Tract: There is a subcentimeter hypodensity in each kidney. These are too small to characterize, but favored as cysts. Otherwise, the kidneys, adrenal glands and bladder are within normal limits. Stomach/Bowel: Stomach is within normal limits. Appendix appears normal. No evidence of bowel wall thickening, distention, or inflammatory changes. Vascular/Lymphatic: Aortic atherosclerosis. No enlarged abdominal or pelvic lymph nodes. Reproductive: Uterus and bilateral adnexa are unremarkable. Other: No abdominal wall hernia or abnormality. No abdominopelvic ascites. Musculoskeletal: No acute osseous findings. Degenerative changes affect the hips and  spine. Review of the MIP images confirms the above findings. IMPRESSION: 1. No evidence for pulmonary embolism. 2. No acute localizing process in the chest, abdomen or pelvis. 3.  Subcentimeter likely renal cysts, too small to characterize. No follow-up imaging recommended. Aortic Atherosclerosis (ICD10-I70.0). Electronically Signed   By: Greig Pique M.D.   On: 07/25/2024 19:50   DG Chest Port 1 View Result Date: 07/25/2024 CLINICAL DATA:  Questionable sepsis - evaluate for abnormality Weakness.  Confusion. EXAM: PORTABLE CHEST 1 VIEW COMPARISON:  11/24/2017 FINDINGS: The cardiomediastinal contours are normal. The lungs are clear. Pulmonary vasculature is normal. No consolidation, pleural effusion, or pneumothorax. No acute osseous abnormalities are seen. Overlying clothing artifact, unable to be removed. IMPRESSION: No active disease. Electronically Signed   By: Andrea Gasman M.D.   On: 07/25/2024 16:20                   Interventions: Potassium repletion, LR, Rocephin , vancomycin , labetalol , Cleviprex   See the EMR for full details regarding lab and imaging results.  Patient presents for weakness, and incontinence of urine prior to arrival.  On exam, patient with abnormal vitals meeting criteria for SIRS, therefore activated as a Code Sepsis on arrival.  Patient administered broad-spectrum antibiotics and labs ordered.  Patient does not have any localizing complaints or findings on exam outside of urinary incontinence.  Patient alert and oriented to all but time, denies focal pain.  Negative Brudzinski's and Kernig's on exam, therefore doubt meningitis.  Labs obtained and are remarkable for significant leukocytosis, as well as lactic acid elevation.  Labs also demonstrate hypokalemia worsened from baseline, which was repleted with oral and IV potassium.  Patient had persistent lactic acid elevation despite 30 cc/kg, however had uptrending rather than downtrending blood pressures while in the ED,  and labetalol  was ordered.  Labs otherwise reassuring, and workup does not localize focal source of infection.  Given nonspecific symptoms and labs consistent with sepsis, initially medicine consulted for admission, and accepted by Dr. Silvester.  As part of admission to evaluate for source of infection, Dr. Silvester ordered pan scans including CT scan of the head which unfortunately did reveal subarachnoid hemorrhage, and further collateral revealed that patient has a history of cocaine use disorder, though notably UDS negative for cocaine presently.  Given these new insights, medicine admission was rescinded, CTA head was obtained, neurosurgery was consulted, and case was discussed with Dr. Darnella.  Patient remained hypertensive, therefore was initiated on Cleviprex .  CTA demonstrates a saccular aneurysm, therefore surgery feels that patient would be best served admission to their service at the neuro ICU.  Admission was placed, patient had improving blood pressure control on Cleviprex , and ultimately was transported by [facility] to [facility] for admission to neuro ICU.  Presentation is most consistent with acute life/limb-threatening illness  Discussion of management or test interpretations with external provider(s): Hospitalist, Dr. Silvester, neurosurgery, Dr. Darnella  Risk Drugs:Prescription drug management, Parenteral controlled substances, and Drug therapy requiring intensive monitoring for toxicity Treatment: Decision regarding hospitalization Critical Care: 37  Disposition: CRITICAL: The patient is critically-ill and requires intensive care and was admitted to ICU at [facility] under neurosurgery service. Please see inpatient provider note for additional treatment plan details.   MDM generated using voice dictation software and may contain dictation errors.  Please contact me for any clarification or with any questions.  Clinical Impression:  1. SAH (subarachnoid hemorrhage) (HCC)   2. SIRS  (systemic inflammatory response syndrome) (HCC)      Admit   Final Clinical Impression(s) / ED Diagnoses Final diagnoses:  SAH (subarachnoid hemorrhage) (HCC)    Rx / DC Orders ED Discharge Orders  None        Rogelia Jerilynn RAMAN, MD 07/27/24 [phone]

## 2024-07-25 NOTE — Assessment & Plan Note (Signed)
-   will replace electrolytes and repeat  check Mg, phos and Ca level and replace as needed Monitor on telemetry   Lab Results  Component Value Date   K 2.6 (LL) 07/25/2024     Lab Results  Component Value Date   CREATININE 0.77 07/25/2024   No results found for: MG Lab Results  Component Value Date   CALCIUM 9.9 07/25/2024

## 2024-07-25 NOTE — Subjective & Objective (Signed)
 Family reports she has been sick for the past 1 wk has been having increased fatigue Today started to have confusion Denies any headache no meningismus no fevers or chills patient not endorsing any complaints When EMS arrived patient was found to be drenched in her urine noted to be tachycardic oral temperature 99.1

## 2024-07-25 NOTE — ED Notes (Signed)
 [facility] called to set up transport for transfer.

## 2024-07-25 NOTE — Progress Notes (Signed)
[facility] following for sepsis protocol. ?

## 2024-07-25 NOTE — ED Notes (Signed)
 Pt's belongings containing hat and clothing, bagged and sent with pt for transport to [facility].

## 2024-07-25 NOTE — Progress Notes (Addendum)
 Pharmacy Antibiotic Note  Daisy [name] is a 68 y.o. female admitted on 07/25/2024 with sepsis.  Pharmacy has been consulted for vancomycin  dosing.  Plan: Cefepime 2 g IV q8h + metronidazole  500 mg IV q12h Vancomycin  1500 mg loading dose followed by 1250 mg IV q24h for estimated AUC of 445 Goal AUC 400-550. Check levels as needed Monitor renal function, culture data  Height: 5' 6 (167.6 cm) Weight: 67 kg (147 lb 11.3 oz) IBW/kg (Calculated) : 59.3  Temp (24hrs), Avg:99.9 F (37.7 C), Min:99.9 F (37.7 C), Max:99.9 F (37.7 C)  Recent Labs  Lab 07/25/24 1602 07/25/24 1635 07/25/24 1849  WBC 17.8*  --   --   CREATININE 0.77  --   --   LATICACIDVEN  --  3.0* 4.2*    Estimated Creatinine Clearance: 64.8 mL/min (by C-G formula based on SCr of 0.77 mg/dL).    Allergies  Allergen Reactions   Codeine     REACTION: mental status change    Antimicrobials this admission: Ceftriaxone  x1 8/31  cefepime 8/31 >>  Vancomycin  8/31 >>  Dose adjustments this admission:  Microbiology results: 8/31 BCx:   Ronal CHRISTELLA Rav, PharmD 07/25/2024 7:24 PM  Addendum: Due to pt complaint of neck stiffness, headache, AMS; empiric antibiotics changing to cover for potential meningitis.   Discontinue cefepime and metronidazole  Start ceftriaxone  2 g IV q12h Adjust vancomycin  dosing to target VT, not AUC Vanc 1500 mg loading dose followed by 750 mg IV q12h for goal VT 15-20 Ampicillin  2 g IV q4h Acyclovir  10 mg/kg (650 mg) IV q8h LR @ 150 mL/hr  Follow renal function, cultures.   Ronal CHRISTELLA Rav, PharmD 07/25/24 7:52 PM

## 2024-07-25 NOTE — ED Notes (Signed)
 This charge RN got called into room by admitting doc to assist in getting patient off the bedpan.  Patient removed from bedban and peri care done.  Sheets noted to be wet, so sheets were changed and clean warm blankets provided.  Patient upset and stated that she felt like she was being neglected.  Therapeutic listening provided and concerns were heard.  Patient thankful for this charge nurse listening and providing care.

## 2024-07-25 NOTE — ED Notes (Signed)
 Repeat labs needed for I-stat, questionable results.

## 2024-07-25 NOTE — Plan of Care (Signed)
 Daisy [name] FMW:[id number] DOB: 1956-02-24 DOA: 07/25/2024     PCP: Darra Hamilton, PA-C   Outpatient Specialists: * NONE CARDS: * Dr. None  NEphrology: *  Dr. No care team member to display  NEurology *   Dr. Pulmonary *  Dr.  Oncology * Dr.No care team member to display  GI* Dr.  Gwen, LB) No care team member to display Urology Dr. *  Patient arrived to ER on 07/25/24 at 1544 Referred by Attending Silvester Ales, MD   Patient coming from:    home Lives  With family     Chief Complaint:   Chief Complaint  Patient presents with   Weakness    HPI: Daisy [name] is a 68 y.o. female with medical history significant of HTN, etoh abuse, polysubstance abuse, COPD, chronic back pain     Presented with   confusion Family reports she has been sick for the past 1 wk has been having increased fatigue Today started to have confusion Denies any headache no meningismus no fevers or chills patient not endorsing any complaints When EMS arrived patient was found to be drenched in her urine noted to be tachycardic oral temperature 99.1    Her partner states she has been urinating frequently lately , has been urinating on her self Has be constipated  She has been confused today even before today yesterday she has been confused   Family reports she has been having headaches, She has been taking BC almost every hour . She has frequent headaches at baseline but has been worse lately and her neck started to hurt her as well.  Decreased appetite Family denies any tick bites She works in the yard  She drinks about 2-6 beers a day no hx of DTs no hx of seizures She does cocaine last use may have been last week   ETOH intake  Does not smoke marijuana use     Regarding pertinent Chronic problems:    HTN on Norvasc      COPD -   not  on baseline oxygen      While in ER: Clinical Course as of 07/25/24 1903  Sun Jul 25, 2024  1702 2.6 K [LS]    Clinical Course User  Index [LS] Rogelia Jerilynn RAMAN, MD         Lab Orders         Resp panel by RT-PCR (RSV, Flu A&B, Covid) Anterior Nasal Swab         Blood Culture (routine x 2)         Respiratory (~20 pathogens) panel by PCR         Comprehensive metabolic panel         CBC with Differential         Protime-INR         Lipase, blood         Urinalysis, w/ Reflex to Culture (Infection Suspected) -Urine, Clean Catch         CK         Lactic acid, plasma         Magnesium          Phosphorus         TSH         Procalcitonin         Ammonia         Ethanol         Urine Drug Screen  Blood gas, venous         I-Stat Lactic Acid, ED      CT HEAD *** NON acute   MRI brain  ***no acute CVA  CXR -  NON acute  CTabd/pelvis - ***nonacute  CTA chest - ***nonacute, no PE, * no evidence of infiltrate  Following Medications were ordered in ER: Medications  lactated ringers  infusion ( Intravenous New Bag/Given 07/25/24 1659)  lactated ringers  bolus 1,000 mL (0 mLs Intravenous Stopped 07/25/24 1734)    And  lactated ringers  bolus 1,000 mL (1,000 mLs Intravenous New Bag/Given 07/25/24 1637)    And  lactated ringers  bolus 250 mL (0 mLs Intravenous Stopped 07/25/24 1734)  cefTRIAXone  (ROCEPHIN ) 2 g in sodium chloride  0.9 % 100 mL IVPB (0 g Intravenous Stopped 07/25/24 1735)  potassium chloride  10 mEq in 100 mL IVPB (10 mEq Intravenous New Bag/Given 07/25/24 1738)  potassium chloride  SA (KLOR-CON  M) CR tablet 40 mEq (40 mEq Oral Given 07/25/24 1732)    _______________________________________________________ ER Provider Called:       DrGOMEZ  They Recommend admit to medicine *** Will see in AM  ***SEEN in ER     ED Triage Vitals  Encounter Vitals Group     BP 07/25/24 1554 (!) 165/130     Girls Systolic BP Percentile --      Girls Diastolic BP Percentile --      Boys Systolic BP Percentile --      Boys Diastolic BP Percentile --      Pulse Rate 07/25/24 1554 (!) 103     Resp 07/25/24 1554  19     Temp 07/25/24 1554 99.9 F (37.7 C)     Temp Source 07/25/24 1554 Oral     SpO2 07/25/24 1554 100 %     Weight 07/25/24 1553 147 lb 11.3 oz (67 kg)     Height 07/25/24 1553 5' 6 (1.676 m)     Head Circumference --      Peak Flow --      Pain Score 07/25/24 1549 0     Pain Loc --      Pain Education --      Exclude from Growth Chart --   UFJK(75)@     _________________________________________ Significant initial  Findings: Abnormal Labs Reviewed  COMPREHENSIVE METABOLIC PANEL WITH GFR - Abnormal; Notable for the following components:      Result Value   Potassium 2.6 (*)    Glucose, Bld 139 (*)    Total Protein 8.2 (*)    Anion gap 17 (*)    All other components within normal limits  CBC WITH DIFFERENTIAL/PLATELET - Abnormal; Notable for the following components:   WBC 17.8 (*)    RBC 5.24 (*)    Hemoglobin 15.4 (*)    Neutro Abs 14.5 (*)    Monocytes Absolute 1.2 (*)    All other components within normal limits  PROTIME-INR - Abnormal; Notable for the following components:   Prothrombin Time 15.7 (*)    All other components within normal limits  URINALYSIS, W/ REFLEX TO CULTURE (INFECTION SUSPECTED) - Abnormal; Notable for the following components:   Color, Urine STRAW (*)    Hgb urine dipstick SMALL (*)    Protein, ur 30 (*)    Bacteria, UA RARE (*)    All other components within normal limits  CK - Abnormal; Notable for the following components:   Total CK 284 (*)    All other components within normal  limits  I-STAT CG4 LACTIC ACID, ED - Abnormal; Notable for the following components:   Lactic Acid, Venous 3.0 (*)    All other components within normal limits  I-STAT CG4 LACTIC ACID, ED - Abnormal; Notable for the following components:   Lactic Acid, Venous 4.2 (*)    All other components within normal limits  TROPONIN T, HIGH SENSITIVITY - Abnormal; Notable for the following components:   Troponin T High Sensitivity 23 (*)    All other components within  normal limits      _________________________ Troponin ***ordered Cardiac Panel (last 3 results) Recent Labs    07/25/24 1602  CKTOTAL 284*     ECG: Ordered Personally reviewed and interpreted by me showing: HR : *** Rhythm: *NSR, Sinus tachycardia * A.fib. W RVR, RBBB, LBBB, Paced Ischemic changes*nonspecific changes, no evidence of ischemic changes QTC* The recent clinical data is shown below. Vitals:   07/25/24 1553 07/25/24 1554 07/25/24 1630 07/25/24 1735  BP:  (!) 165/130 (!) 181/108 (!) 203/113  Pulse:  (!) 103 93 80  Resp:  19 (!) 21 18  Temp:  99.9 F (37.7 C)    TempSrc:  Oral    SpO2:  100% 99% 98%  Weight: 67 kg     Height: 5' 6 (1.676 m)      WBC     Component Value Date/Time   WBC 17.8 (H) 07/25/2024 1602   LYMPHSABS 2.0 07/25/2024 1602   MONOABS 1.2 (H) 07/25/2024 1602   EOSABS 0.0 07/25/2024 1602   BASOSABS 0.1 07/25/2024 1602        Lactic Acid, Venous    Component Value Date/Time   LATICACIDVEN 4.2 (HH) 07/25/2024 1849   Procalcitonin  Ordered      UA  no evidence of UTI Urine analysis:    Component Value Date/Time   COLORURINE STRAW (A) 07/25/2024 1602   APPEARANCEUR CLEAR 07/25/2024 1602   LABSPEC 1.006 07/25/2024 1602   PHURINE 7.0 07/25/2024 1602   GLUCOSEU NEGATIVE 07/25/2024 1602   HGBUR SMALL (A) 07/25/2024 1602   BILIRUBINUR NEGATIVE 07/25/2024 1602   KETONESUR NEGATIVE 07/25/2024 1602   PROTEINUR 30 (A) 07/25/2024 1602   NITRITE NEGATIVE 07/25/2024 1602   LEUKOCYTESUR NEGATIVE 07/25/2024 1602   ABX started Antibiotics Given (last 72 hours)     Date/Time Action Medication Dose Rate   07/25/24 1633 New Bag/Given   cefTRIAXone  (ROCEPHIN ) 2 g in sodium chloride  0.9 % 100 mL IVPB 2 g 200 mL/hr        No results found for the last 90 days.    ________________________________________________________________  Arterial ***Venous  Blood Gas result:  pH *** pCO2 ***; pO2 ***;     %O2 Sat ***.  ABG No results found  for: PHART, PCO2ART, PO2ART, HCO3, TCO2, ACIDBASEDEF, O2SAT     __________________________________________________________ Recent Labs  Lab 07/25/24 1602  NA 140  K 2.6*  CO2 25  GLUCOSE 139*  BUN 12  CREATININE 0.77  CALCIUM 9.9    Cr   stable,    Lab Results  Component Value Date   CREATININE 0.77 07/25/2024   CREATININE 0.85 11/24/2017   CREATININE 1.01 06/18/2010    Recent Labs  Lab 07/25/24 1602  AST 36  ALT 18  ALKPHOS 121  BILITOT 0.9  PROT 8.2*  ALBUMIN 4.3   Lab Results  Component Value Date   CALCIUM 9.9 07/25/2024        Plt: Lab Results  Component Value Date   PLT  292 07/25/2024       Recent Labs  Lab 07/25/24 1602  WBC 17.8*  NEUTROABS 14.5*  HGB 15.4*  HCT 44.9  MCV 85.7  PLT 292    HG/HCT  stable,       Component Value Date/Time   HGB 15.4 (H) 07/25/2024 1602   HCT 44.9 07/25/2024 1602   MCV 85.7 07/25/2024 1602     Recent Labs  Lab 07/25/24 1602  LIPASE 25   No results for input(s): AMMONIA in the last 168 hours.    _______________________________________________ Hospitalist was called for admission for  sirs   The following Work up has been ordered so far:  Orders Placed This Encounter  Procedures   Resp panel by RT-PCR (RSV, Flu A&B, Covid) Anterior Nasal Swab   Blood Culture (routine x 2)   Respiratory (~20 pathogens) panel by PCR   DG Chest Port 1 View   Comprehensive metabolic panel   CBC with Differential   Protime-INR   Lipase, blood   Urinalysis, w/ Reflex to Culture (Infection Suspected) -Urine, Clean Catch   CK   Lactic acid, plasma   Magnesium    Phosphorus   TSH   Procalcitonin   Ammonia   Ethanol   Urine Drug Screen   Blood gas, venous   Diet NPO time specified   Document height and weight   Assess and Document Glasgow Coma Scale   Document vital signs within 1-hour of fluid bolus completion. Notify provider of abnormal vital signs despite fluid resuscitation.   DO NOT  delay antibiotics if unable to obtain blood culture.   Refer to Sidebar Report: Sepsis Sidebar ED/IP   Notify provider for difficulties obtaining IV access.   Insert peripheral IV x 2   Initiate Carrier Fluid Protocol   Cardiac Monitoring - Continuous Indefinite   Code Sepsis activation.  This occurs automatically when order is signed and prioritizes pharmacy, lab, and radiology services for STAT collections and interventions.  If [facility] downtime, call [facility] [phone]) to activate Code Sepsis.   Consult to hospitalist   Droplet precaution   I-Stat Lactic Acid, ED   ED EKG   Place in observation (patient's expected length of stay will be less than 2 midnights)     OTHER Significant initial  Findings:  labs showing:     DM  labs:  HbA1C: No results for input(s): HGBA1C in the last 8760 hours.     CBG (last 3)  No results for input(s): GLUCAP in the last 72 hours.        Cultures:    Component Value Date/Time   SDES  07/25/2024 1602    BLOOD LEFT ARM Performed at [facility], [address], [place], [place] [address]    SPECREQUEST  07/25/2024 1602    BOTTLES DRAWN AEROBIC AND ANAEROBIC Blood Culture results may not be optimal due to an inadequate volume of blood received in culture bottles Performed at [facility], [address] [place], [place] [address]    CULT PENDING 07/25/2024 1602   REPTSTATUS PENDING 07/25/2024 1602     Radiological Exams on Admission: DG Chest Port 1 View Result Date: 07/25/2024 CLINICAL DATA:  Questionable sepsis - evaluate for abnormality Weakness.  Confusion. EXAM: PORTABLE CHEST 1 VIEW COMPARISON:  11/24/2017 FINDINGS: The cardiomediastinal contours are normal. The lungs are clear. Pulmonary vasculature is normal. No consolidation, pleural effusion, or pneumothorax. No acute osseous abnormalities are seen. Overlying clothing artifact, unable to be removed. IMPRESSION:  No active disease. Electronically Signed    By: Andrea Gasman M.D.   On: 07/25/2024 16:20   _______________________________________________________________________________________________________ Latest  Blood pressure (!) 203/113, pulse 80, temperature 99.9 F (37.7 C), temperature source Oral, resp. rate 18, height 5' 6 (1.676 m), weight 67 kg, SpO2 98%.   Vitals  labs and radiology finding personally reviewed  Review of Systems:    Pertinent positives include: ***  Constitutional:  No weight loss, night sweats, Fevers, chills, fatigue, weight loss  HEENT:  No headaches, Difficulty swallowing,Tooth/dental problems,Sore throat,  No sneezing, itching, ear ache, nasal congestion, post nasal drip,  Cardio-vascular:  No chest pain, Orthopnea, PND, anasarca, dizziness, palpitations.no Bilateral lower extremity swelling  GI:  No heartburn, indigestion, abdominal pain, nausea, vomiting, diarrhea, change in bowel habits, loss of appetite, melena, blood in stool, hematemesis Resp:  no shortness of breath at rest. No dyspnea on exertion, No excess mucus, no productive cough, No non-productive cough, No coughing up of blood.No change in color of mucus.No wheezing. Skin:  no rash or lesions. No jaundice GU:  no dysuria, change in color of urine, no urgency or frequency. No straining to urinate.  No flank pain.  Musculoskeletal:  No joint pain or no joint swelling. No decreased range of motion. No back pain.  Psych:  No change in mood or affect. No depression or anxiety. No memory loss.  Neuro: no localizing neurological complaints, no tingling, no weakness, no double vision, no gait abnormality, no slurred speech, no confusion  All systems reviewed and apart from HOPI all are negative _______________________________________________________________________________________________ Past Medical History:   Past Medical History:  Diagnosis Date   DOE (dyspnea on exertion)    Hypertension       Past Surgical History:   Procedure Laterality Date   CARPAL TUNNEL RELEASE      Social History:  Ambulatory *** independently cane, walker  wheelchair bound, bed bound     reports that she has never smoked. She has never used smokeless tobacco. She reports current alcohol use. She reports current drug use. Drug: Marijuana.     Family History: *** Family History  Problem Relation Age of Onset   Cancer Mother    Kidney failure Sister    Cancer Sister    ______________________________________________________________________________________________ Allergies: Allergies  Allergen Reactions   Codeine     REACTION: mental status change     Prior to Admission medications   Medication Sig Start Date End Date Taking? Authorizing Provider  amLODipine  (NORVASC ) 5 MG tablet Take 5 mg by mouth daily.    [provider]  aspirin EC 81 MG tablet Take 81 mg by mouth daily. Swallow whole.    [provider]  gabapentin (NEURONTIN) 400 MG capsule Take 1 capsule by mouth at bedtime.    [provider]  ibuprofen  (ADVIL ) 200 MG tablet Take 200 mg by mouth every 6 (six) hours as needed.    [provider]  Multiple Vitamins-Minerals (CENTRUM ADULTS PO) Take by mouth.    [provider]  omeprazole (PRILOSEC) 40 MG capsule Take 1 tablet by mouth daily.    [provider]  Simethicone  180 MG CAPS Take by mouth.    [provider]  valACYclovir (VALTREX) 1000 MG tablet Take 1,000 mg by mouth 2 (two) times daily. 09/14/20   [provider]    ___________________________________________________________________________________________________ Physical Exam:    07/25/2024    5:35 PM 07/25/2024    4:30 PM 07/25/2024    3:54 PM  Vitals with BMI  Systolic 203 181 834  Diastolic 113 108 869  Pulse 80 93 103     1. General:  in No ***Acute distress***increased work of breathing ***complaining of severe pain****agitated * Chronically ill *well  *cachectic *toxic acutely ill -appearing 2. Psychological: Alert and *** Oriented 3. Head/ENT:   Moist *** Dry Mucous Membranes                          Head Non traumatic, neck supple                          Normal *** Poor Dentition 4. SKIN: normal *** decreased Skin turgor,  Skin clean Dry and intact no rash    5. Heart: Regular rate and rhythm no*** Murmur, no Rub or gallop 6. Lungs: ***Clear to auscultation bilaterally, no wheezes or crackles   7. Abdomen: Soft, ***non-tender, Non distended *** obese ***bowel sounds present 8. Lower extremities: no clubbing, cyanosis, no ***edema 9. Neurologically Grossly intact, moving all 4 extremities equally *** strength 5 out of 5 in all 4 extremities cranial nerves II through XII intact 10. MSK: Normal range of motion    Chart has been reviewed  ______________________________________________________________________________________________  Assessment/Plan  ***  Admitted for *** There are no diagnoses linked to this encounter.   Present on Admission:  SIRS (systemic inflammatory response syndrome) (HCC)  HYPOKALEMIA, MILD     HYPOKALEMIA, MILD - will replace electrolytes and repeat  check Mg, phos and Ca level and replace as needed Monitor on telemetry   Lab Results  Component Value Date   K 2.6 (LL) 07/25/2024     Lab Results  Component Value Date   CREATININE 0.77 07/25/2024   No results found for: MG Lab Results  Component Value Date   CALCIUM 9.9 07/25/2024      Other plan as per orders.  DVT prophylaxis:  SCD *** Lovenox        Code Status:    Code Status: Not on file FULL CODE *** DNR/DNI ***comfort care as per patient ***family  I had personally discussed CODE STATUS with patient and family*  ACP *** none has been reviewed ***   Family Communication:   Family not at  Bedside  plan of care was discussed on the phone with *** Son, Daughter, Wife, Husband, Sister, Brother , father, mother  Diet  Diet  Orders (From admission, onward)     Start     Ordered   07/25/24 1557  Diet NPO time specified  (Septic presentation on arrival (screening labs, nursing and treatment orders for obvious sepsis))  Diet effective now        07/25/24 1557            Disposition Plan:        To home once workup is complete and patient is stable  ***Following barriers for discharge:                             Chest pain *** Stroke *** Syncope ***work up is complete                            Electrolytes corrected  Anemia corrected h/H stable                             Pain controlled with PO medications                               Afebrile, white count improving able to transition to PO antibiotics                             Will need to be able to tolerate PO                            Will likely need home health, home O2, set up                           Will need consultants to evaluate patient prior to discharge                           Work of breathing improves       Consult Orders  (From admission, onward)           Start     Ordered   07/25/24 1818  Consult to hospitalist  Once       Provider:  (Not yet assigned)  Question Answer Comment  Place call to: [facility]   Reason for Consult Admit      07/25/24 1817                              ***Would benefit from PT/OT eval prior to DC  Ordered                   Swallow eval - SLP ordered                   Diabetes care coordinator                   Transition of care consulted                   Nutrition    consulted                  Wound care  consulted                   Palliative care    consulted                   Behavioral health  consulted                    Consults called: ***  NONE   Admission status:  ED Disposition     ED Disposition  Admit   Condition  --   Comment  Hospital Area: [facility] [[id number]]  Level of Care: Stepdown [14]  Admit  to SDU based on following criteria: Hemodynamic compromise or significant risk of instability:  Patient requiring short term acute titration and management of vasoactive drips, and invasive monitoring (i.e., CVP and Arterial line).  May place patient in observation at [facility] or [facility] if equivalent level of care is available:: No  Covid  Evaluation: Asymptomatic - no recent exposure (last 10 days) testing not required  Diagnosis: SIRS (systemic inflammatory response syndrome) (HCC) [617002]  Admitting Physician: Keyen Marban [[id number]]  Attending Physician: Zyrus Hetland [[id number]]  For patients discharging to extended facilities (i.e. SNF, AL, group homes or LTAC) initiate:: Discharge to SNF/Facility Placement COVID-19 Lab Testing Protocol           Obs***  ***  inpatient     I Expect 2 midnight stay secondary to severity of patient's current illness need for inpatient interventions justified by the following: ***hemodynamic instability despite optimal treatment (tachycardia *hypotension * tachypnea *hypoxia, hypercapnia) *** Severe lab/radiological/exam abnormalities including:    There are no diagnoses linked to this encounter. and extensive comorbidities including: *substance abuse  *Chronic pain *DM2  * CHF * CAD  * COPD/asthma *Morbid Obesity * CKD *dementia *liver disease *history of stroke with residual deficits *  malignancy, * sickle cell disease  History of amputation Chronic anticoagulation  That are currently affecting medical management.   I expect  patient to be hospitalized for 2 midnights requiring inpatient medical care.  Patient is at high risk for adverse outcome (such as loss of life or disability) if not treated.  Indication for inpatient stay as follows:  Severe change from baseline regarding mental status Hemodynamic instability despite maximal medical therapy,  severe pain requiring acute inpatient management,  inability to  maintain oral hydration   persistent chest pain despite medical management Need for operative/procedural  intervention New or worsening hypoxia ongoing suicidal ideations   Need for IV antibiotics, IV fluids,    Level of care   stepdown   tele indefinitely please discontinue once patient no longer qualifies COVID-19 Labs       Anmarie Fukushima 07/25/2024, 7:03 PM ***  [facility]     after 2 AM please page floor coverage   If 7AM-7PM, please contact the day team taking care of the patient using [email or web address]

## 2024-07-25 NOTE — ED Triage Notes (Signed)
 Patient BIB [facility] from home. Per son, has been sick for the past week. Increased weakness. Today noticed more confusion. Disoriented to time per EMS. GCS 14. EMS 200mL fluid IV.  EMS 20G left AC

## 2024-07-25 NOTE — H&P (Signed)
 NAME:  Daisy [name], MRN:  [id number], DOB:  06/21/1956, LOS: 0 ADMISSION DATE:  07/25/2024, CONSULTATION DATE:  07/25/2024 REFERRING MD:  Dorn Glade, MD, CHIEF COMPLAINT:  Sepsis/SAH   History of Present Illness:  68 y/o female who has a PMH for COPD not on home O2, Essential HTN, DMT2, Vitamin D deficiency, GERD, chronic back pain, ETOH abuse, Polysubstance abuse who presented to [facility] for confusion and not feel well for the last 1 week.  Son called 911.  EMS found patient on the ground and drenched in her urine with tachycardia and temp 99.1. Per notes she was more confused today and has been urinating frequently and on herself.  Per records she has been having headaches.  She drinks 2-6 beers daily and does cocaine-last done 1 week ago. In the ED her K 2.6, AG 17, LA 3--->5.9, WBC 17.8.  She was treated for sepsis and started on antibiotics.  Due to confusion, she had a CT head which revealed a SAH.  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  CT chest/abd/pelvis did not reveal any acute issues.  Pertinent  Medical History  COPD not on home O2, Essential HTN, DMT2, Vitamin D deficiency, GERD, chronic back pain, ETOH abuse, Polysubstance abuse   Significant Hospital Events: Including procedures, antibiotic start and stop dates in addition to other pertinent events   8/31: transferred to [facility] ICU from [facility]  Interim History / Subjective:  N/a  Objective    Blood pressure 131/80, pulse 73, temperature 98 F (36.7 C), temperature source Axillary, resp. rate 19, height 5' 6 (1.676 m), weight 67 kg, SpO2 95%.        Intake/Output Summary (Last 24 hours) at 07/25/2024 2352 Last data filed at 07/25/2024 1920 Gross per 24 hour  Intake 2449.11 ml  Output --  Net 2449.11 ml   Filed Weights   07/25/24 1553  Weight: 67 kg    Examination: General: Awake alert NAD HENT: PERRLA no  icterus EOMI, supple, no LN no JVD Lungs: CTA no wheezes rales or rhonchi Cardiovascular: reg s1s2 no murmurs gallops or rubs Abdomen: soft nt nd bs pos no guarding Extremities: no cyanosis, clubbing or edema Neuro: Alert and awake, Oriented x 3, NAD, CN II to XII grossly intact, strength 5/5, sensation intact, finger to nose and heal to shin wnl, some intermittent mild confusion GU: n/a  Resolved problem list   Assessment and Plan  Sepsis Not sure the source, resp panel negative, blood cultures have been sent She has received Ceftriaxone  and Vancomycin , continue for now Lactic acidosis Continue to trend, not clear the source IV fluids Hypokalemia Repeat CMP Replace as needed SAH BP management Currently on Claviprex to keep SBP<140 Saccular Aneurysm of  Anterior Communicating Artery  For possible  surgery in am Keep NPO HTN Claviprex for now Po home meds when appropriate DMT2 FS and SSI COPD Albuterol  nebs prn No signs of exacerbation ETOH use Thiamine  and FA Monitor for withdrawal Polysubstance use Supportive care Chronic back pain Prn pain meds   Labs   CBC: Recent Labs  Lab 07/25/24 1602  WBC 17.8*  NEUTROABS 14.5*  HGB 15.4*  HCT 44.9  MCV 85.7  PLT 292    Basic Metabolic Panel: Recent Labs  Lab 07/25/24 1602  NA 140  K 2.6*  CL 98  CO2 25  GLUCOSE 139*  BUN 12  CREATININE  0.77  CALCIUM 9.9   GFR: Estimated Creatinine Clearance: 64.8 mL/min (by C-G formula based on SCr of 0.77 mg/dL). Recent Labs  Lab 07/25/24 1602 07/25/24 1635 07/25/24 1849 07/25/24 2054 07/25/24 2137  WBC 17.8*  --   --   --   --   LATICACIDVEN  --  3.0* 4.2* 2.7* 5.9*    Liver Function Tests: Recent Labs  Lab 07/25/24 1602  AST 36  ALT 18  ALKPHOS 121  BILITOT 0.9  PROT 8.2*  ALBUMIN 4.3   Recent Labs  Lab 07/25/24 1602  LIPASE 25   Recent Labs  Lab 07/25/24 2137  AMMONIA 31    ABG    Component Value Date/Time   HCO3 26.7 07/25/2024  2137   O2SAT 86.6 07/25/2024 2137     Coagulation Profile: Recent Labs  Lab 07/25/24 1602  INR 1.2    Cardiac Enzymes: Recent Labs  Lab 07/25/24 1602  CKTOTAL 284*    HbA1C: No results found for: HGBA1C  CBG: No results for input(s): GLUCAP in the last 168 hours.  Review of Systems:   Currently no c/o HA, BV, N/V/D, abd pain, chest pain,   Past Medical History:  She,  has a past medical history of DOE (dyspnea on exertion) and Hypertension.   Surgical History:   Past Surgical History:  Procedure Laterality Date   CARPAL TUNNEL RELEASE       Social History:   reports that she has never smoked. She has never used smokeless tobacco. She reports current alcohol use. She reports current drug use. Drug: Marijuana.   Family History:  Her family history includes Cancer in her mother and sister; Kidney failure in her sister.   Allergies Allergies  Allergen Reactions   Codeine     REACTION: mental status change     Home Medications  Prior to Admission medications   Medication Sig Start Date End Date Taking? Authorizing Provider  amLODipine  (NORVASC ) 5 MG tablet Take 5 mg by mouth daily.    [provider]  aspirin EC 81 MG tablet Take 81 mg by mouth daily. Swallow whole.    [provider]  gabapentin (NEURONTIN) 400 MG capsule Take 1 capsule by mouth at bedtime.    [provider]  ibuprofen  (ADVIL ) 200 MG tablet Take 200 mg by mouth every 6 (six) hours as needed.    [provider]  Multiple Vitamins-Minerals (CENTRUM ADULTS PO) Take by mouth.    [provider]  omeprazole (PRILOSEC) 40 MG capsule Take 1 tablet by mouth daily.    [provider]  Simethicone  180 MG CAPS Take by mouth.    [provider]  valACYclovir (VALTREX) 1000 MG tablet Take 1,000 mg by mouth 2 (two) times daily. 09/14/20   [provider]     Critical care time: 51   The patient is critically ill with multiple  organ system failure and requires high complexity decision making for assessment and support, frequent evaluation and titration of therapies, advanced monitoring, review of radiographic studies and interpretation of complex data.   Critical Care Time devoted to patient care services, exclusive of separately billable procedures, described in this note is 34 minutes.   Orlin Fairly, MD [facility] See Amion for pager  If no response to pager , please call [phone] until 7pm After 7:00 pm call Elink  [phone] 07/25/2024, 11:52 PM

## 2024-07-26 ENCOUNTER — Inpatient Hospital Stay (HOSPITAL_COMMUNITY)

## 2024-07-26 ENCOUNTER — Inpatient Hospital Stay (HOSPITAL_COMMUNITY): Admitting: Anesthesiology

## 2024-07-26 ENCOUNTER — Encounter (HOSPITAL_COMMUNITY): Payer: Self-pay

## 2024-07-26 ENCOUNTER — Other Ambulatory Visit (HOSPITAL_COMMUNITY)

## 2024-07-26 ENCOUNTER — Encounter (HOSPITAL_COMMUNITY): Admission: EM | Disposition: E | Payer: Self-pay | Source: Home / Self Care | Attending: Pulmonary Disease

## 2024-07-26 DIAGNOSIS — I602 Nontraumatic subarachnoid hemorrhage from anterior communicating artery: Principal | ICD-10-CM

## 2024-07-26 DIAGNOSIS — I739 Peripheral vascular disease, unspecified: Secondary | ICD-10-CM

## 2024-07-26 DIAGNOSIS — R6510 Systemic inflammatory response syndrome (SIRS) of non-infectious origin without acute organ dysfunction: Secondary | ICD-10-CM

## 2024-07-26 DIAGNOSIS — E878 Other disorders of electrolyte and fluid balance, not elsewhere classified: Secondary | ICD-10-CM

## 2024-07-26 DIAGNOSIS — I720 Aneurysm of carotid artery: Secondary | ICD-10-CM

## 2024-07-26 DIAGNOSIS — I609 Nontraumatic subarachnoid hemorrhage, unspecified: Secondary | ICD-10-CM

## 2024-07-26 DIAGNOSIS — Z8659 Personal history of other mental and behavioral disorders: Secondary | ICD-10-CM

## 2024-07-26 DIAGNOSIS — I10 Essential (primary) hypertension: Secondary | ICD-10-CM | POA: Diagnosis not present

## 2024-07-26 HISTORY — PX: IR TRANSCATH/EMBOLIZ: IMG695

## 2024-07-26 HISTORY — PX: IR ANGIO INTRA EXTRACRAN SEL INTERNAL CAROTID BILAT MOD SED: IMG5363

## 2024-07-26 HISTORY — PX: IR ANGIO VERTEBRAL SEL VERTEBRAL UNI L MOD SED: IMG5367

## 2024-07-26 HISTORY — PX: IR ANGIOGRAM FOLLOW UP STUDY: IMG697

## 2024-07-26 HISTORY — PX: RADIOLOGY WITH ANESTHESIA: SHX6223

## 2024-07-26 LAB — PROCALCITONIN: Procalcitonin: <0.1 ng/mL

## 2024-07-26 LAB — MAGNESIUM
Magnesium: 1.5 mg/dL — ABNORMAL LOW (ref 1.7–2.4)
Magnesium: 3.5 mg/dL — ABNORMAL HIGH (ref 1.7–2.4)

## 2024-07-26 LAB — COMPREHENSIVE METABOLIC PANEL WITH GFR
ALT: 32 U/L (ref 0–44)
AST: 66 U/L — ABNORMAL HIGH (ref 15–41)
Albumin: 3.5 g/dL (ref 3.5–5.0)
Alkaline Phosphatase: 90 U/L (ref 38–126)
Anion gap: 13 (ref 5–15)
BUN: 10 mg/dL (ref 8–23)
CO2: 28 mmol/L (ref 22–32)
Calcium: 9.1 mg/dL (ref 8.9–10.3)
Chloride: 100 mmol/L (ref 98–111)
Creatinine, Ser: 0.77 mg/dL (ref 0.44–1.00)
GFR, Estimated: >60 mL/min (ref >60)
Glucose, Bld: 114 mg/dL — ABNORMAL HIGH (ref 70–99)
Potassium: 3.9 mmol/L (ref 3.5–5.1)
Sodium: 141 mmol/L (ref 135–145)
Total Bilirubin: 0.9 mg/dL (ref 0.0–1.2)
Total Protein: 7.3 g/dL (ref 6.5–8.1)

## 2024-07-26 LAB — CBC
HCT: 41.5 % (ref 36.0–46.0)
Hemoglobin: 14.3 g/dL (ref 12.0–15.0)
MCH: 29.4 pg (ref 26.0–34.0)
MCHC: 34.5 g/dL (ref 30.0–36.0)
MCV: 85.2 fL (ref 80.0–100.0)
Platelets: 247 K/uL (ref 150–400)
RBC: 4.87 MIL/uL (ref 3.87–5.11)
RDW: 13.7 % (ref 11.5–15.5)
WBC: 17.3 K/uL — ABNORMAL HIGH (ref 4.0–10.5)
nRBC: 0 % (ref 0.0–0.2)

## 2024-07-26 LAB — HIV ANTIBODY (ROUTINE TESTING W REFLEX)
HIV Screen 4th Generation wRfx: NONREACTIVE
HIV Screen 4th Generation wRfx: NONREACTIVE

## 2024-07-26 LAB — GLUCOSE, CAPILLARY
Glucose-Capillary: 100 mg/dL — ABNORMAL HIGH (ref 70–99)
Glucose-Capillary: 116 mg/dL — ABNORMAL HIGH (ref 70–99)
Glucose-Capillary: 120 mg/dL — ABNORMAL HIGH (ref 70–99)
Glucose-Capillary: 121 mg/dL — ABNORMAL HIGH (ref 70–99)
Glucose-Capillary: 125 mg/dL — ABNORMAL HIGH (ref 70–99)
Glucose-Capillary: 95 mg/dL (ref 70–99)

## 2024-07-26 LAB — APTT: aPTT: 29 s (ref 24–36)

## 2024-07-26 LAB — MRSA NEXT GEN BY PCR, NASAL: MRSA by PCR Next Gen: NOT DETECTED

## 2024-07-26 LAB — PROTIME-INR
INR: 1.2 (ref 0.8–1.2)
Prothrombin Time: 16.2 s — ABNORMAL HIGH (ref 11.4–15.2)

## 2024-07-26 LAB — TYPE AND SCREEN
ABO/RH(D): O POS
Antibody Screen: NEGATIVE

## 2024-07-26 LAB — HEMOGLOBIN A1C
Hgb A1c MFr Bld: 6.3 % — ABNORMAL HIGH (ref 4.8–5.6)
Mean Plasma Glucose: 134.11 mg/dL

## 2024-07-26 LAB — LACTIC ACID, PLASMA
Lactic Acid, Venous: 4.1 mmol/L (ref 0.5–1.9)
Lactic Acid, Venous: 1.4 mmol/L (ref 0.5–1.9)
Lactic Acid, Venous: 0.9 mmol/L (ref 0.5–1.9)

## 2024-07-26 LAB — PHOSPHORUS
Phosphorus: 2.1 mg/dL — ABNORMAL LOW (ref 2.5–4.6)
Phosphorus: 3.6 mg/dL (ref 2.5–4.6)

## 2024-07-26 SURGERY — RADIOLOGY WITH ANESTHESIA
Anesthesia: General

## 2024-07-26 MED ORDER — ONDANSETRON HCL 4 MG/2ML IJ SOLN
INTRAMUSCULAR | Status: DC | PRN
  Administered 2024-07-26: 4 mg via INTRAVENOUS

## 2024-07-26 MED ORDER — FENTANYL CITRATE PF 50 MCG/ML IJ SOSY
50.0000 ug | PREFILLED_SYRINGE | Freq: Once | INTRAMUSCULAR | Status: AC
  Administered 2024-07-26: 50 ug via INTRAVENOUS

## 2024-07-26 MED ORDER — MAGNESIUM SULFATE 50 % IJ SOLN
6.0000 g | Freq: Once | INTRAVENOUS | Status: AC
  Administered 2024-07-26: 6 g via INTRAVENOUS
  Filled 2024-07-26: qty 10

## 2024-07-26 MED ORDER — ORAL CARE MOUTH RINSE
15.0000 mL | OROMUCOSAL | Status: DC | PRN
Start: 2024-07-26 — End: 2024-07-26

## 2024-07-26 MED ORDER — PANTOPRAZOLE SODIUM 40 MG IV SOLR
40.0000 mg | Freq: Every day | INTRAVENOUS | Status: DC
  Administered 2024-07-26 – 2024-08-13 (×20): 40 mg via INTRAVENOUS
  Filled 2024-07-26 (×20): qty 10

## 2024-07-26 MED ORDER — LEVETIRACETAM (KEPPRA) 500 MG/5 ML ADULT IV PUSH
500.0000 mg | Freq: Two times a day (BID) | INTRAVENOUS | Status: DC
  Administered 2024-07-26 – 2024-07-27 (×3): 500 mg via INTRAVENOUS
  Filled 2024-07-26 (×3): qty 5

## 2024-07-26 MED ORDER — PHENYLEPHRINE HCL-NACL 20-0.9 MG/250ML-% IV SOLN
INTRAVENOUS | Status: DC | PRN
  Administered 2024-07-26: 25 ug/min via INTRAVENOUS

## 2024-07-26 MED ORDER — ROCURONIUM BROMIDE 10 MG/ML (PF) SYRINGE
PREFILLED_SYRINGE | INTRAVENOUS | Status: DC | PRN
  Administered 2024-07-26: 10 mg via INTRAVENOUS
  Administered 2024-07-26: 50 mg via INTRAVENOUS

## 2024-07-26 MED ORDER — NIMODIPINE 30 MG PO CAPS
60.0000 mg | ORAL_CAPSULE | ORAL | Status: DC
  Administered 2024-07-26: 60 mg via ORAL
  Filled 2024-07-26: qty 2

## 2024-07-26 MED ORDER — LIDOCAINE-EPINEPHRINE 1 %-1:100000 IJ SOLN
10.0000 mL | Freq: Once | INTRAMUSCULAR | Status: AC
  Administered 2024-07-26: 10 mL via INTRADERMAL
  Filled 2024-07-26: qty 1

## 2024-07-26 MED ORDER — DEXAMETHASONE SODIUM PHOSPHATE 10 MG/ML IJ SOLN
INTRAMUSCULAR | Status: DC | PRN
  Administered 2024-07-26: 5 mg via INTRAVENOUS

## 2024-07-26 MED ORDER — PROPOFOL 10 MG/ML IV BOLUS
INTRAVENOUS | Status: DC | PRN
  Administered 2024-07-26: 100 mg via INTRAVENOUS

## 2024-07-26 MED ORDER — LIDOCAINE 2% (20 MG/ML) 5 ML SYRINGE
INTRAMUSCULAR | Status: DC | PRN
  Administered 2024-07-26: 60 mg via INTRAVENOUS

## 2024-07-26 MED ORDER — CEFAZOLIN SODIUM-DEXTROSE 2-4 GM/100ML-% IV SOLN
2.0000 g | Freq: Once | INTRAVENOUS | Status: AC
  Administered 2024-07-26: 2 g via INTRAVENOUS
  Filled 2024-07-26: qty 100

## 2024-07-26 MED ORDER — MORPHINE SULFATE (PF) 2 MG/ML IV SOLN
2.0000 mg | INTRAVENOUS | Status: DC | PRN
  Administered 2024-07-26 – 2024-08-06 (×6): 2 mg via INTRAVENOUS
  Filled 2024-07-26 (×9): qty 1

## 2024-07-26 MED ORDER — FENTANYL CITRATE PF 50 MCG/ML IJ SOSY
PREFILLED_SYRINGE | INTRAMUSCULAR | Status: AC
  Filled 2024-07-26: qty 4

## 2024-07-26 MED ORDER — FENTANYL CITRATE (PF) 100 MCG/2ML IJ SOLN
INTRAMUSCULAR | Status: AC
Start: 2024-07-26 — End: 2024-07-26
  Filled 2024-07-26: qty 2

## 2024-07-26 MED ORDER — POTASSIUM CHLORIDE 10 MEQ/100ML IV SOLN
10.0000 meq | INTRAVENOUS | Status: AC
  Administered 2024-07-26 (×6): 10 meq via INTRAVENOUS
  Filled 2024-07-26 (×6): qty 100

## 2024-07-26 MED ORDER — FOLIC ACID 5 MG/ML IJ SOLN
1.0000 mg | Freq: Every day | INTRAMUSCULAR | Status: DC
  Administered 2024-07-26 – 2024-07-27 (×2): 1 mg via INTRAVENOUS
  Filled 2024-07-26 (×2): qty 0.2

## 2024-07-26 MED ORDER — SODIUM CHLORIDE 0.9 % IV SOLN: INTRAVENOUS | Status: AC

## 2024-07-26 MED ORDER — MIDAZOLAM HCL 2 MG/2ML IJ SOLN
2.0000 mg | Freq: Once | INTRAMUSCULAR | Status: AC
  Administered 2024-07-26: 2 mg via INTRAVENOUS

## 2024-07-26 MED ORDER — SUGAMMADEX SODIUM 200 MG/2ML IV SOLN
INTRAVENOUS | Status: DC | PRN
  Administered 2024-07-26: 134 mg via INTRAVENOUS

## 2024-07-26 MED ORDER — ORAL CARE MOUTH RINSE
15.0000 mL | OROMUCOSAL | Status: DC
  Administered 2024-07-26 – 2024-07-27 (×4): 15 mL via OROMUCOSAL

## 2024-07-26 MED ORDER — CLEVIDIPINE BUTYRATE 0.5 MG/ML IV EMUL
INTRAVENOUS | Status: AC
  Filled 2024-07-26: qty 50

## 2024-07-26 MED ORDER — MIDAZOLAM HCL 2 MG/2ML IJ SOLN
INTRAMUSCULAR | Status: AC
  Filled 2024-07-26: qty 4

## 2024-07-26 MED ORDER — FENTANYL CITRATE (PF) 250 MCG/5ML IJ SOLN
INTRAMUSCULAR | Status: DC | PRN
  Administered 2024-07-26 (×2): 50 ug via INTRAVENOUS

## 2024-07-26 MED ORDER — IOHEXOL 300 MG/ML  SOLN
150.0000 mL | Freq: Once | INTRAMUSCULAR | Status: AC | PRN
  Administered 2024-07-26: 75 mL via INTRA_ARTERIAL

## 2024-07-26 MED ORDER — STROKE: EARLY STAGES OF RECOVERY BOOK
Freq: Once | Status: AC
  Filled 2024-07-26: qty 1

## 2024-07-26 MED ORDER — NIMODIPINE 6 MG/ML PO SOLN
60.0000 mg | ORAL | Status: DC
  Administered 2024-07-26 – 2024-07-29 (×17): 60 mg
  Filled 2024-07-26 (×17): qty 10

## 2024-07-26 MED ORDER — SODIUM CHLORIDE 0.9 % IV SOLN
1.0000 g | INTRAVENOUS | Status: DC
  Administered 2024-07-26 – 2024-07-28 (×3): 1 g via INTRAVENOUS
  Filled 2024-07-26 (×3): qty 10

## 2024-07-26 MED ORDER — PHENYLEPHRINE 80 MCG/ML (10ML) SYRINGE FOR IV PUSH (FOR BLOOD PRESSURE SUPPORT)
PREFILLED_SYRINGE | INTRAVENOUS | Status: DC | PRN
  Administered 2024-07-26 (×5): 80 ug via INTRAVENOUS

## 2024-07-26 MED ORDER — LACTATED RINGERS IV SOLN: INTRAVENOUS | Status: DC | PRN

## 2024-07-26 MED ORDER — THIAMINE HCL 100 MG/ML IJ SOLN
100.0000 mg | Freq: Every day | INTRAMUSCULAR | Status: DC
  Administered 2024-07-26 – 2024-07-27 (×2): 100 mg via INTRAVENOUS
  Filled 2024-07-26 (×2): qty 2

## 2024-07-26 MED ORDER — INSULIN ASPART 100 UNIT/ML IJ SOLN
0.0000 [IU] | INTRAMUSCULAR | Status: DC
  Administered 2024-07-26 – 2024-07-28 (×3): 1 [IU] via SUBCUTANEOUS
  Administered 2024-07-28 – 2024-07-29 (×3): 2 [IU] via SUBCUTANEOUS
  Administered 2024-07-29: 1 [IU] via SUBCUTANEOUS
  Administered 2024-07-29: 2 [IU] via SUBCUTANEOUS
  Administered 2024-07-29: 1 [IU] via SUBCUTANEOUS
  Administered 2024-07-30: 2 [IU] via SUBCUTANEOUS
  Administered 2024-07-30: 5 [IU] via SUBCUTANEOUS
  Administered 2024-07-30: 2 [IU] via SUBCUTANEOUS
  Administered 2024-07-30: 1 [IU] via SUBCUTANEOUS
  Administered 2024-07-31: 2 [IU] via SUBCUTANEOUS
  Administered 2024-07-31: 3 [IU] via SUBCUTANEOUS
  Administered 2024-07-31: 1 [IU] via SUBCUTANEOUS
  Administered 2024-07-31 (×2): 2 [IU] via SUBCUTANEOUS
  Administered 2024-07-31: 1 [IU] via SUBCUTANEOUS
  Administered 2024-08-01 (×2): 2 [IU] via SUBCUTANEOUS
  Administered 2024-08-01: 1 [IU] via SUBCUTANEOUS
  Administered 2024-08-02: 2 [IU] via SUBCUTANEOUS
  Administered 2024-08-02: 1 [IU] via SUBCUTANEOUS
  Administered 2024-08-02 (×3): 2 [IU] via SUBCUTANEOUS
  Administered 2024-08-02 – 2024-08-03 (×2): 1 [IU] via SUBCUTANEOUS
  Administered 2024-08-03 (×3): 2 [IU] via SUBCUTANEOUS
  Administered 2024-08-03: 1 [IU] via SUBCUTANEOUS
  Administered 2024-08-03: 4 [IU] via SUBCUTANEOUS
  Administered 2024-08-04 (×2): 1 [IU] via SUBCUTANEOUS
  Administered 2024-08-04 (×2): 2 [IU] via SUBCUTANEOUS
  Administered 2024-08-04 – 2024-08-05 (×3): 1 [IU] via SUBCUTANEOUS
  Administered 2024-08-05 (×4): 2 [IU] via SUBCUTANEOUS
  Administered 2024-08-06: 1 [IU] via SUBCUTANEOUS
  Administered 2024-08-06: 2 [IU] via SUBCUTANEOUS
  Administered 2024-08-06: 1 [IU] via SUBCUTANEOUS
  Administered 2024-08-06: 2 [IU] via SUBCUTANEOUS
  Administered 2024-08-06: 1 [IU] via SUBCUTANEOUS
  Administered 2024-08-07 (×3): 2 [IU] via SUBCUTANEOUS
  Administered 2024-08-07: 1 [IU] via SUBCUTANEOUS
  Administered 2024-08-08 (×7): 2 [IU] via SUBCUTANEOUS
  Administered 2024-08-09: 1 [IU] via SUBCUTANEOUS
  Administered 2024-08-09: 2 [IU] via SUBCUTANEOUS
  Administered 2024-08-09: 1 [IU] via SUBCUTANEOUS
  Administered 2024-08-09: 2 [IU] via SUBCUTANEOUS
  Administered 2024-08-10 (×3): 1 [IU] via SUBCUTANEOUS
  Administered 2024-08-10: 2 [IU] via SUBCUTANEOUS
  Administered 2024-08-11: 1 [IU] via SUBCUTANEOUS
  Administered 2024-08-11: 2 [IU] via SUBCUTANEOUS
  Administered 2024-08-11 (×4): 1 [IU] via SUBCUTANEOUS
  Administered 2024-08-12 (×2): 2 [IU] via SUBCUTANEOUS
  Administered 2024-08-12 – 2024-08-13 (×3): 1 [IU] via SUBCUTANEOUS
  Administered 2024-08-13 (×3): 2 [IU] via SUBCUTANEOUS
  Administered 2024-08-13: 3 [IU] via SUBCUTANEOUS
  Administered 2024-08-13: 2 [IU] via SUBCUTANEOUS

## 2024-07-26 NOTE — Progress Notes (Signed)
 Patient received back from IR at 1840. Patient on 4NICU monitor but vitals not flowing over to [facility] until 1902. Blood pressure was within parameters (SBP < 160) by art line during this time.

## 2024-07-26 NOTE — H&P (Addendum)
 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 Lanis to address aneurysm treatment Place right EVD now SBP<140 Keep NPO Bedrest HOB as tolerated Hold anticoagulation and antiplatelets     CC: found down  HPI:     Patient is a 68 y.o. female w/ hx polysubstance abuse, DM2 who presented with aneurysmal subarachnoid hemorrhage. EMS found the patient unresponsive. Reportedly has not felt well for a week   Patient Active Problem List   Diagnosis Date Noted   Subarachnoid hemorrhage (HCC) 07/26/2024   SIRS (systemic inflammatory response syndrome) (HCC) 07/25/2024   SAH (subarachnoid hemorrhage) (HCC) 07/25/2024   Dyspnea on exertion 09/21/2020   Screening for cholesterol level 09/21/2020   Corneal abrasion 07/23/2011   Elbow pain, left 06/30/2011   Sore throat 02/12/2011   HYPOKALEMIA, MILD 11/04/2008   CARPAL TUNNEL SYNDROME 04/30/2007   Primary hypertension 01/22/2007   RHINITIS, ALLERGIC 01/22/2007   MENOPAUSAL SYNDROME 01/22/2007   Past Medical History:  Diagnosis Date   DOE (dyspnea on exertion)    Hypertension     Past Surgical History:  Procedure Laterality Date   CARPAL TUNNEL RELEASE      Medications Prior to Admission  Medication Sig Dispense Refill Last Dose/Taking   amLODipine  (NORVASC ) 5 MG tablet Take 5 mg by mouth daily.      aspirin EC 81 MG tablet Take 81 mg by mouth daily. Swallow whole.      gabapentin (NEURONTIN) 400 MG capsule Take 1 capsule by mouth at bedtime.      ibuprofen  (ADVIL ) 200 MG tablet Take 200 mg by mouth every 6 (six) hours as needed.      Multiple Vitamins-Minerals (CENTRUM ADULTS PO) Take by mouth.      omeprazole (PRILOSEC) 40 MG capsule Take 1 tablet by mouth daily.      Simethicone  180 MG CAPS Take by mouth.      valACYclovir (VALTREX) 1000 MG tablet Take 1,000 mg by mouth 2 (two) times  daily.      Allergies  Allergen Reactions   Codeine     REACTION: mental status change    Social History   Tobacco Use   Smoking status: Never   Smokeless tobacco: Never  Substance Use Topics   Alcohol use: Yes    Comment: occ    Family History  Problem Relation Age of Onset   Cancer Mother    Kidney failure Sister    Cancer Sister      Review of Systems Pertinent items are noted in HPI.  Objective:   Patient Vitals for the past 8 hrs:  BP Temp Temp src Pulse Resp SpO2  07/26/24 0030 (!) 140/88 -- -- 91 17 94 %  07/26/24 0015 129/87 -- -- 92 (!) 24 95 %  07/26/24 0000 139/86 98.1 F (36.7 C) Oral 90 (!) 25 94 %  07/25/24 2345 135/85 -- Oral 97 (!) 27 95 %  07/25/24 2220 131/80 -- -- 73 19 95 %  07/25/24 2215 135/84 -- -- 76 18 95 %  07/25/24 2210 132/85 -- -- 71 17 95 %  07/25/24 2206 133/85 -- -- 80 20 95 %  07/25/24 2204 133/85 -- -- 80 (!) 21 95 %  07/25/24 2202 135/87 -- -- 80 (!) 22 96 %  07/25/24 2200 (!) 141/95 -- -- 87 (!) 23 95 %  07/25/24 2148 136/88 -- --  80 18 97 %  07/25/24 2145 122/87 -- -- 84 -- 99 %  07/25/24 2144 126/88 -- -- 81 18 100 %  07/25/24 2142 (!) 125/96 -- -- 83 -- 98 %  07/25/24 2140 (!) 150/109 -- -- 75 -- 99 %  07/25/24 2138 (!) 150/110 -- -- 80 -- 94 %  07/25/24 2136 (!) 158/106 -- -- 80 -- 98 %  07/25/24 2134 (!) 152/118 -- -- 79 -- 93 %  07/25/24 2132 (!) 161/107 -- -- 77 -- 99 %  07/25/24 2130 (!) 144/104 -- -- 71 -- 99 %  07/25/24 2120 (!) 172/110 -- -- 88 -- 98 %  07/25/24 2100 -- -- -- 91 -- 97 %  07/25/24 2045 -- -- -- 90 -- 95 %  07/25/24 2030 -- -- -- 88 -- 100 %  07/25/24 2015 -- -- -- 95 -- 100 %  07/25/24 2000 -- 98 F (36.7 C) Axillary 82 -- 100 %  07/25/24 1945 -- -- -- 79 -- 95 %  07/25/24 1915 (!) 181/126 -- -- -- -- --  07/25/24 1900 (!) 196/118 -- -- -- -- --  07/25/24 1845 (!) 187/125 -- -- -- -- --  07/25/24 1815 -- -- -- 80 -- 100 %  07/25/24 1800 -- -- -- 75 -- 98 %  07/25/24 1745 -- -- -- 76 -- 99 %   07/25/24 1735 (!) 203/113 -- -- 80 18 98 %   I/O last 3 completed shifts: In: 1350 [IV Piggyback:1350] Out: -  Total I/O In: 2509.2 [I.V.:1110.1; IV Piggyback:1399.2] Out: -     Exam: GCS 3E 4V 39M Oriented to self only Confusion vs aphasia with speech. Slurred speech PERRL, conjugate gaze, FS Commands BUE and BLE full strength    Data ReviewCBC:  Lab Results  Component Value Date   WBC 17.8 (H) 07/25/2024   RBC 5.24 (H) 07/25/2024   BMP:  Lab Results  Component Value Date   GLUCOSE 139 (H) 07/25/2024   CO2 25 07/25/2024   BUN 12 07/25/2024   CREATININE 0.77 07/25/2024   CALCIUM 9.9 07/25/2024

## 2024-07-26 NOTE — Progress Notes (Signed)
 PT Cancellation Note  Patient Details Name: Daisy [name] MRN: [id number] DOB: [birth year]   Cancelled Treatment:    Reason Eval/Treat Not Completed: Patient not medically ready (await IR this afternoon with further coiling vs sx intervention. will await post procedure clearance)   Sadhana Frater B Arles Rumbold 07/26/2024, 12:13 PM Lenoard SQUIBB, PT Acute Rehabilitation Services Office: [phone]

## 2024-07-26 NOTE — Sedation Documentation (Signed)
 Family updated as to patient's status. Spoke with Jermaine (Son)

## 2024-07-26 NOTE — Progress Notes (Signed)
 Patient transported to IR. Patient under care of IR staff and remains on 4NICU(28) monitor. EVD clamped during procedure per Dr Lanis.

## 2024-07-26 NOTE — Anesthesia Procedure Notes (Signed)
 Procedure Name: Intubation Date/Time: 07/26/2024 3:50 PM  Performed by: Emmitt Millman, CRNAPre-anesthesia Checklist: Patient identified, Emergency Drugs available, Suction available and Patient being monitored Patient Re-evaluated:Patient Re-evaluated prior to induction Oxygen Delivery Method: Circle system utilized Preoxygenation: Pre-oxygenation with 100% oxygen Induction Type: IV induction Ventilation: Mask ventilation without difficulty Laryngoscope Size: Mac and 3 Grade View: Grade I Tube type: Oral Tube size: 7.0 mm Number of attempts: 1 Airway Equipment and Method: Stylet and Oral airway Placement Confirmation: ETT inserted through vocal cords under direct vision, positive ETCO2 and breath sounds checked- equal and bilateral Secured at: 21 cm Tube secured with: Tape Dental Injury: Teeth and Oropharynx as per pre-operative assessment

## 2024-07-26 NOTE — Progress Notes (Signed)
 SLP Cancellation Note  Patient Details Name: Daisy [name] MRN: [id number] DOB: Dec 01, 1955   Cancelled treatment:       Reason Eval/Treat Not Completed: Medical issues which prohibited therapy (Chart review completed. Per Neuro note, need for angiogram, possible coiling vs. Open surgical clipping of aneurysm. Pt scheduled for this PM. Will defer cognitive-linguistic evaluation at this time given continued medical workup and poss intervention.)  Delon Bangs, M.S., CCC-SLP Speech-Language Pathologist Secure Chat Preferred  O: [phone]  Delon CHRISTELLA Bangs 07/26/2024, 10:54 AM

## 2024-07-26 NOTE — Progress Notes (Signed)
 eLink Physician-Brief Progress Note Patient Name: Daisy [name] DOB: [birth year] MRN: [id number]   Date of Service  07/26/2024  HPI/Events of Note  asking for [facility] to use tube based off KUB  eICU Interventions  verified     Intervention Category Minor Interventions: Routine modifications to care plan (e.g. PRN medications for pain, fever)  Nolia Tschantz 07/26/2024, 9:26 PM

## 2024-07-26 NOTE — Anesthesia Postprocedure Evaluation (Signed)
 Anesthesia Post Note  Patient: Daisy [name]  Procedure(s) Performed: RADIOLOGY WITH ANESTHESIA     Patient location during evaluation: PACU Anesthesia Type: General Level of consciousness: awake and alert and lethargic Pain management: pain level controlled Vital Signs Assessment: post-procedure vital signs reviewed and stable Respiratory status: spontaneous breathing, nonlabored ventilation, respiratory function stable and patient connected to nasal cannula oxygen Cardiovascular status: blood pressure returned to baseline and stable Postop Assessment: no apparent nausea or vomiting Anesthetic complications: no   No notable events documented.  Last Vitals:  Vitals:   07/26/24 2045 07/26/24 2100  BP:  132/78  Pulse: (!) 102 95  Resp: 16 16  Temp:    SpO2: 96% 93%    Last Pain:  Vitals:   07/26/24 2000  TempSrc: Axillary  PainSc:     LLE Motor Response: Non-purposeful movement (07/26/24 2100)   RLE Motor Response: Non-purposeful movement (07/26/24 2100)        Epifanio Lamar BRAVO

## 2024-07-26 NOTE — Progress Notes (Signed)
 Lactate cleared

## 2024-07-26 NOTE — Anesthesia Procedure Notes (Signed)
 Arterial Line Insertion Start/End9/11/2023 3:35 PM, 07/26/2024 3:40 PM Performed by: Emmitt Millman, CRNA, CRNA  Patient location: Pre-op. Preanesthetic checklist: patient identified, IV checked, site marked, risks and benefits discussed, surgical consent, monitors and equipment checked, pre-op evaluation, timeout performed and anesthesia consent Lidocaine  1% used for infiltration Left, radial was placed Catheter size: 20 G Hand hygiene performed , maximum sterile barriers used  and Seldinger technique used Allen's test indicative of satisfactory collateral circulation Attempts: 1 Procedure performed without using ultrasound guided technique. Following insertion, dressing applied. Post procedure assessment: normal and unchanged  Patient tolerated the procedure well with no immediate complications.

## 2024-07-26 NOTE — Transfer of Care (Signed)
 Immediate Anesthesia Transfer of Care Note  Patient: Daisy [name]  Procedure(s) Performed: RADIOLOGY WITH ANESTHESIA  Patient Location: PACU  Anesthesia Type:General  Level of Consciousness: drowsy  Airway & Oxygen Therapy: Patient Spontanous Breathing  Post-op Assessment: Report given to RN and Post -op Vital signs reviewed and stable  Post vital signs: Reviewed and stable  Last Vitals:  Vitals Value Taken Time  BP    Temp    Pulse 94 07/26/24 17:33  Resp 21 07/26/24 17:33  SpO2 91 % 07/26/24 17:33  Vitals shown include unfiled device data.  Last Pain:  Vitals:   07/26/24 1400  TempSrc:   PainSc: Asleep      Patients Stated Pain Goal: 0 (07/26/24 0000)  Complications: No notable events documented.

## 2024-07-26 NOTE — Progress Notes (Signed)
 eLink Physician-Brief Progress Note Patient Name: Daisy [name] DOB: 1956/05/18 MRN: [id number]   Date of Service  07/26/2024  HPI/Events of Note  68 year old female with a history of COPD, essential hypertension, polysubstance abuse with ethanol use initially presented with confusion for 1 week found down at home and confused.  She was found to have a subarachnoid hemorrhage and a saccular aneurysm in the anterior communicating artery, transferred to [facility] for further evaluation.  Patient is tachypneic but otherwise normal vitals.  Saturating 95% on room air.  Results show severe electrolyte disturbances, elevated lactic acid, polycythemia with leukocytosis.  eICU Interventions  Neurosurgery following, maintain strict blood pressure less than 140, n.p.o., head of bed elevation, normal natremia.  Anticipating neuro IR intervention for ACOM aneurysm  Interval imaging per neurosurgery.  Broad-spectrum antibiotics with ceftriaxone  and vancomycin , maintain IV fluids  Monitor for alcohol withdrawal  DVT prophylaxis with SCDs, chemoprophylaxis contraindicated GI prophylaxis-add pantoprazole      Intervention Category Evaluation Type: New Patient Evaluation  Daisy [name] 07/26/2024, 12:13 AM

## 2024-07-26 NOTE — Procedures (Signed)
 PREOP DX: Hydrocephalus  POSTOP DX: Same  PROCEDURE: right frontal ventriculostomy   SURGEON: Dr. Dorn Glade, MD  ANESTHESIA: IV Sedation (versed  and fentanyl ) with Local  EBL: Minimal  SPECIMENS: None  COMPLICATIONS: None  CONDITION: Hemodynamically stable  INDICATIONS: Mrs. Daisy [name] is a 67 y.o. female who presented with hydrocephalus and aSAH d/t ruptured acom. Attempted to call son but he did not answer. Patient is not consentable  PROCEDURE IN DETAIL: After consent was obtained from the patient's family, skin of the right frontal scalp was clipped, prepped and draped in the usual sterile fashion.  A timeout was performed. Preoperative antibiotics were administered. Scalp was then infiltrated with local anesthetic with epinephrine .  Skin incision was made sharply, and twist drill burr hole was made.  The dura was then incised, and the ventricular catheter was passed in 1 attempt into the right lateral ventricle at a depth of 7 cm.  Good CSF flow was obtained.  The catheter was then tunneled subcutaneously and connected to a drainage system and the skin incision closed.  The drain was then secured in place.  FINDINGS: 1. Opening pressure high 2. Slightly bloody CSF

## 2024-07-26 NOTE — Progress Notes (Signed)
 Per nurse patient is unavailable for EEG due to a pending IR procedure. Next available tech will complete EEG once patient is available.

## 2024-07-26 NOTE — Progress Notes (Addendum)
  NEUROSURGERY PROGRESS NOTE   Pt seen and examined. History reviewed in EMR and with patient, although she is somewhat confused, unable to provide detailed history. Apparently wasn't feeling well for several days, reports onset of HA yesterday. No associated visual changes or new weakness. Admitted last night, EVD placed. No issues overnight.  Of  note, PMHx significant for HTN, DM, COPD, hx of EtOH and polysubstance abuse. Not on AC/AP x baby ASA. She does not report cigarette smoking. No known FHx of aneurysms.  EXAM: Temp:  [98 F (36.7 C)-99.9 F (37.7 C)] 98.2 F (36.8 C) (09/01 0400) Pulse Rate:  [71-104] 89 (09/01 0715) Resp:  [14-27] 15 (09/01 0715) BP: (110-203)/(76-130) 119/82 (09/01 0715) SpO2:  [88 %-100 %] 93 % (09/01 0715) Weight:  [67 kg] 67 kg (09/01 0500) Intake/Output      08/31 0701 09/01 0700 09/01 0701 09/02 0700   I.V. (mL/kg) 2233.7 (33.3)    IV Piggyback 3387.4    Total Intake(mL/kg) 5621.1 (83.9)    Urine (mL/kg/hr) 0    Drains 49    Total Output 49    Net +5572.1         Urine Occurrence 1 x     Awake, alert, confused Speech fluent CN grossly intact 5/5 BUE/BLE EVD in place, patent  LABS: Lab Results  Component Value Date   CREATININE 0.77 07/25/2024   BUN 12 07/25/2024   NA 140 07/25/2024   K 2.6 (LL) 07/25/2024   CL 98 07/25/2024   CO2 25 07/25/2024   Lab Results  Component Value Date   WBC 17.3 (H) 07/26/2024   HGB 14.3 07/26/2024   HCT 41.5 07/26/2024   MCV 85.2 07/26/2024   PLT 247 07/26/2024    IMAGING: CT/CTA personally reviewed and demonstrates diffuse basal SAH with associated ventriculomegaly, f/u with right frontal EVD in good position and slight decompression of ventricles. CTA reveals an ~81mm rightward projecting aneurysm arising from the left A1-2 junction as likely source of hemorrhage.  IMPRESSION: - 68 y.o. 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 Open surgical clipping of aneurysm. She gave consent to speak with her son regarding her condition. I will call [place] (son) to obtain consent as the patient appears too confused to fully appreciate a discussion regarding risks/benefits/alternatives and provide informed consent.    Gerldine Maizes, MD [facility]

## 2024-07-26 NOTE — Progress Notes (Signed)
 I was able to contact the patient's son, Jermaine Fergusson, over the phone. I reviewed with him the diagnostic test so far. We discussed the need for diagnostics cerebral angiogram and potential treatment options for the anterior communicating artery aneurysm. We did discuss the likelihood of endovascular coil embolization and the possible need for craniotomy for clipping. We discussed the details of these procedures and the expected post procedure course and recovery. We discussed the risks of both procedures to include risk of stroke or bleeding, arterial dissection, contrast nephropathy, and groin hematoma, as well as potential risks of craniotomy including infection and seizure. All his questions were answered. He provided informed consent on the patient's behalf to proceed.   Gerldine Maizes, MD  [facility]

## 2024-07-26 NOTE — Progress Notes (Addendum)
   07/26/24 1930  NIH Stroke Scale   Dizziness Present No (uta)  Headache Present No (uta)  Interval Neuro change (Garst paged)  Level of Consciousness (1a.)    2  LOC Questions (1b. )    2  LOC Commands (1c. )    2  Best Gaze (2. )   1  Visual (3. )   1 (Right upper quadrant)  Facial Palsy (4. )     0  Motor Arm, Left (5a. )    2  Motor Arm, Right (5b. )  3  Motor Leg, Left (6a. )   2  Motor Leg, Right (6b. )  3  Limb Ataxia (7. ) 0  Sensory (8. )   1  Best Language (9. )   3  Dysarthria (10. ) 2  Extinction/Inattention (11.)    1  Complete NIHSS TOTAL 25   Garst paged. I informed doc of assessment and requested a STAT Head WO and to place an NG d/t lethargy. MD agreed, VO placed for head CT WO.   Addendum- Scan stable per MD and Rad. Verbal received to place and maintain NGT since pt unable to f/c and unable to swallow. NGT placed and STAT KUB ordered and shot. Awaiting Rad studies before giving Nimodipine .

## 2024-07-26 NOTE — Op Note (Signed)
 ENDOVASCULAR NEUROSURGERY OPERATIVE NOTE   PROCEDURE: Diagnostic cerebral angiogram Coil embolization of anterior communicating artery aneurysm   HISTORY:   The patient is a 68 y.o. yo female presenting to the hospital with headache and generalized malaise.  Her CT scan revealed diffuse subarachnoid hemorrhage and ventriculomegaly.  Ventricular drain was placed.  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  She therefore presents for diagnostic cerebral angiogram and possible aneurysm coiling.  APPROACH:   The technical aspects of the procedure as well as its potential risks and benefits were reviewed with the patient and her son. These risks included but were not limited bleeding, infection, allergic reaction, damage to organs/vital structures, stroke, non-diagnostic procedure, and the catastrophic outcomes of heart attack, coma, and death. With an understanding of these risks, informed consent was obtained and witnessed.    The patient was placed in the supine position on the angiography table and the skin of right groin prepped in the usual sterile fashion. The procedure was performed under general anesthesia monitored by the anesthesia service.  Short 5Fr sheath was placed in the right common femoral artery using standard seldinger technique. Position of the sheath was documented with fluoro-phase images.    HEPARIN:  0 Units total.    CONTRAST AGENT:  See IR records   FLUOROSCOPY TIME:  See IR records    CATHETER(S) AND WIRE(S):    5-French JB-1 glidecatheter   0.035" glidewire   80cm 8Fr NeuronMax guide sheath 125cm Berenstein select catheter 115cm Cat 5 Guide catheter 150cm XT 17 microcatheter Synchro select standard microwire  COILS USED: Target 360 XL 4mm x 8cm Target 360 XL 3mm x 9cm Target 360 XL 2mm x 6cm  VESSELS CATHETERIZED:   Right internal carotid   Left internal carotid   Left  vertebral    Left anterior cerebral artery  Right common femoral  VESSELS STUDIED:   Right internal carotid, head Left internal carotid, head Left vertebral  Left internal carotid, during embolization Left internal carotid, post-embolization Left internal carotid, final control  Right femoral  PROCEDURAL NARRATIVE:   A 5-Fr JB-1 terumo glide catheter was advanced over a 0.035 glidewire into the aortic arch. The diagnostic portion of the procedure was completed including catheterization of the above vessels and cervical/cerebral angiograms taken. After review of images, the catheter was removed without incident.  The guide sheath was then introduced over the select catheter and microwire.  Select catheter was then used to select the mid cervical left internal carotid artery. Guide sheath was then advanced over the select catheter into the proximal cervical left internal carotid artery. Select catheter was then removed.  Under roadmap guidance, the guide catheter was introduced over the 17 microcatheter and microwire.  Microwire and microcatheter were used to select the supraclinoid internal carotid, and the guide catheter was advanced to its final position in the cavernous segment of the left internal carotid. The aneurysm was then carefully catheterized and the microwire removed.  The above coils were then sequentially deployed with progressive exclusion of the aneurysm.  After coiling, the microcatheter was removed.  Immediate postembolization angiogram was taken.  The guide catheter and sheath were then withdrawn down and final control angiogram was taken from the cervical internal carotid artery.  The guide catheter and sheath were then synchronously removed without incident.  INTERPRETATION:   Right internal carotid, head:   Injection reveals the presence of a patent ICA, M1, and A1 segments  and their branches.  There is moderate to severe vasospasm involving the A1 and proximal A2 segment  on the left.  There is mild vasospasm involving the proximal M1 segment and the supraclinoid internal carotid artery.  There is an aneurysm projecting towards the right at the A1 A2 junction measuring approximately 5.3 x 3.1 mm.  The aneurysm has a small inferiorly projecting daughter sac at the dome.  There is a relatively narrow neck.  The parenchymal and venous phases are unremarkable. The venous sinuses are widely patent.    Left internal carotid, head:   Injection reveals the presence of a patent ICA, A1, and M1 segments and their branches. No aneurysms, arteriovenous malformations, or high flow fistulas are visualized.  There is however moderate to severe vasospasm involving the right A1 and proximal A2 segments.  There is mild spasm involving the supraclinoid internal carotid artery and the right M1.  The parenchymal and venous phases are unremarkable. The venous sinuses are widely patent.    Left vertebral:   Injection reveals the presence of a widely patent vertebral artery. This leads to a widely patent basilar artery that terminates in bilateral P1. No aneurysms, arteriovenous malformations, or high flow fistulas are visualized.  There is mild vasospasm involving the distal basilar artery.  The parenchymal and venous phases are normal. The venous sinuses are patent.    Left internal carotid, during embolization: Injection taken during coil embolization reveals widely patent internal carotid artery and middle cerebral artery.  There is trickle flow through the left A1 and more distal segments, although there is some flow visualized within the A2 and more distal segments.  This is likely due to underlying vasospasm as described above, coupled with presence of the 17 microcatheter.  Coil mass appears to be within the aneurysm, without prolapse into the anterior cerebral artery.  Left internal carotid, post-embolization: Injection reveals patent internal carotid artery, with stable appearance of  vasospasm involving the left anterior cerebral artery.  There is mild spasm involving the left middle cerebral artery.  Coil mass is within the aneurysm, without prolapse into the anterior cerebral artery.  There is minimal aneurysm neck residual.  The distal left anterior cerebral artery remains patent.  Left internal carotid, final control: Injection reveals patent internal carotid artery, and stable appearance of anterior and middle cerebral arteries.  Coil mass within the aneurysm is in stable position.  Capillary phase demonstrates good perfusion of the distal anterior and middle cerebral artery territories, without any identified branch occlusions.  Venous sinuses remain patent.  Right femoral:    Normal vessel. No significant atherosclerotic disease. Arterial sheath in adequate position.   DISPOSITION:  Upon completion of the study, the femoral sheath was removed and hemostasis obtained using a 8-Fr Angio-Seal closure device. Good proximal and distal lower extremity pulses were documented upon achievement of hemostasis. The procedure was well tolerated and no early complications were observed.  The patient was transferred to the postanesthesia care unit to be positioned flat in bed for 4 hours.    IMPRESSION:  1. 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 No immediate local thrombotic or distal embolic complications were noted. 2. 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 There is mild spasm of the distal basilar artery.    Gerldine Maizes, MD [facility]

## 2024-07-26 NOTE — Progress Notes (Signed)
 NAME:  Daisy [name], MRN:  [id number], DOB:  1956/09/03, LOS: 1 ADMISSION DATE:  07/25/2024, CONSULTATION DATE:  07/25/2024 REFERRING MD:  Dorn Glade, MD, CHIEF COMPLAINT:  Sepsis/SAH   History of Present Illness:  68 y/o female who has a PMH for COPD not on home O2, Essential HTN, DMT2, Vitamin D deficiency, GERD, chronic back pain, ETOH abuse, Polysubstance abuse who presented to [facility] for confusion and not feel well for the last 1 week.  Son called 911.  EMS found patient on the ground and drenched in her urine with tachycardia and temp 99.1. Per notes she was more confused today and has been urinating frequently and on herself.  Per records she has been having headaches.  She drinks 2-6 beers daily and does cocaine-last done 1 week ago.  In the ED her K 2.6, AG 17, LA 3--->5.9, WBC 17.8.  She was treated for sepsis and started on antibiotics.  Due to confusion, she had a CT head which revealed a SAH.  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  CT chest/abd/pelvis did not reveal any acute issues.  Pertinent  Medical History  COPD not on home O2, Essential HTN, DMT2, Vitamin D deficiency, GERD, chronic back pain, ETOH abuse, Polysubstance abuse   Significant Hospital Events: Including procedures, antibiotic start and stop dates in addition to other pertinent events   8/31: transferred to [facility] ICU from [facility] acute SAH d/t ruptured Acom and associated Hydrocephalus   Interim History / Subjective:  N/a  Objective    Blood pressure (!) 127/48, pulse (!) 102, temperature 98.2 F (36.8 C), temperature source Oral, resp. rate (!) 31, height 5' 6 (1.676 m), weight 67 kg, SpO2 94%.        Intake/Output Summary (Last 24 hours) at 07/26/2024 9178 Last data filed at 07/26/2024 0700 Gross per 24 hour  Intake 5621.1 ml  Output 49 ml  Net 5572.1 ml   Filed Weights   07/25/24 1553 07/26/24  0500  Weight: 67 kg 67 kg    Examination: General Resting in bed no acute distress HEENT normocephalic atraumatic pupils equal reactive, right EVD draining clear CSF Pulmonary clear to auscultation Cardiac regular rate and rhythm Abdomen soft nontender no organomegaly Extremities warm dry brisk cap refill Neuro awake oriented x 3 appropriate good strength bilaterally GU clear yellow Resolved problem list   Assessment and Plan  SAH 2/2 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 (Higher risk based on H&H score 3  Get EEG Serial neuro checks For arteriogram and coiling in neuro IR today  NPO   H/o ETOH use, polysubstance abuse (cocaine) and chr pain -could certainly make course complicated w/ concern for wd Plan Thiamine  and FA Monitor for withdrawal Prn pain meds  SIRS from above vs Sepsis Source not clear, wbc about same  Plan Cont empiric abx F/u cultures   Lactic acidosis ? Sepsis; seems to have hit a plateau  Trending 5.9->4.1 Plan Keep euvolemic F/u lactic acid also checking total CKs, with her delirium and lactic acid without hypotension I wonder about seizure   Intermittent Fluid and electrolyte imbalance  Plan Cont to trend  HTN Plan Holding home meds  DMT2 Plan Goal 140-180 SSI   H/o COPD No s/sx  exacerbation  Plan PRN SABA     Critical care time:32 min

## 2024-07-26 NOTE — Anesthesia Preprocedure Evaluation (Addendum)
 Anesthesia Evaluation  Patient identified by MRN, date of birth, ID band Patient awake    Reviewed: Allergy & Precautions, NPO status , Patient's Chart, lab work & pertinent test results  Airway Mallampati: II  TM Distance: >3 FB Neck ROM: Full    Dental   Pulmonary    Pulmonary exam normal        Cardiovascular hypertension, Pt. on medications + Peripheral Vascular Disease and + DOE  Normal cardiovascular exam     Neuro/Psych CVA [facility] with aneurysm)    GI/Hepatic negative GI ROS,,,(+)     substance abuse  alcohol use and cocaine use  Endo/Other  negative endocrine ROS    Renal/GU negative Renal ROS     Musculoskeletal   Abdominal   Peds  Hematology negative hematology ROS (+)   Anesthesia Other Findings   Reproductive/Obstetrics                              Anesthesia Physical Anesthesia Plan  ASA: 4 and emergent  Anesthesia Plan: General   Post-op Pain Management:    Induction: Intravenous  PONV Risk Score and Plan: 3 and Ondansetron , Dexamethasone  and Treatment may vary due to age or medical condition  Airway Management Planned: Oral ETT  Additional Equipment: Arterial line  Intra-op Plan:   Post-operative Plan: Extubation in OR and Possible Post-op intubation/ventilation  Informed Consent: I have reviewed the patients History and Physical, chart, labs and discussed the procedure including the risks, benefits and alternatives for the proposed anesthesia with the patient or authorized representative who has indicated his/her understanding and acceptance.     Dental advisory given  Plan Discussed with: CRNA  Anesthesia Plan Comments: (Pre-induction a-line. 2 IV's. GETA.)         Anesthesia Quick Evaluation

## 2024-07-26 NOTE — H&P (Incomplete)
 NAME:  Daisy [name], MRN:  [id number], DOB:  06/21/1956, LOS: 0 ADMISSION DATE:  07/25/2024, CONSULTATION DATE:  07/25/2024 REFERRING MD:  Dorn Glade, MD, CHIEF COMPLAINT:  Sepsis/SAH   History of Present Illness:  68 y/o female who has a PMH for COPD not on home O2, Essential HTN, DMT2, Vitamin D deficiency, GERD, chronic back pain, ETOH abuse, Polysubstance abuse who presented to [facility] for confusion and not feel well for the last 1 week.  Son called 911.  EMS found patient on the ground and drenched in her urine with tachycardia and temp 99.1. Per notes she was more confused today and has been urinating frequently and on herself.  Per records she has been having headaches.  She drinks 2-6 beers daily and does cocaine-last done 1 week ago. In the ED her K 2.6, AG 17, LA 3--->5.9, WBC 17.8.  She was treated for sepsis and started on antibiotics.  Due to confusion, she had a CT head which revealed a SAH.  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  CT chest/abd/pelvis did not reveal any acute issues.  Pertinent  Medical History  COPD not on home O2, Essential HTN, DMT2, Vitamin D deficiency, GERD, chronic back pain, ETOH abuse, Polysubstance abuse   Significant Hospital Events: Including procedures, antibiotic start and stop dates in addition to other pertinent events   8/31: transferred to [facility] ICU from [facility]  Interim History / Subjective:  N/a  Objective    Blood pressure 131/80, pulse 73, temperature 98 F (36.7 C), temperature source Axillary, resp. rate 19, height 5' 6 (1.676 m), weight 67 kg, SpO2 95%.        Intake/Output Summary (Last 24 hours) at 07/25/2024 2352 Last data filed at 07/25/2024 1920 Gross per 24 hour  Intake 2449.11 ml  Output --  Net 2449.11 ml   Filed Weights   07/25/24 1553  Weight: 67 kg    Examination: General: Awake alert NAD HENT: PERRLA no  icterus EOMI, supple, no LN no JVD Lungs: CTA no wheezes rales or rhonchi Cardiovascular: reg s1s2 no murmurs gallops or rubs Abdomen: soft nt nd bs pos no guarding Extremities: no cyanosis, clubbing or edema Neuro: Alert and awake, Oriented x 3, NAD, CN II to XII grossly intact, strength 5/5, sensation intact, finger to nose and heal to shin wnl, some intermittent mild confusion GU: n/a  Resolved problem list   Assessment and Plan  Sepsis Not sure the source, resp panel negative, blood cultures have been sent She has received Ceftriaxone  and Vancomycin , continue for now Lactic acidosis Continue to trend, not clear the source IV fluids Hypokalemia Repeat CMP Replace as needed SAH BP management Currently on Claviprex to keep SBP<140 Saccular Aneurysm of  Anterior Communicating Artery  For possible  surgery in am Keep NPO HTN Claviprex for now Po home meds when appropriate DMT2 FS and SSI COPD Albuterol  nebs prn No signs of exacerbation ETOH use Thiamine  and FA Monitor for withdrawal Polysubstance use Supportive care Chronic back pain Prn pain meds   Labs   CBC: Recent Labs  Lab 07/25/24 1602  WBC 17.8*  NEUTROABS 14.5*  HGB 15.4*  HCT 44.9  MCV 85.7  PLT 292    Basic Metabolic Panel: Recent Labs  Lab 07/25/24 1602  NA 140  K 2.6*  CL 98  CO2 25  GLUCOSE 139*  BUN 12  CREATININE  0.77  CALCIUM 9.9   GFR: Estimated Creatinine Clearance: 64.8 mL/min (by C-G formula based on SCr of 0.77 mg/dL). Recent Labs  Lab 07/25/24 1602 07/25/24 1635 07/25/24 1849 07/25/24 2054 07/25/24 2137  WBC 17.8*  --   --   --   --   LATICACIDVEN  --  3.0* 4.2* 2.7* 5.9*    Liver Function Tests: Recent Labs  Lab 07/25/24 1602  AST 36  ALT 18  ALKPHOS 121  BILITOT 0.9  PROT 8.2*  ALBUMIN 4.3   Recent Labs  Lab 07/25/24 1602  LIPASE 25   Recent Labs  Lab 07/25/24 2137  AMMONIA 31    ABG    Component Value Date/Time   HCO3 26.7 07/25/2024  2137   O2SAT 86.6 07/25/2024 2137     Coagulation Profile: Recent Labs  Lab 07/25/24 1602  INR 1.2    Cardiac Enzymes: Recent Labs  Lab 07/25/24 1602  CKTOTAL 284*    HbA1C: No results found for: HGBA1C  CBG: No results for input(s): GLUCAP in the last 168 hours.  Review of Systems:   Currently no c/o HA, BV, N/V/D, abd pain, chest pain,   Past Medical History:  She,  has a past medical history of DOE (dyspnea on exertion) and Hypertension.   Surgical History:   Past Surgical History:  Procedure Laterality Date   CARPAL TUNNEL RELEASE       Social History:   reports that she has never smoked. She has never used smokeless tobacco. She reports current alcohol use. She reports current drug use. Drug: Marijuana.   Family History:  Her family history includes Cancer in her mother and sister; Kidney failure in her sister.   Allergies Allergies  Allergen Reactions   Codeine     REACTION: mental status change     Home Medications  Prior to Admission medications   Medication Sig Start Date End Date Taking? Authorizing Provider  amLODipine  (NORVASC ) 5 MG tablet Take 5 mg by mouth daily.    [provider]  aspirin EC 81 MG tablet Take 81 mg by mouth daily. Swallow whole.    [provider]  gabapentin (NEURONTIN) 400 MG capsule Take 1 capsule by mouth at bedtime.    [provider]  ibuprofen  (ADVIL ) 200 MG tablet Take 200 mg by mouth every 6 (six) hours as needed.    [provider]  Multiple Vitamins-Minerals (CENTRUM ADULTS PO) Take by mouth.    [provider]  omeprazole (PRILOSEC) 40 MG capsule Take 1 tablet by mouth daily.    [provider]  Simethicone  180 MG CAPS Take by mouth.    [provider]  valACYclovir (VALTREX) 1000 MG tablet Take 1,000 mg by mouth 2 (two) times daily. 09/14/20   [provider]     Critical care time: 51   The patient is critically ill with multiple  organ system failure and requires high complexity decision making for assessment and support, frequent evaluation and titration of therapies, advanced monitoring, review of radiographic studies and interpretation of complex data.   Critical Care Time devoted to patient care services, exclusive of separately billable procedures, described in this note is 34 minutes.   Orlin Fairly, MD [facility] See Amion for pager  If no response to pager , please call [phone] until 7pm After 7:00 pm call Elink  [phone] 07/25/2024, 11:52 PM

## 2024-07-26 NOTE — Progress Notes (Signed)
   07/26/24 0200  Neurological  Neuro (WDL) X  Level of Consciousness Responds to Voice  Orientation Level Disoriented X4  Cognition Poor attention/concentration;Unable to follow commands  Speech Clear  R Pupil Size (mm) 3  R Pupil Shape Round  R Pupil Reaction Brisk  L Pupil Size (mm) 3  L Pupil Shape Round  L Pupil Reaction Brisk  Richmond Agitation Sedation Scale  Richmond Agitation Sedation Scale (RASS) -3  RASS Goal 0  NIH Stroke Scale   Dizziness Present No  Headache Present No  Interval Other (Comment) (NIH Jump from 3-15  due to sedation)  Level of Consciousness (1a.)    2  LOC Questions (1b. )    1  LOC Commands (1c. )    2  Best Gaze (2. )   0  Visual (3. )   0  Facial Palsy (4. )     0  Motor Arm, Left (5a. )    2  Motor Arm, Right (5b. )  2  Motor Leg, Left (6a. )   2  Motor Leg, Right (6b. )  2  Limb Ataxia (7. ) 0  Sensory (8. )   0  Best Language (9. )   1  Dysarthria (10. ) 0  Extinction/Inattention (11.)    1  Complete NIHSS TOTAL 15   Patient sedated with 100 mcg fentanyl  and 2 mg versed . Dr. Darnella said he expected NIH to be poor now. Instructed to follow and call back if needed.

## 2024-07-26 NOTE — Progress Notes (Signed)
 CCM notified of critical lactic acid of 4.1.   Dr Lanis at the bedside and aware of patient's unequal pupil assessment. Left pupil larger than right. No other neuro changes noted.

## 2024-07-26 DEATH — deceased

## 2024-07-27 ENCOUNTER — Inpatient Hospital Stay (HOSPITAL_COMMUNITY)

## 2024-07-27 ENCOUNTER — Encounter (HOSPITAL_COMMUNITY): Payer: Self-pay | Admitting: Neurosurgery

## 2024-07-27 DIAGNOSIS — I607 Nontraumatic subarachnoid hemorrhage from unspecified intracranial artery: Secondary | ICD-10-CM

## 2024-07-27 DIAGNOSIS — R7881 Bacteremia: Secondary | ICD-10-CM | POA: Diagnosis not present

## 2024-07-27 DIAGNOSIS — I609 Nontraumatic subarachnoid hemorrhage, unspecified: Secondary | ICD-10-CM

## 2024-07-27 DIAGNOSIS — R569 Unspecified convulsions: Secondary | ICD-10-CM | POA: Diagnosis not present

## 2024-07-27 DIAGNOSIS — I10 Essential (primary) hypertension: Secondary | ICD-10-CM | POA: Diagnosis not present

## 2024-07-27 DIAGNOSIS — E876 Hypokalemia: Secondary | ICD-10-CM | POA: Diagnosis not present

## 2024-07-27 DIAGNOSIS — R6510 Systemic inflammatory response syndrome (SIRS) of non-infectious origin without acute organ dysfunction: Secondary | ICD-10-CM | POA: Diagnosis not present

## 2024-07-27 HISTORY — PX: IR NEURO EACH ADD'L AFTER BASIC UNI LEFT (MS): IMG5373

## 2024-07-27 LAB — CBC
HCT: 36.7 % (ref 36.0–46.0)
Hemoglobin: 13 g/dL (ref 12.0–15.0)
MCH: 29.7 pg (ref 26.0–34.0)
MCHC: 35.4 g/dL (ref 30.0–36.0)
MCV: 84 fL (ref 80.0–100.0)
Platelets: 227 K/uL (ref 150–400)
RBC: 4.37 MIL/uL (ref 3.87–5.11)
RDW: 13.6 % (ref 11.5–15.5)
WBC: 16.7 K/uL — ABNORMAL HIGH (ref 4.0–10.5)
nRBC: 0 % (ref 0.0–0.2)

## 2024-07-27 LAB — URINE DRUG SCREEN
Amphetamines: NEGATIVE
Barbiturates: NEGATIVE
Benzodiazepines: NEGATIVE
Cocaine: NEGATIVE
Fentanyl: NEGATIVE
Methadone Scn, Ur: NEGATIVE
Opiates: NEGATIVE
Tetrahydrocannabinol: POSITIVE — AB

## 2024-07-27 LAB — BASIC METABOLIC PANEL WITH GFR
Anion gap: 13 (ref 5–15)
BUN: 8 mg/dL (ref 8–23)
CO2: 22 mmol/L (ref 22–32)
Calcium: 8.4 mg/dL — ABNORMAL LOW (ref 8.9–10.3)
Chloride: 105 mmol/L (ref 98–111)
Creatinine, Ser: 0.71 mg/dL (ref 0.44–1.00)
GFR, Estimated: >60 mL/min (ref >60)
Glucose, Bld: 113 mg/dL — ABNORMAL HIGH (ref 70–99)
Potassium: 3.2 mmol/L — ABNORMAL LOW (ref 3.5–5.1)
Sodium: 140 mmol/L (ref 135–145)

## 2024-07-27 LAB — ECHOCARDIOGRAM COMPLETE
Area-P 1/2: 5.38 cm2
Height: 66 in
S' Lateral: 2.46 cm
Weight: 2458.57 [oz_av]

## 2024-07-27 LAB — GLUCOSE, CAPILLARY
Glucose-Capillary: 100 mg/dL — ABNORMAL HIGH (ref 70–99)
Glucose-Capillary: 87 mg/dL (ref 70–99)
Glucose-Capillary: 88 mg/dL (ref 70–99)
Glucose-Capillary: 91 mg/dL (ref 70–99)
Glucose-Capillary: 98 mg/dL (ref 70–99)

## 2024-07-27 LAB — MAGNESIUM: Magnesium: 2.3 mg/dL (ref 1.7–2.4)

## 2024-07-27 MED ORDER — ACETAMINOPHEN 160 MG/5ML PO SOLN
650.0000 mg | Freq: Four times a day (QID) | ORAL | Status: DC | PRN
  Administered 2024-07-27 – 2024-08-03 (×9): 650 mg
  Filled 2024-07-27 (×8): qty 20.3

## 2024-07-27 MED ORDER — POTASSIUM CHLORIDE 10 MEQ/100ML IV SOLN
10.0000 meq | INTRAVENOUS | Status: AC
  Administered 2024-07-27 (×6): 10 meq via INTRAVENOUS
  Filled 2024-07-27 (×6): qty 100

## 2024-07-27 MED ORDER — POTASSIUM CHLORIDE 10 MEQ/50ML IV SOLN: 10.0000 meq | INTRAVENOUS | Status: DC

## 2024-07-27 MED ORDER — LEVETIRACETAM 500 MG PO TABS
500.0000 mg | ORAL_TABLET | Freq: Two times a day (BID) | ORAL | Status: AC
  Administered 2024-07-27 – 2024-08-01 (×11): 500 mg
  Filled 2024-07-27 (×11): qty 1

## 2024-07-27 MED ORDER — IOHEXOL 350 MG/ML SOLN
75.0000 mL | Freq: Once | INTRAVENOUS | Status: AC | PRN
  Administered 2024-07-27: 75 mL via INTRAVENOUS

## 2024-07-27 MED ORDER — POTASSIUM CHLORIDE CRYS ER 20 MEQ PO TBCR: 40.0000 meq | EXTENDED_RELEASE_TABLET | ORAL | Status: DC

## 2024-07-27 MED ORDER — THIAMINE MONONITRATE 100 MG PO TABS
100.0000 mg | ORAL_TABLET | Freq: Every day | ORAL | Status: DC
  Administered 2024-07-28 – 2024-08-13 (×17): 100 mg
  Filled 2024-07-27 (×17): qty 1

## 2024-07-27 MED ORDER — SODIUM CHLORIDE 0.9 % IV SOLN: INTRAVENOUS | Status: AC

## 2024-07-27 MED ORDER — ORAL CARE MOUTH RINSE
15.0000 mL | OROMUCOSAL | Status: DC | PRN
  Administered 2024-07-27: 15 mL via OROMUCOSAL

## 2024-07-27 MED ORDER — ORAL CARE MOUTH RINSE
15.0000 mL | OROMUCOSAL | Status: DC | PRN
Start: 2024-07-27 — End: 2024-07-27

## 2024-07-27 MED ORDER — FOLIC ACID 1 MG PO TABS
1.0000 mg | ORAL_TABLET | Freq: Every day | ORAL | Status: DC
  Administered 2024-07-28 – 2024-08-13 (×17): 1 mg
  Filled 2024-07-27 (×17): qty 1

## 2024-07-27 NOTE — TOC Initial Note (Signed)
 Transition of Care [facility]) - Initial/Assessment Note    Patient Details  Name: Daisy [name] MRN: [id number] Date of Birth: [birth year]  [facility] of [facility]) CM/SW Contact:    Inocente GORMAN Kindle, LCSW Phone Number: 07/27/2024, 5:12 PM  Clinical Narrative:                 Patient admitted from home s/p coiling. CSW following for medical appropriateness.   Expected Discharge Plan: Skilled Nursing Facility Barriers to Discharge: Continued Medical Work up, English as a second language teacher, SNF Pending bed offer   Patient Goals and CMS Choice            Expected Discharge Plan and Services In-house Referral: Clinical Social Work     Living arrangements for the past 2 months: Single Family Home                                      Prior Living Arrangements/Services Living arrangements for the past 2 months: Single Family Home   Patient language and need for interpreter reviewed:: Yes        Need for Family Participation in Patient Care: Yes (Comment) Care giver support system in place?: Yes (comment)   Criminal Activity/Legal Involvement Pertinent to Current Situation/Hospitalization: No - Comment as needed  Activities of Daily Living      Permission Sought/Granted      Share Information with NAME: Tache, Bobst   [phone]           Emotional Assessment Appearance:: Appears stated age Attitude/Demeanor/Rapport: Unable to Assess Affect (typically observed): Unable to Assess Orientation: :  (does not follow commands) Alcohol / Substance Use: Not Applicable Psych Involvement: No (comment)  Admission diagnosis:  SIRS (systemic inflammatory response syndrome) (HCC) [R65.10] SAH (subarachnoid hemorrhage) (HCC) [I60.9] Subarachnoid hemorrhage (HCC) [I60.9] Patient Active Problem List   Diagnosis Date Noted   Subarachnoid hemorrhage (HCC) 07/26/2024   SIRS (systemic inflammatory response syndrome) (HCC) 07/25/2024   SAH (subarachnoid hemorrhage) (HCC)  07/25/2024   Dyspnea on exertion 09/21/2020   Screening for cholesterol level 09/21/2020   Corneal abrasion 07/23/2011   Elbow pain, left 06/30/2011   Sore throat 02/12/2011   HYPOKALEMIA, MILD 11/04/2008   CARPAL TUNNEL SYNDROME 04/30/2007   Primary hypertension 01/22/2007   RHINITIS, ALLERGIC 01/22/2007   MENOPAUSAL SYNDROME 01/22/2007   PCP:  Darra Hamilton, PA-C Pharmacy:   [facility] [id number] [address], [place] - [address] AT [address] [place] [address] Phone: [phone] Fax: [phone]     Social Drivers of Health (SDOH) Social History: SDOH Screenings   Tobacco Use: Low Risk  (07/26/2024)   SDOH Interventions:     Readmission Risk Interventions     No data to display

## 2024-07-27 NOTE — Progress Notes (Signed)
 EEG complete - results pending

## 2024-07-27 NOTE — Progress Notes (Addendum)
   [facility] has been requested to perform a transesophageal echocardiogram on Daisy [name] for hyperechoic mobile density on MV seen on TTE this admission.     The patient does NOT have any absolute or relative contraindications to a Transesophageal Echocardiogram (TEE).  The patient has: No other conditions that may impact this procedure.   Patient is noted to be largely non-responsive following coilling. Discussed case with Dr. Jeffrie, reader B for tomorrow, plan to proceed as requested. As patient is non-consentable, nurse identified [name] as the next of kin being consented for procedures therefore Daisy with  Daisy [name], Daisy [name]. After careful review of history and examination, the risks and benefits of transesophageal echocardiogram have been explained including risks of esophageal damage, perforation (1:10,000 risk), bleeding, pharyngeal hematoma as well as other potential complications associated with conscious sedation including aspiration, arrhythmia, respiratory failure and death. Alternatives to treatment were discussed, questions were answered. Daisy verbalized understanding and grants permission for his mother to proceed. Consent was obtained with Dayna Dunn PA-C. We placed a copy of the consent on his paper chart.  Patient noted to have NG tube placed yesterday to begin tube feeds, will place care order to hold tube feeds starting at midnight for TEE tomorrow.   Signed, Leontine LOISE Salen, PA-C  07/27/2024 3:32 PM

## 2024-07-27 NOTE — Procedures (Signed)
 Patient Name: Daisy [name]  MRN: [id number]  Epilepsy Attending: Arlin MALVA Krebs  Referring Physician/Provider: Jenna Maude BRAVO, NP  Date: 07/27/2024 Duration: 24.36 mins  Patient history: 68 y.o. 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 EEG to evaluate for seizure.  Level of alertness:  comatose/ lethargic   AEDs during EEG study: LEV  Technical aspects: This EEG study was done with scalp electrodes positioned according to the 10-20 International system of electrode placement. Electrical activity was reviewed with band pass filter of 1-70Hz , sensitivity of 7 uV/mm, display speed of 50mm/sec with a 60Hz  notched filter applied as appropriate. EEG data were recorded continuously and digitally stored.  Video monitoring was available and reviewed as appropriate.  Description: EEG showed continuous generalized 3 to 5 Hz theta-delta slowing. Hyperventilation and photic stimulation were not performed.     ABNORMALITY - Continuous slow, generalized  IMPRESSION: This study is suggestive of moderate to severe diffuse encephalopathy. No seizures or epileptiform discharges were seen throughout the recording.  Weslie Rasmus O Rolla Kedzierski

## 2024-07-27 NOTE — Evaluation (Signed)
 Occupational Therapy Evaluation Patient Details Name: ISMELDA WEATHERMAN MRN: [id number] DOB: December 07, 1955 Today's Date: 07/27/2024   History of Present Illness   68 yo female adm 07/25/24 after found on ground with confusion 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 S/p diagnostic cerebral angiogram, coil embolization of ACA aneurysm 9/1. EEG 9/2 suggestive of moderate to severe diffuse encephalopathy. PMHx: HTN, polysubstance abuse, chronic back pain, T2DM, COPD     Clinical Impressions Pt ind at baseline with ADL/functional mobility, was living with her spouse PTA. Pt currently dependent for all ADLs, no command follow noted this session, pt with R neglect, L gaze and head turn throughout session. Pt with incr tone in LUE, RUE PROM WFL. Pt presenting with impairments listed below, will follow acutely. Patient will benefit from continued inpatient follow up therapy, <3 hours/day to maximize safety/ind with ADL/functional mobility.      If plan is discharge home, recommend the following:   Two people to help with walking and/or transfers;Two people to help with bathing/dressing/bathroom;Assistance with cooking/housework;Assistance with feeding;Direct supervision/assist for medications management;Direct supervision/assist for financial management;Assist for transportation;Help with stairs or ramp for entrance;Supervision due to cognitive status     Functional Status Assessment   Patient has had a recent decline in their functional status and demonstrates the ability to make significant improvements in function in a reasonable and predictable amount of time.     Equipment Recommendations   Other (comment) (defer)     Recommendations for Other Services   PT consult     Precautions/Restrictions   Precautions Precautions: Fall Precaution/Restrictions Comments: permissive HTN given vasospasm, no set amount but RN states <200 SBP. EVD clamped during session and RN returned at end of  session to unclamp Restrictions Weight Bearing Restrictions Per Provider Order: No     Mobility Bed Mobility Overal bed mobility: Needs Assistance Bed Mobility: Supine to Sit, Sit to Supine     Supine to sit: +2 for physical assistance, Total assist Sit to supine: Total assist, +2 for physical assistance   General bed mobility comments: assist for all aspects, once EOB pt with eye opening but with severe posterior and extensor bias trunk and LEs, requiring return to supine for safety. Rest of session in chair position    Transfers                   General transfer comment: deferred      Balance Overall balance assessment: Needs assistance Sitting-balance support: Feet supported, Bilateral upper extremity supported Sitting balance-Leahy Scale: Zero Sitting balance - Comments: total +2 to maintain upright Postural control: Posterior lean                                 ADL either performed or assessed with clinical judgement   ADL Overall ADL's : Needs assistance/impaired Eating/Feeding: NPO   Grooming: Total assistance   Upper Body Bathing: Total assistance   Lower Body Bathing: Total assistance   Upper Body Dressing : Total assistance   Lower Body Dressing: Total assistance   Toilet Transfer: Total assistance   Toileting- Clothing Manipulation and Hygiene: Total assistance   Tub/ Shower Transfer: Total assistance     General ADL Comments: dependent for all ADLs     Vision   Additional Comments: L gaze, L head turn, does not blink to threat on R side, no tracking noted during session     Perception Perception: Not tested  Praxis Praxis: Not tested       Pertinent Vitals/Pain Pain Assessment Pain Assessment: Faces Pain Score: 6  Faces Pain Scale: Hurts even more Pain Location: grimacing with ROM Pain Descriptors / Indicators: Grimacing, Guarding Pain Intervention(s): Limited activity within patient's tolerance,  Monitored during session, Repositioned     Extremity/Trunk Assessment Upper Extremity Assessment Upper Extremity Assessment: RUE deficits/detail;LUE deficits/detail;Difficult to assess due to impaired cognition (no command follow to LUE/RUE) RUE Deficits / Details: incr tone to elbow and shoulder, able to extend elbow ~130* passively, likely to benefit from resting hand splint once a line removed RUE Sensation: decreased light touch;decreased proprioception RUE Coordination: decreased fine motor;decreased gross motor LUE Deficits / Details: no command follow, PROM WFL LUE Sensation: decreased light touch;decreased proprioception LUE Coordination: decreased fine motor;decreased gross motor   Lower Extremity Assessment Lower Extremity Assessment: Defer to PT evaluation   Cervical / Trunk Assessment Cervical / Trunk Assessment: Other exceptions Cervical / Trunk Exceptions: poor truncal control with posterior bias   Communication Communication Communication: Impaired Factors Affecting Communication: Other (comment) (did not verbalize during session)   Cognition Arousal: Obtunded Behavior During Therapy: Flat affect                                 Following commands: Intact       Cueing  General Comments      SBP 170-200 A line   Exercises     Shoulder Instructions      Home Living Family/patient expects to be discharged to:: Private residence Living Arrangements: Spouse/significant other Available Help at Discharge: Family Type of Home: House Home Access: Stairs to enter Secretary/administrator of Steps: 4   Home Layout: Able to live on main level with bedroom/bathroom     Bathroom Shower/Tub: Chief Strategy Officer: Standard     Home Equipment: Agricultural consultant (2 wheels);Cane - single point;Wheelchair - manual   Additional Comments: son providing home info      Prior Functioning/Environment Prior Level of Function :  Independent/Modified Independent;History of Falls (last six months)             Mobility Comments: fell in the past week ADLs Comments: not driving, doesn't use AD    OT Problem List: Decreased strength;Decreased range of motion;Decreased activity tolerance;Impaired balance (sitting and/or standing);Impaired vision/perception;Decreased coordination;Decreased cognition;Decreased safety awareness;Decreased knowledge of use of DME or AE;Cardiopulmonary status limiting activity   OT Treatment/Interventions: Self-care/ADL training;Therapeutic exercise;Energy conservation;DME and/or AE instruction;Therapeutic activities;Patient/family education;Balance training      OT Goals(Current goals can be found in the care plan section)   Acute Rehab OT Goals Patient Stated Goal: unable to state OT Goal Formulation: Patient unable to participate in goal setting Time For Goal Achievement: 08/10/24 Potential to Achieve Goals: Fair ADL Goals Pt Will Perform Grooming: with min assist;bed level;sitting Pt/caregiver will Perform Home Exercise Program: Both right and left upper extremity;With written HEP provided;With minimal assist Additional ADL Goal #1: pt will follow 1 step command 50% of time in prep for ADLs Additional ADL Goal #2: pt will demo fair sitting balance in prep for ADLs Additional ADL Goal #3: pt will track to midline to improve visual scanning for ADLs   OT Frequency:  Min 2X/week    Co-evaluation PT/OT/SLP Co-Evaluation/Treatment: Yes Reason for Co-Treatment: Complexity of the patient's impairments (multi-system involvement);Necessary to address cognition/behavior during functional activity;To address functional/ADL transfers;For patient/therapist safety  AM-PAC OT 6 Clicks Daily Activity     Outcome Measure Help from another person eating meals?: Total Help from another person taking care of personal grooming?: Total Help from another person toileting, which  includes using toliet, bedpan, or urinal?: Total Help from another person bathing (including washing, rinsing, drying)?: Total Help from another person to put on and taking off regular upper body clothing?: Total Help from another person to put on and taking off regular lower body clothing?: Total 6 Click Score: 6   End of Session Nurse Communication: Mobility status (EVD clamping/unclamping, BP parameters)  Activity Tolerance: Patient tolerated treatment well Patient left: in bed;with call bell/phone within reach;with bed alarm set;with family/visitor present  OT Visit Diagnosis: Unsteadiness on feet (R26.81);Other abnormalities of gait and mobility (R26.89);Muscle weakness (generalized) (M62.81);History of falling (Z91.81)                Time: 8970-8945 OT Time Calculation (min): 25 min Charges:  OT General Charges $OT Visit: 1 Visit OT Evaluation $OT Eval Moderate Complexity: 1 Mod  Hansford Hirt K, OTD, OTR/L SecureChat Preferred Acute Rehab [phone]   Laneta POUR Koonce 07/27/2024, 12:23 PM

## 2024-07-27 NOTE — Progress Notes (Addendum)
 NAME:  Daisy [name], MRN:  [id number], DOB:  1956-09-03, LOS: 2 ADMISSION DATE:  07/25/2024, CONSULTATION DATE:  07/25/2024 REFERRING MD: Darnella - NSGY, CHIEF COMPLAINT:  Sepsis/SAH   History of Present Illness:  68 year old woman who presented to [facility] 8/31 for AMS and not feel well x 1 week. PMHx significant for HTN, COPD (not on home O2), DMT2, vitamin D deficiency, GERD, chronic back pain, EtOH abuse, polysubstance abuse.  Per report, patient was confused and feeling unwell x 1 week and was found down at home. Patient's son called 911. EMS found patient on the ground and drenched in urine with tachycardia and temp 99.59F. Patient was reportedly more confused on day of admission and had been urinating frequently and on herself and having headaches.  She drinks 2-6 beers daily and uses cocaine (last 1 week PTA).  On ED arrival, labs were notable for K 2.6, AG 17, LA 3--->5.9, WBC 17.8. Working diagnosis was sepsis and patient was started on antibiotics.  Due to confusion, CT Head performed demonstrating SAH.  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  CT chest/abd/pelvis did not reveal any acute issues. Transferred to [facility] for further care.  PCCM consulted.  Pertinent Medical History:  HTN, COPD (not on home O2), DMT2, Vitamin D deficiency, GERD, chronic back pain, EtOH abuse, polysubstance abuse   Significant Hospital Events: Including procedures, antibiotic start and stop dates in addition to other pertinent events   8/31 - Transferred to [facility] ICU from [facility] acute SAH d/t ruptured Acomm and associated Hydrocephalus 9/1 - Underwent Acomm aneurysm coiling (NSGY)  Interim History / Subjective:  Overnight, underwent Acomm aneurysm coiling with NSGY Per RN, decreased responsiveness since coiling; prior to intervention was alert and oriented to self, talking/following commands Opens eyes,  otherwise minimally responsive Spot EEG negative for seizures Echo, TCDs today pending Permissive HTN, off of Cleviprex  per NSGY Cortrak today  Objective:   Blood pressure 129/87, pulse (!) 109, temperature 99.7 F (37.6 C), temperature source Axillary, resp. rate (!) 22, height 5' 6 (1.676 m), weight 69.7 kg, SpO2 94%.        Intake/Output Summary (Last 24 hours) at 07/27/2024 0736 Last data filed at 07/27/2024 0700 Gross per 24 hour  Intake 3368.53 ml  Output 1861 ml  Net 1507.53 ml   Filed Weights   07/25/24 1553 07/26/24 0500 07/27/24 0500  Weight: 67 kg 67 kg 69.7 kg   Physical Examination: General: Acutely ill-appearing middle-aged woman in NAD. HEENT: Anicteric sclera, L pupil 6mm roung and reactive, R pupil 4mm sluggish and irregular, moist mucous membranes. S/p EVD placement with blood-tinged clear-pink fluid. Cortrak in place.  Neuro: Awake/eyes open, lethargic. Grimaces to noxious stimuli, occasional groaning. Not following commands. Flicker to pain in BLE, withdraws to pain in BUE (L > R). +Cough and +Gag, somewhat rhythmic chewing mouth motion. CV: Mildly tachycardic to low 100s, regular rhythm, no m/g/r. PULM: Breathing even and unlabored on RA. Lung fields CTAB. GI: Soft, nontender, nondistended. Normoactive bowel sounds. Extremities: No LE edema noted. Skin: Warm/dry, no rashes.  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 - Neuro/NSGY following, appreciate recommendations - Permissive HTN per NSGY (given decreased responsiveness/?vasospasm), keep SBP < 200 - AEDs per NSGY (Keppra  x 7 days) - Consider  repeat spot EEG if ongoing rhythmic mouth motions, though EEG negative for seizure 9/1PM - TCDs per protocol - Nimotop  for vasospasm ppx - Serial neuro checks - Neuroprotective measures: HOB > 30 degrees,  normoglycemia, normothermia, electrolytes WNL - Considering CTA 9/2 if no improvement in mental status  H/o ETOH use, polysubstance abuse (cocaine) and chronic pain - Monitor for signs/symptoms of withdrawal - CIWA protocol - Continue thiamine /folate - MV when able  SIRS from above vs Sepsis Lactic acidosis, improved (LA 1.4 9/1) Source not clear, WBC stable. - Trend WBC, fever curve - F/u Cx data - Continue broad-spectrum antibiotics (ceftriaxone , vanc discontinued 9/2)   Intermittent Fluid and electrolyte imbalance Hypokalemia - Trend BMP - Replete electrolytes as indicated - Monitor I&Os  HTN - Resume home antihypertensives as clinically indicated - F/u Echo  DMT2 - SSI - CBGs Q4H - Goal CBG 140-180  H/o COPD No s/sx exacerbation  - Supplemental O2 for SpO2 > 88% - Bronchodilators PRN - Pulmonary hygiene  Physical deconditioning At risk for malnutrition - PT/OT/SLP - Cortrak placed for TF initiation/VT meds  Critical care time:   The patient is critically ill with multiple organ system failure and requires high complexity decision making for assessment and support, frequent evaluation and titration of therapies, advanced monitoring, review of radiographic studies and interpretation of complex data.   Critical Care Time devoted to patient care services, exclusive of separately billable procedures, described in this note is 38 minutes.  Daisy CHRISTELLA Alesha Jaffee, PA-C [facility] 07/27/24 7:36 AM  Please see [email or web address] for pager details.  From 7A-7P if no response, please call [phone] After hours, please call ELink [phone]

## 2024-07-27 NOTE — Progress Notes (Signed)
 This morning patient noted to have unequal pupils with right pupil sluggish to nonreactive and patient minimally responsive to pain. Neurosurgery MD at the bedside. Cleviprex  turned off per MD order with plan to get CTA if mental status did not improve. Order to get CTA received and scan completed. MD aware of results. No new order at this time.

## 2024-07-27 NOTE — Progress Notes (Addendum)
   07/27/24 2250  Patient Belongings  Patient/Family advised about valuables policy? Yes  Home Medications No meds brought to hospital  Patient Belongings Kept at bedside  Belongings at Bedside Jewelry  Bedside: Jewelry Ring   Two silver color rings placed in pink denture cup with patient sticker on top and the word rings written on front of cup. Denture cup containing rings placed on computer in room. Removed d/t TEE procedure tomorrow and order stating to remove all jewelry.

## 2024-07-27 NOTE — Progress Notes (Signed)
  NEUROSURGERY PROGRESS NOTE   Pt seen and examined. No issues overnight, however patient has remained largely unresponsive after coiling. CT done overnight.  EXAM: Temp:  [98.3 F (36.8 C)-99.7 F (37.6 C)] 99.5 F (37.5 C) (09/02 0805) Pulse Rate:  [81-125] 125 (09/02 0845) Resp:  [14-26] 26 (09/02 0845) BP: (108-148)/(70-93) 135/86 (09/02 0830) SpO2:  [75 %-100 %] 96 % (09/02 0845) Arterial Line BP: (113-173)/(56-80) 160/72 (09/02 0845) Weight:  [69.7 kg] 69.7 kg (09/02 0500) Intake/Output      09/01 0701 09/02 0700 09/02 0701 09/03 0700   I.V. (mL/kg) 2735.9 (39.3) 115.9 (1.7)   NG/GT 50    IV Piggyback 582.6 90   Total Intake(mL/kg) 3368.5 (48.3) 205.9 (3)   Urine (mL/kg/hr) 1700 (1) 100 (0.7)   Emesis/NG output 0    Drains 156 5   Blood 5    Total Output 1861 105   Net +1507.5 +100.9        Urine Occurrence 2 x     Opens eyes to pain Occasionally makes unintelligible sounds Not following commands W/d to pain LUE > RUE, minimal flicker BLE Right groin site soft  LABS: Lab Results  Component Value Date   CREATININE 0.71 07/27/2024   BUN 8 07/27/2024   NA 140 07/27/2024   K 3.2 (L) 07/27/2024   CL 105 07/27/2024   CO2 22 07/27/2024   Lab Results  Component Value Date   WBC 16.7 (H) 07/27/2024   HGB 13.0 07/27/2024   HCT 36.7 07/27/2024   MCV 84.0 07/27/2024   PLT 227 07/27/2024    IMAGING: CT reviewed with stable appearance of SAH, slightly improved ventriculomegaly.  IMPRESSION: - 69 y.o. female SAH d# 2 s/p coil embolization of Acom aneurysm. Remains obtunded after coiling which did reveal significant ACA spasm. Spot EEG negative  PLAN: - Will d/c cleviprex , permissive hypertension - If no improvement in exam with increased SBP, will get CTA - Cont Nimotop      Gerldine Maizes, MD [facility]

## 2024-07-27 NOTE — Procedures (Signed)
 Cortrak  Tube Type:  Cortrak - 43 inches Tube Location:  Left nare Secured by: Bridle Initial Placement:  Gastric Technique Used to Measure Tube Placement:  Marking at nare/corner of mouth Cortrak Secured At:  65 cm   Cortrak Tube Team Note:  Consult received to place a Cortrak feeding tube.   X-ray is required. RN may begin using tube once confirmed in good position.   If the tube becomes dislodged please keep the tube and contact the Cortrak team at [email or web address] for replacement.  If after hours and replacement cannot be delayed, place a NG tube and confirm placement with an abdominal x-ray.    Augustin Shams MS, RD, LDN If unable to be reached, please send secure chat to RD inpatient available from 8:00a-4:00p daily

## 2024-07-27 NOTE — TOC CAGE-AID Note (Signed)
 Transition of Care [facility]) - CAGE-AID Screening   Patient Details  Name: Daisy [name] MRN: [id number] Date of Birth: [birth year]  [facility]) CM/SW Contact:    Areonna Bran E Emmry Hinsch, LCSW Phone Number: 07/27/2024, 11:28 AM   Clinical Narrative:    CAGE-AID Screening: Substance Abuse Screening unable to be completed due to: : Patient unable to participate

## 2024-07-27 NOTE — Progress Notes (Addendum)
 PCCM Interval Progress Note:  Notified by Cardiologist re: hyperechoic mobile density on the anterior leaflet tip of the MV which measures 0.473 cm x 0.694 cm. Cannot rule out a small vegetation, clinical correlation advised. D/w Dr. Lanis (NSGY) re: possibility of mycotic source of aneurysm; this would be unusual for this location.  Requested TEE, r/o vegetation given ongoing SIRS presentation and no clear source.  Daisy CHRISTELLA Ertha Nabor, PA-C [facility] 07/27/24 2:39 PM  Please see [email or web address] for pager details.  From 7A-7P if no response, please call [phone] After hours, please call ELink [phone]

## 2024-07-27 NOTE — Evaluation (Signed)
 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 S/p diagnostic cerebral angiogram, coil embolization of ACA aneurysm 9/1. EEG 9/2 suggestive of moderate to severe diffuse encephalopathy. PMHx: HTN, polysubstance abuse, chronic back pain, T2DM, COPD  Clinical Impression   Pt presents with LLE flaccidity, LUE hypertonicity with mobility, extensor tone with EOB, L gaze with R inattention. Pt to benefit from acute PT to address deficits. Pt requiring total +2 assist for to/from EOB, pt assisting 0% and limited by HTN and extensor tone at EOB. No command following observed, even with increased arousal at EOB. Patient will benefit from continued inpatient follow up therapy, <3 hours/day. PT to progress mobility as tolerated, and will continue to follow acutely.      SBP 170-200 via arterial line during session, RN did recalibrate during session. Manual SBPs 140s-160s during session     If plan is discharge home, recommend the following: Two people to help with walking and/or transfers;Two people to help with bathing/dressing/bathroom   Can travel by private vehicle        Equipment Recommendations None recommended by PT  Recommendations for Other Services       Functional Status Assessment Patient has had a recent decline in their functional status and/or demonstrates limited ability to make significant improvements in function in a reasonable and predictable amount of time     Precautions / Restrictions Precautions Precautions: Fall Precaution/Restrictions Comments: permissive HTN given vasospasm, no set amount but RN states <200 SBP. EVD clamped during session and RN returned at end of session to unclamp Restrictions Weight Bearing Restrictions Per Provider Order: No      Mobility  Bed  Mobility Overal bed mobility: Needs Assistance Bed Mobility: Supine to Sit, Sit to Supine     Supine to sit: +2 for physical assistance, Total assist Sit to supine: Total assist, +2 for physical assistance   General bed mobility comments: assist for all aspects, once EOB pt with eye opening but with severe posterior and extensor bias trunk and LEs, requiring return to supine for safety. Rest of session in chair position    Transfers                        Ambulation/Gait                  Stairs            Wheelchair Mobility     Tilt Bed    Modified Rankin (Stroke Patients Only) Modified Rankin (Stroke Patients Only) Pre-Morbid Rankin Score: No symptoms Modified Rankin: Severe disability     Balance Overall balance assessment: Needs assistance Sitting-balance support: Feet supported, Bilateral upper extremity supported Sitting balance-Leahy Scale: Zero Sitting balance - Comments: total +2 to maintain upright Postural control: Posterior lean                                   Pertinent Vitals/Pain Pain Assessment Pain Assessment: Faces Faces Pain Scale: Hurts even more Pain Location: grimacing with ROM Pain Descriptors / Indicators: Grimacing, Guarding Pain Intervention(s): Limited activity within patient's tolerance, Monitored during session, Repositioned    Home Living Family/patient expects to be discharged to:: Private residence Living Arrangements: Spouse/significant other Available Help  at Discharge: Family Type of Home: House Home Access: Stairs to enter   [employer] of Steps: 4   Home Layout: Able to live on main level with bedroom/bathroom Home Equipment: Agricultural consultant (2 wheels);Cane - single point;Wheelchair - manual      Prior Function Prior Level of Function : Independent/Modified Independent;History of Falls (last six months)             Mobility Comments: fell in the past week ADLs  Comments: not driving, doesn't use AD     Extremity/Trunk Assessment   Upper Extremity Assessment Upper Extremity Assessment: Defer to OT evaluation    Lower Extremity Assessment Lower Extremity Assessment: Difficult to assess due to impaired cognition (no command following, full PROM LEs with grimacing in terminal knee extension bilat)    Cervical / Trunk Assessment Cervical / Trunk Assessment: Other exceptions Cervical / Trunk Exceptions: poor truncal control with posterior bias  Communication   Communication Communication: Impaired Factors Affecting Communication: Difficulty expressing self;Other (comment) (no verbalization during session)    Cognition Arousal: Obtunded Behavior During Therapy: Flat affect   PT - Cognitive impairments: Difficult to assess Difficult to assess due to: Level of arousal                     PT - Cognition Comments: no command following even with multimodal one-step cues. Heavy L gaze with inability to reach midline with eyes or head Following commands: Intact       Cueing Cueing Techniques: Verbal cues, Gestural cues     General Comments General comments (skin integrity, edema, etc.): SBP 170-200 via arterial line during session, RN did recalibrate during session. Manual SBPs 140s-160s during session    Exercises     Assessment/Plan    PT Assessment Patient needs continued PT services  PT Problem List Decreased strength;Decreased mobility;Decreased activity tolerance;Decreased balance;Decreased knowledge of use of DME;Pain;Decreased safety awareness;Decreased knowledge of precautions;Decreased cognition;Cardiopulmonary status limiting activity;Decreased coordination;Impaired tone       PT Treatment Interventions DME instruction;Therapeutic activities;Gait training;Therapeutic exercise;Patient/family education;Balance training;Functional mobility training;Neuromuscular re-education    PT Goals (Current goals can be found in the  Care Plan section)  Acute Rehab PT Goals PT Goal Formulation: With patient/family Time For Goal Achievement: 08/10/24 Potential to Achieve Goals: Fair    Frequency Min 2X/week     Co-evaluation               AM-PAC PT 6 Clicks Mobility  Outcome Measure Help needed turning from your back to your side while in a flat bed without using bedrails?: Total Help needed moving from lying on your back to sitting on the side of a flat bed without using bedrails?: Total Help needed moving to and from a bed to a chair (including a wheelchair)?: Total Help needed standing up from a chair using your arms (e.g., wheelchair or bedside chair)?: Total Help needed to walk in hospital room?: Total Help needed climbing 3-5 steps with a railing? : Total 6 Click Score: 6    End of Session   Activity Tolerance: Patient limited by fatigue Patient left: in bed;with call bell/phone within reach;with nursing/sitter in room;with family/visitor present;Other (comment);with SCD's reapplied (bed alarm won't set due to weight too low error - RN notified and RN to address) Nurse Communication: Mobility status PT Visit Diagnosis: Other abnormalities of gait and mobility (R26.89);Muscle weakness (generalized) (M62.81)    Time: 1030-1050 PT Time Calculation (min) (ACUTE ONLY): 20 min   Charges:  PT Evaluation $PT Eval Moderate Complexity: 1 Mod   PT General Charges $$ ACUTE PT VISIT: 1 Visit         Johana RAMAN, PT DPT Acute Rehabilitation Services Secure Chat Preferred  Office [phone]   Crawford Tamura FORBES [place] 07/27/2024, 11:52 AM

## 2024-07-27 NOTE — Progress Notes (Signed)
 SLP Cancellation Note  Patient Details Name: Daisy [name] MRN: [id number] DOB: 01-May-1956   Cancelled treatment:       Reason Eval/Treat Not Completed: Medical issues which prohibited therapy. Discussed with RN, who reports pt does not follow commands and recommends holding evaluation. SLP will continue following for readiness.    Damien Blumenthal, M.A., CCC-SLP Speech Language Pathology, Acute Rehabilitation Services  Secure Chat preferred [phone]  07/27/2024, 12:52 PM

## 2024-07-27 NOTE — Progress Notes (Signed)
 Transcranial Doppler  Date POD PCO2 HCT BP  MCA ACA PCA OPHT SIPH VERT Basilar  9/2 JH 1  36.7 152/94 Right  Left   *  *   *  *   *  *   16  10   *  22   -23  -23   -31           Right  Left                                            Right  Left                                             Right  Left                                             Right  Left                                            Right  Left                                            Right  Left                                        MCA = Middle Cerebral Artery      OPHT = Opthalmic Artery     BASILAR = Basilar Artery   ACA = Anterior Cerebral Artery     SIPH = Carotid Siphon PCA = Posterior Cerebral Artery   VERT = Verterbral Artery                   Normal MCA = 62+\-12 ACA = 50+\-12 PCA = 42+\-23   Results can be found under chart review under CV PROC. 07/27/2024 6:52 PM Ashana Tullo RVT, RDMS

## 2024-07-27 NOTE — Progress Notes (Addendum)
 Pharmacy Electrolyte Replacement  Recent Labs:  Recent Labs    07/26/24 0627 07/27/24 0556  K 3.9 3.2*  MG 3.5*  --   PHOS 3.6  --   CREATININE 0.77 0.71    Low Critical Values (K </= 2.5, Phos </= 1, Mg </= 1) Present: None   Plan: Give KCL 60 mEq IV. Recheck levels with AM labs.   Sutter Ahlgren, PharmD

## 2024-07-27 NOTE — Progress Notes (Addendum)
   07/27/24 0600  Neurological  Orientation Level Disoriented X4  Cognition Unable to follow commands  Speech Mute  R Pupil Size (mm) 4  R Pupil Shape Round  R Pupil Reaction Nonreactive  L Pupil Size (mm) 4  L Pupil Shape Round  L Pupil Reaction Sluggish  RUE Motor Response Non-purposeful movement (rigidity)  RUE Motor Strength 3  LUE Motor Response Non-purposeful movement (rigitity)  LUE Motor Strength 3  RLE Motor Response Non-purposeful movement  RLE Motor Strength 2  LLE Motor Response Non-purposeful movement  LLE Motor Strength 2   On 0600 assessment R pupil nonreactive to very sluggish (different from prior) and BLE rigidity. Garst with nsgy paged. Awaiting recs.  Returned call in 5 mins. Informed of assessment and pupil change. No interventions at this time.

## 2024-07-28 ENCOUNTER — Inpatient Hospital Stay (HOSPITAL_COMMUNITY)

## 2024-07-28 ENCOUNTER — Encounter (HOSPITAL_COMMUNITY): Admission: EM | Disposition: E | Payer: Self-pay | Source: Home / Self Care | Attending: Pulmonary Disease

## 2024-07-28 ENCOUNTER — Inpatient Hospital Stay (HOSPITAL_COMMUNITY): Payer: Self-pay

## 2024-07-28 ENCOUNTER — Other Ambulatory Visit: Payer: Self-pay

## 2024-07-28 DIAGNOSIS — I609 Nontraumatic subarachnoid hemorrhage, unspecified: Secondary | ICD-10-CM

## 2024-07-28 DIAGNOSIS — R569 Unspecified convulsions: Secondary | ICD-10-CM

## 2024-07-28 DIAGNOSIS — G911 Obstructive hydrocephalus: Secondary | ICD-10-CM

## 2024-07-28 DIAGNOSIS — R931 Abnormal findings on diagnostic imaging of heart and coronary circulation: Secondary | ICD-10-CM

## 2024-07-28 DIAGNOSIS — E440 Moderate protein-calorie malnutrition: Secondary | ICD-10-CM | POA: Insufficient documentation

## 2024-07-28 LAB — BASIC METABOLIC PANEL WITH GFR
Anion gap: 12 (ref 5–15)
BUN: 8 mg/dL (ref 8–23)
CO2: 19 mmol/L — ABNORMAL LOW (ref 22–32)
Calcium: 8.3 mg/dL — ABNORMAL LOW (ref 8.9–10.3)
Chloride: 106 mmol/L (ref 98–111)
Creatinine, Ser: 0.52 mg/dL (ref 0.44–1.00)
GFR, Estimated: >60 mL/min (ref >60)
Glucose, Bld: 95 mg/dL (ref 70–99)
Potassium: 3.3 mmol/L — ABNORMAL LOW (ref 3.5–5.1)
Sodium: 137 mmol/L (ref 135–145)

## 2024-07-28 LAB — CBC
HCT: 36.3 % (ref 36.0–46.0)
Hemoglobin: 12.5 g/dL (ref 12.0–15.0)
MCH: 29.5 pg (ref 26.0–34.0)
MCHC: 34.4 g/dL (ref 30.0–36.0)
MCV: 85.6 fL (ref 80.0–100.0)
Platelets: 226 K/uL (ref 150–400)
RBC: 4.24 MIL/uL (ref 3.87–5.11)
RDW: 13.9 % (ref 11.5–15.5)
WBC: 14.2 K/uL — ABNORMAL HIGH (ref 4.0–10.5)
nRBC: 0 % (ref 0.0–0.2)

## 2024-07-28 LAB — GLUCOSE, CAPILLARY
Glucose-Capillary: 103 mg/dL — ABNORMAL HIGH (ref 70–99)
Glucose-Capillary: 112 mg/dL — ABNORMAL HIGH (ref 70–99)
Glucose-Capillary: 109 mg/dL — ABNORMAL HIGH (ref 70–99)
Glucose-Capillary: 166 mg/dL — ABNORMAL HIGH (ref 70–99)
Glucose-Capillary: 152 mg/dL — ABNORMAL HIGH (ref 70–99)

## 2024-07-28 LAB — PHOSPHORUS: Phosphorus: 2.9 mg/dL (ref 2.5–4.6)

## 2024-07-28 LAB — MAGNESIUM: Magnesium: 2.2 mg/dL (ref 1.7–2.4)

## 2024-07-28 SURGERY — TRANSESOPHAGEAL ECHOCARDIOGRAM (TEE) (CATHLAB)
Anesthesia: Monitor Anesthesia Care

## 2024-07-28 MED ORDER — POTASSIUM CHLORIDE 20 MEQ PO PACK
40.0000 meq | PACK | ORAL | Status: AC
  Administered 2024-07-28 (×2): 40 meq
  Filled 2024-07-28 (×2): qty 2

## 2024-07-28 MED ORDER — PROSOURCE TF20 ENFIT COMPATIBL EN LIQD
60.0000 mL | Freq: Two times a day (BID) | ENTERAL | Status: DC
  Administered 2024-07-28 – 2024-08-13 (×34): 60 mL
  Filled 2024-07-28 (×34): qty 60

## 2024-07-28 MED ORDER — OSMOLITE 1.5 CAL PO LIQD
1000.0000 mL | ORAL | Status: DC
  Administered 2024-07-28 – 2024-08-13 (×18): 1000 mL

## 2024-07-28 MED ORDER — SODIUM CHLORIDE 0.9 % IV SOLN: INTRAVENOUS | Status: AC

## 2024-07-28 MED ORDER — NOREPINEPHRINE 4 MG/250ML-% IV SOLN
0.0000 ug/min | INTRAVENOUS | Status: AC
  Administered 2024-07-28: 10 ug/min via INTRAVENOUS
  Administered 2024-07-28: 15 ug/min via INTRAVENOUS
  Administered 2024-07-29: 7 ug/min via INTRAVENOUS
  Administered 2024-07-29: 38 ug/min via INTRAVENOUS
  Administered 2024-07-29: 36 ug/min via INTRAVENOUS
  Administered 2024-07-29: 20 ug/min via INTRAVENOUS
  Administered 2024-07-29 – 2024-07-30 (×2): 40 ug/min via INTRAVENOUS
  Administered 2024-07-30: 10 ug/min via INTRAVENOUS
  Administered 2024-07-30: 18 ug/min via INTRAVENOUS
  Administered 2024-07-30: 20 ug/min via INTRAVENOUS
  Administered 2024-07-30: 27 ug/min via INTRAVENOUS
  Administered 2024-07-31: 30 ug/min via INTRAVENOUS
  Administered 2024-07-31: 15 ug/min via INTRAVENOUS
  Administered 2024-07-31: 25 ug/min via INTRAVENOUS
  Administered 2024-07-31 (×2): 28 ug/min via INTRAVENOUS
  Administered 2024-07-31: 23 ug/min via INTRAVENOUS
  Administered 2024-07-31: 30 ug/min via INTRAVENOUS
  Administered 2024-07-31: 20 ug/min via INTRAVENOUS
  Administered 2024-07-31 – 2024-08-01 (×2): 32 ug/min via INTRAVENOUS
  Administered 2024-08-01 (×3): 35 ug/min via INTRAVENOUS
  Administered 2024-08-01: 37 ug/min via INTRAVENOUS
  Filled 2024-07-28 (×24): qty 250

## 2024-07-28 MED ORDER — ORAL CARE MOUTH RINSE
15.0000 mL | OROMUCOSAL | Status: DC | PRN
Start: 2024-07-28 — End: 2024-08-14
  Administered 2024-08-05 – 2024-08-10 (×3): 15 mL via OROMUCOSAL

## 2024-07-28 MED ORDER — SODIUM CHLORIDE 0.9% FLUSH
10.0000 mL | Freq: Two times a day (BID) | INTRAVENOUS | Status: DC
  Administered 2024-07-28 – 2024-07-29 (×2): 10 mL
  Administered 2024-07-29 – 2024-07-30 (×3): 20 mL
  Administered 2024-07-31 (×2): 10 mL
  Administered 2024-08-01: 30 mL
  Administered 2024-08-01: 10 mL
  Administered 2024-08-02: 30 mL
  Administered 2024-08-02 – 2024-08-04 (×4): 10 mL
  Administered 2024-08-04: 20 mL
  Administered 2024-08-05 – 2024-08-09 (×8): 10 mL
  Administered 2024-08-09: 20 mL
  Administered 2024-08-10: 10 mL

## 2024-07-28 MED ORDER — ADULT MULTIVITAMIN W/MINERALS CH
1.0000 | ORAL_TABLET | Freq: Every day | ORAL | Status: DC
  Administered 2024-07-28 – 2024-08-13 (×17): 1
  Filled 2024-07-28 (×17): qty 1

## 2024-07-28 MED ORDER — VASOPRESSIN 20 UNITS/100 ML INFUSION FOR SHOCK
0.0000 [IU]/min | INTRAVENOUS | Status: DC
  Administered 2024-07-29 – 2024-08-02 (×6): 0.03 [IU]/min via INTRAVENOUS
  Administered 2024-08-02 – 2024-08-03 (×4): 0.04 [IU]/min via INTRAVENOUS
  Administered 2024-08-04: 0.02 [IU]/min via INTRAVENOUS
  Administered 2024-08-04: 0.04 [IU]/min via INTRAVENOUS
  Filled 2024-07-28 (×13): qty 100

## 2024-07-28 MED ORDER — SODIUM CHLORIDE 0.9% FLUSH: 10.0000 mL | INTRAVENOUS | Status: DC | PRN

## 2024-07-28 MED ORDER — NOREPINEPHRINE 4 MG/250ML-% IV SOLN
0.0000 ug/min | INTRAVENOUS | Status: DC
  Administered 2024-07-28: 4 ug/min via INTRAVENOUS
  Administered 2024-07-28: 10 ug/min via INTRAVENOUS
  Filled 2024-07-28 (×2): qty 250

## 2024-07-28 MED ORDER — ENOXAPARIN SODIUM 40 MG/0.4ML IJ SOSY
40.0000 mg | PREFILLED_SYRINGE | Freq: Every day | INTRAMUSCULAR | Status: DC
  Administered 2024-07-28 – 2024-08-13 (×17): 40 mg via SUBCUTANEOUS
  Filled 2024-07-28 (×17): qty 0.4

## 2024-07-28 MED ORDER — ORAL CARE MOUTH RINSE
15.0000 mL | OROMUCOSAL | Status: DC
  Administered 2024-07-29 – 2024-08-13 (×64): 15 mL via OROMUCOSAL

## 2024-07-28 MED ORDER — SODIUM CHLORIDE 0.9 % IV SOLN: 250.0000 mL | INTRAVENOUS | Status: AC

## 2024-07-28 NOTE — Progress Notes (Signed)
 [facility] ADULT ICU REPLACEMENT PROTOCOL   The patient does apply for the [facility] Adult ICU Electrolyte Replacment Protocol based on the criteria listed below:   1.Exclusion criteria: TCTS, ECMO, Dialysis, and Myasthenia Gravis patients 2. Is GFR >/= 30 ml/min? Yes.    Patient's GFR today is >60 3. Is SCr </= 2? Yes.   Patient's SCr is 0.52 mg/dL 4. Did SCr increase >/= 0.5 in 24 hours? No. 5.Pt's weight >40kg  Yes.   6. Abnormal electrolyte(s): K+ = 3.3  7. Electrolytes replaced per protocol 8.  Call MD STAT for K+ </= 2.5, Phos </= 1, or Mag </= 1 Physician:  Haze, eMD   Rosina LOISE Hamilton 07/28/2024 5:15 AM

## 2024-07-28 NOTE — Interval H&P Note (Signed)
 History and Physical Interval Note:  07/28/2024 3:04 PM  Daisy [name]  has presented today for surgery, with the diagnosis of MV mass.  The various methods of treatment have been discussed with the patient and family. After consideration of risks, benefits and other options for treatment, the patient has consented to  Procedure(s): TRANSESOPHAGEAL ECHOCARDIOGRAM (N/A) as a surgical intervention.  The patient's history has been reviewed, patient examined, no change in status, stable for surgery.  I have reviewed the patient's chart and labs.  Questions were answered to the patient's satisfaction.     [employer]

## 2024-07-28 NOTE — Progress Notes (Signed)
  NEUROSURGERY PROGRESS NOTE   Pt seen and examined. No issues overnight, however patient has remained largely unresponsive after coiling.  EXAM: Temp:  [98.5 F (36.9 C)-101.8 F (38.8 C)] 98.5 F (36.9 C) (09/03 0745) Pulse Rate:  [74-116] 116 (09/03 0930) Resp:  [15-42] 22 (09/03 0930) BP: (113-168)/(72-100) 157/94 (09/03 0900) SpO2:  [94 %-99 %] 97 % (09/03 0930) Arterial Line BP: (98-200)/(55-95) 133/66 (09/03 0930) Weight:  [69.7 kg] 69.7 kg (09/03 0500) Intake/Output      09/02 0701 09/03 0700 09/03 0701 09/04 0700   I.V. (mL/kg) 1801.6 (25.8) 202.2 (2.9)   Other 0    NG/GT 325    IV Piggyback 845    Total Intake(mL/kg) 2971.6 (42.6) 202.2 (2.9)   Urine (mL/kg/hr) 2575 (1.5)    Emesis/NG output 0    Drains 166 16   Stool 0    Blood     Total Output 2741 16   Net +230.6 +186.2        Stool Occurrence 1 x     Eyes open spontaneously Occasionally groans to central pain Not following commands W/d to pain LUE > RUE, minimal flicker BLE Right groin site soft  LABS: Lab Results  Component Value Date   CREATININE 0.52 07/28/2024   BUN 8 07/28/2024   NA 137 07/28/2024   K 3.3 (L) 07/28/2024   CL 106 07/28/2024   CO2 19 (L) 07/28/2024   Lab Results  Component Value Date   WBC 14.2 (H) 07/28/2024   HGB 12.5 07/28/2024   HCT 36.3 07/28/2024   MCV 85.6 07/28/2024   PLT 226 07/28/2024    IMAGING: CTA personally reviewed and compared to prior CTA and angiogram. Stable appearance of bilateral ACA and R>L MCA spasm. Right frontal EVD in place, no HCP.  IMPRESSION: - 68 y.o. female SAH d# 3 s/p coil embolization of Acom aneurysm. Remains obtunded after coiling which did reveal significant ACA spasm. Spot EEG negative.  - TTE reveals possible mitral vegetation  PLAN: - Cont to hold anti-hypertensives. Cont permissive hypertension up to SBP - Repeat TCD today - Cont Nimotop   - TEE today - Cont empiric abx    Gerldine Maizes, MD [facility]  [facility]

## 2024-07-28 NOTE — H&P (View-Only) (Signed)
  NEUROSURGERY PROGRESS NOTE   Pt seen and examined. No issues overnight, however patient has remained largely unresponsive after coiling.  EXAM: Temp:  [98.5 F (36.9 C)-101.8 F (38.8 C)] 98.5 F (36.9 C) (09/03 0745) Pulse Rate:  [74-116] 116 (09/03 0930) Resp:  [15-42] 22 (09/03 0930) BP: (113-168)/(72-100) 157/94 (09/03 0900) SpO2:  [94 %-99 %] 97 % (09/03 0930) Arterial Line BP: (98-200)/(55-95) 133/66 (09/03 0930) Weight:  [69.7 kg] 69.7 kg (09/03 0500) Intake/Output      09/02 0701 09/03 0700 09/03 0701 09/04 0700   I.V. (mL/kg) 1801.6 (25.8) 202.2 (2.9)   Other 0    NG/GT 325    IV Piggyback 845    Total Intake(mL/kg) 2971.6 (42.6) 202.2 (2.9)   Urine (mL/kg/hr) 2575 (1.5)    Emesis/NG output 0    Drains 166 16   Stool 0    Blood     Total Output 2741 16   Net +230.6 +186.2        Stool Occurrence 1 x     Eyes open spontaneously Occasionally groans to central pain Not following commands W/d to pain LUE > RUE, minimal flicker BLE Right groin site soft  LABS: Lab Results  Component Value Date   CREATININE 0.52 07/28/2024   BUN 8 07/28/2024   NA 137 07/28/2024   K 3.3 (L) 07/28/2024   CL 106 07/28/2024   CO2 19 (L) 07/28/2024   Lab Results  Component Value Date   WBC 14.2 (H) 07/28/2024   HGB 12.5 07/28/2024   HCT 36.3 07/28/2024   MCV 85.6 07/28/2024   PLT 226 07/28/2024    IMAGING: CTA personally reviewed and compared to prior CTA and angiogram. Stable appearance of bilateral ACA and R>L MCA spasm. Right frontal EVD in place, no HCP.  IMPRESSION: - 68 y.o. female SAH d# 3 s/p coil embolization of Acom aneurysm. Remains obtunded after coiling which did reveal significant ACA spasm. Spot EEG negative.  - TTE reveals possible mitral vegetation  PLAN: - Cont to hold anti-hypertensives. Cont permissive hypertension up to SBP - Repeat TCD today - Cont Nimotop   - TEE today - Cont empiric abx    Gerldine Maizes, MD [facility]  [facility]

## 2024-07-28 NOTE — Progress Notes (Signed)
 Initial Nutrition Assessment  DOCUMENTATION CODES:   Non-severe (moderate) malnutrition in context of social or environmental circumstances  INTERVENTION:   Initiate tube feeding via Cortrak tube: Osmolite 1.5 at 20 ml/h and increase by 10 ml every 8 hours to goal rate of 50 ml/hr (1200 ml per day)  Prosource TF20 60 ml BID  Provides 1960 kcal, 115 gm protein, 912 ml free water daily  Add MVI with minerals daily  Continue folic acid  and thiamine    Monitor magnesium  and phosphorus daily x 4 occurrences, MD to replete as needed, as pt is at risk for refeeding syndrome given pt meets criteria for malnutrition.   NUTRITION DIAGNOSIS:   Moderate Malnutrition related to social / environmental circumstances as evidenced by moderate fat depletion, severe muscle depletion.  GOAL:   Patient will meet greater than or equal to 90% of their needs  MONITOR:   TF tolerance, Labs  REASON FOR ASSESSMENT:   Consult Enteral/tube feeding initiation and management  ASSESSMENT:   Pt with PMH of COPD (no home O2), DM, Vitamin D deficiency, GERD, chronic back pain, ETOH abuse, and polysubstance abuse (cocaine). Pt was not feeling well x 1 week PTA and found down at home by son, admitted with [facility] secondary to ruptured aneurysm.   Pt discussed during ICU rounds and with RN and MD.   Per staff pt has remained unresponsive post coiling. Started pressors to allow for permissive hypertension SBP up to 200 mmHG. Plan for TEE today.   9/1 - s/p EVD placement and coiling  9/2 - s/p cortrak placement; tip distal stomach per xray    Medications reviewed and include:  1 mg folic acid  per tube daily, SSI every 4 hours, nimotop  per tube, 100 mg thiamine  daily per tube 40 mEq KCl x 3 IV NS @ 100 ml/hr  Labs reviewed:  K 3.3 Phos 2.1 -> 3.6 -> 2.9 Mag 1.5 -> 3.5 -> 2.3 -> 2.2 A1C 6.3 CBG's: 88-112  EVD: 166 ml    NUTRITION - FOCUSED PHYSICAL EXAM:  Flowsheet Row Most Recent Value   Orbital Region Moderate depletion  Upper Arm Region Severe depletion  Thoracic and Lumbar Region Moderate depletion  Buccal Region Unable to assess  Temple Region Moderate depletion  Clavicle Bone Region Moderate depletion  Clavicle and Acromion Bone Region Severe depletion  Scapular Bone Region Unable to assess  Dorsal Hand No depletion  Patellar Region Severe depletion  Anterior Thigh Region Severe depletion  Posterior Calf Region Severe depletion  Edema (RD Assessment) None  Hair Reviewed  Eyes Unable to assess  Mouth Unable to assess  Skin Reviewed  Nails Unable to assess  [artifical nails]    Diet Order:   Diet Order             Diet NPO time specified  Diet effective midnight                   EDUCATION NEEDS:   Not appropriate for education at this time  Skin:  Skin Assessment: Reviewed RN Assessment  Last BM:  9/2 large; type 6 - rectal tube placed  Height:   Ht Readings from Last 1 Encounters:  07/25/24 5' 6 (1.676 m)    Weight:   Wt Readings from Last 1 Encounters:  07/28/24 69.7 kg    BMI:  Body mass index is 24.8 kg/m.  Estimated Nutritional Needs:   Kcal:  1800-2000  Protein:  110-120 grams  Fluid:  >1.8 L/day  Khylee Algeo P.,  RD, LDN, CNSC See AMiON for contact information

## 2024-07-28 NOTE — Procedures (Signed)
 Patient Name: HANIA CERONE  MRN: [id number]  Epilepsy Attending: Arlin MALVA Krebs  Referring Physician/Provider: Ilah Corean HERO, PA-C  Date: 07/28/2024 Duration: 22.15 mins  Patient history: 68 y.o. 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 EEG to evaluate for seizure.   Level of alertness:  comatose/ lethargic    AEDs during EEG study: LEV   Technical aspects: This EEG study was done with scalp electrodes positioned according to the 10-20 International system of electrode placement. Electrical activity was reviewed with band pass filter of 1-70Hz , sensitivity of 7 uV/mm, display speed of 39mm/sec with a 60Hz  notched filter applied as appropriate. EEG data were recorded continuously and digitally stored.  Video monitoring was available and reviewed as appropriate.   Description: EEG showed continuous generalized 3 to 5 Hz theta-delta slowing. Hyperventilation and photic stimulation were not performed.      ABNORMALITY - Continuous slow, generalized   IMPRESSION: This study is suggestive of moderate to severe diffuse encephalopathy. No seizures or epileptiform discharges were seen throughout the recording.   Phoebie Shad O Denzal Meir

## 2024-07-28 NOTE — Progress Notes (Signed)
 Signed      Transcranial Doppler   Date POD PCO2 HCT BP   MCA ACA PCA OPHT SIPH VERT Basilar  9/2 JH 1   36.7 152/94 Right  Left   *  *   *  *   *  *   16  10   *  22   -23  -23   -31        9/3 JH 2    36.3  158/107  Right  Left    *   *    *   *    *   *    10   13    25   19     -19   -16    *   *              Right  Left                                                                 Right  Left                                                                 Right  Left                                                               Right  Left                                                               Right  Left                                                       MCA = Middle Cerebral Artery      OPHT = Opthalmic Artery     BASILAR = Basilar Artery   ACA = Anterior Cerebral Artery     SIPH = Carotid Siphon PCA = Posterior Cerebral Artery   VERT = Verterbral Artery                    Normal MCA = 62+\-12 ACA = 50+\-12 PCA = 42+\-23         Results can be found under chart review under CV PROC. 07/28/2024 2:32 PM Noorah Giammona RVT, RDMS

## 2024-07-28 NOTE — Progress Notes (Signed)
 Unable to perform TEE due to running tube feeds. Discussed with Dr. Corinne.  Will notify TEE scheduling team.   Oneil Parchment, MD

## 2024-07-28 NOTE — Progress Notes (Signed)
 NAME:  Daisy [name], MRN:  [id number], DOB:  1956-02-26, LOS: 3 ADMISSION DATE:  07/25/2024, CONSULTATION DATE:  07/25/2024 REFERRING MD: Darnella - NSGY, CHIEF COMPLAINT:  Sepsis/SAH   History of Present Illness:  68 year old woman who presented to [facility] 8/31 for AMS and not feel well x 1 week. PMHx significant for HTN, COPD (not on home O2), DMT2, vitamin D deficiency, GERD, chronic back pain, EtOH abuse, polysubstance abuse.  Per report, patient was confused and feeling unwell x 1 week and was found down at home. Patient's son called 911. EMS found patient on the ground and drenched in urine with tachycardia and temp 99.58F. Patient was reportedly more confused on day of admission and had been urinating frequently and on herself and having headaches.  She drinks 2-6 beers daily and uses cocaine (last 1 week PTA).  On ED arrival, labs were notable for K 2.6, AG 17, LA 3--->5.9, WBC 17.8. Working diagnosis was sepsis and patient was started on antibiotics.  Due to confusion, CT Head performed demonstrating SAH.  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  CT chest/abd/pelvis did not reveal any acute issues. Transferred to [facility] for further care.  PCCM consulted.  Pertinent Medical History:  HTN, COPD (not on home O2), DMT2, Vitamin D deficiency, GERD, chronic back pain, EtOH abuse, polysubstance abuse   Significant Hospital Events: Including procedures, antibiotic start and stop dates in addition to other pertinent events   8/31 - Transferred to [facility] ICU from [facility] acute SAH d/t ruptured Acomm and associated Hydrocephalus 9/1 - Underwent Acomm aneurysm coiling (NSGY) 9/2 Per RN, decreased responsiveness since coiling; prior to intervention was alert and oriented to self, talking/following commands. Spot EEG negative for seizures. Permissive HTN, off of Cleviprex  per NSGY 9/3 echocardiogram  yesterday revealed possible signs of vegetation pending TEE today   Interim History / Subjective:  Lying in bed minimally interactive  Objective:   Blood pressure (!) 151/92, pulse (!) 101, temperature 98.5 F (36.9 C), temperature source Oral, resp. rate (!) 21, height 5' 6 (1.676 m), weight 69.7 kg, SpO2 99%.        Intake/Output Summary (Last 24 hours) at 07/28/2024 9191 Last data filed at 07/28/2024 0700 Gross per 24 hour  Intake 2765.67 ml  Output 2636 ml  Net 129.67 ml   Filed Weights   07/26/24 0500 07/27/24 0500 07/28/24 0500  Weight: 67 kg 69.7 kg 69.7 kg   Physical Examination: General: Acute on chronic ill-appearing deconditioned middle-aged female lying in bed in no acute distress HEENT: [place]/AT, MM pink/moist, PERRL,  Neuro: Intermittently opens eyes to physical stimuli, unable to follow commands CV: s1s2 regular rate and rhythm, no murmur, rubs, or gallops,  PULM: Clear to auscultation bilaterally, no increased work of breathing, no added breath sounds GI: soft, bowel sounds active in all 4 quadrants, non-tender, non-distended, tolerating TF Extremities: warm/dry, no edema  Skin: no rashes or lesions  Resolved Problem List:   Assessment and Plan:  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 P: Neurology/neurosurgery following, appreciate assistance Permissive hypertension per neurosurgery keep SBP less than 200 Maintain neuro protective measures; goal for eurothermia, euglycemia, eunatermia, normoxia, and PCO2 goal of 35-40 Nutrition and bowel regiment  Seizure precautions  AEDs per neurology  Aspirations precautions  TCD's per protocol And Nimotop  per vascular  surgery Repeat imaging per neurology/neurosurgery  H/o ETOH use, polysubstance abuse (cocaine) and chronic pain P: Monitor for signs of of withdrawal CIWA protocol Continue thiamine ,  folate, multivitamin supplementation Seizure precautions  Concern for endocarditis - TTE 9/2 with hyperechoic mobile density on the anterior leaflet tip of the MV which measures 0.473 cm x 0.694 cm. Cannot rule out a small vegetation P: Tentative plan for TEE later today  Continue Ceftriaxone    SIRS from above vs Sepsis Lactic acidosis, improved (LA 1.4 9/1) -Source not clear, WBC stable. Blood cultures remain negative to date but there is a concern for endocarditis as above  -Vanc discontinued 9/2  P: Continue Ceftriaxone   Trend CBC and fever curve  Intermittent Fluid and electrolyte imbalance Hypokalemia P: Trend BMP Supplement as needed  HTN P: Resume home antihypertensive when appropriate, permissive hypertension currently  DMT2 P: Continue SSI CBG goal 140-180 CBG checks every 4  H/o COPD -No s/sx exacerbation  P: As needed supplemental oxygen for SpO2 goal greater than 90% As needed bronchodilator Enncourage pulmonary hygiene as able  Physical deconditioning At risk for malnutrition P: PT/OT/SLP as able Continue tube feeds via cortrak   Critical care time:  CRITICAL CARE Performed by: Anael Rosch D. Harris   Total critical care time: 38 minutes  Critical care time was exclusive of separately billable procedures and treating other patients.  Critical care was necessary to treat or prevent imminent or life-threatening deterioration.  Critical care was time spent personally by me on the following activities: development of treatment plan with patient and/or surrogate as well as nursing, discussions with consultants, evaluation of patient's response to treatment, examination of patient, obtaining history from patient or surrogate, ordering and performing treatments and interventions, ordering and review of laboratory studies, ordering and review of radiographic studies, pulse oximetry and re-evaluation of patient's condition.  Danzel Marszalek D. Harris, NP-C [facility] Personal contact information can be found on Amion  If no contact or response made please call [phone] 07/28/2024, 8:14 AM

## 2024-07-28 NOTE — Progress Notes (Signed)
 Peripherally Inserted Central Catheter Placement  The IV Nurse has discussed with the patient and/or persons authorized to consent for the patient, the purpose of this procedure and the potential benefits and risks involved with this procedure.  The benefits include less needle sticks, lab draws from the catheter, and the patient may be discharged home with the catheter. Risks include, but not limited to, infection, bleeding, blood clot (thrombus formation), and puncture of an artery; nerve damage and irregular heartbeat and possibility to perform a PICC exchange if needed/ordered by physician.  Alternatives to this procedure were also discussed.  Bard Power PICC patient education guide, fact sheet on infection prevention and patient information card has been provided to patient /or left at bedside.  Son gave telephone consent for PICC placement.  PICC Placement Documentation  PICC Double Lumen 07/28/24 Left Brachial 43 cm 1 cm (Active)  Indication for Insertion or Continuance of Line Vasoactive infusions 07/28/24 1729  Exposed Catheter (cm) 1 cm 07/28/24 1729  Site Assessment Clean, Dry, Intact 07/28/24 1729  Lumen #1 Status Flushed;Saline locked;Blood return noted 07/28/24 1729  Lumen #2 Status Flushed;Saline locked;Blood return noted 07/28/24 1729  Dressing Type Transparent;Securing device 07/28/24 1729  Dressing Status Antimicrobial disc/dressing in place 07/28/24 1729  Line Care Connections checked and tightened 07/28/24 1729  Line Adjustment (NICU/IV Team Only) No 07/28/24 1729  Dressing Intervention New dressing;Adhesive placed at insertion site (IV team only) 07/28/24 1729  Dressing Change Due 08/04/24 07/28/24 1729       Daisy  [name] 07/28/2024, 5:33 PM

## 2024-07-28 NOTE — Anesthesia Preprocedure Evaluation (Addendum)
 Anesthesia Evaluation  Patient identified by MRN, date of birth, ID band Patient unresponsive    Reviewed: Allergy & Precautions, NPO status , Patient's Chart, lab work & pertinent test results  Airway Mallampati: II  TM Distance: >3 FB Neck ROM: Full    Dental  (+) Dental Advisory Given, Edentulous Lower, Edentulous Upper   Pulmonary neg pulmonary ROS   Pulmonary exam normal breath sounds clear to auscultation       Cardiovascular hypertension, Pt. on medications Normal cardiovascular exam Rhythm:Regular Rate:Normal     Neuro/Psych 68 y.o. female SAH d# 3 s/p coil embolization of Acom aneurysm. Remains obtunded after coiling which did reveal significant ACA spasm.    GI/Hepatic Neg liver ROS,GERD  Medicated,,  Endo/Other  negative endocrine ROS    Renal/GU negative Renal ROS     Musculoskeletal negative musculoskeletal ROS (+)    Abdominal   Peds  Hematology negative hematology ROS (+)   Anesthesia Other Findings   Reproductive/Obstetrics                              Anesthesia Physical Anesthesia Plan  ASA: 4  Anesthesia Plan: MAC   Post-op Pain Management: Minimal or no pain anticipated   Induction: Intravenous  PONV Risk Score and Plan: 2 and TIVA and Treatment may vary due to age or medical condition  Airway Management Planned: Natural Airway and Simple Face Mask  Additional Equipment:   Intra-op Plan:   Post-operative Plan:   Informed Consent:   Plan Discussed with:   Anesthesia Plan Comments:          Anesthesia Quick Evaluation

## 2024-07-28 NOTE — Progress Notes (Signed)
 EEG complete - results pending

## 2024-07-29 ENCOUNTER — Encounter (HOSPITAL_COMMUNITY): Admission: EM | Disposition: E | Payer: Self-pay | Source: Home / Self Care | Attending: Pulmonary Disease

## 2024-07-29 ENCOUNTER — Other Ambulatory Visit (HOSPITAL_COMMUNITY)

## 2024-07-29 DIAGNOSIS — E878 Other disorders of electrolyte and fluid balance, not elsewhere classified: Secondary | ICD-10-CM

## 2024-07-29 DIAGNOSIS — I63322 Cerebral infarction due to thrombosis of left anterior cerebral artery: Secondary | ICD-10-CM

## 2024-07-29 DIAGNOSIS — I63532 Cerebral infarction due to unspecified occlusion or stenosis of left posterior cerebral artery: Secondary | ICD-10-CM

## 2024-07-29 DIAGNOSIS — R6510 Systemic inflammatory response syndrome (SIRS) of non-infectious origin without acute organ dysfunction: Secondary | ICD-10-CM | POA: Diagnosis not present

## 2024-07-29 DIAGNOSIS — I607 Nontraumatic subarachnoid hemorrhage from unspecified intracranial artery: Secondary | ICD-10-CM | POA: Diagnosis not present

## 2024-07-29 LAB — PHOSPHORUS: Phosphorus: 3 mg/dL (ref 2.5–4.6)

## 2024-07-29 LAB — BASIC METABOLIC PANEL WITH GFR
Anion gap: 11 (ref 5–15)
BUN: 9 mg/dL (ref 8–23)
CO2: 21 mmol/L — ABNORMAL LOW (ref 22–32)
Calcium: 8.8 mg/dL — ABNORMAL LOW (ref 8.9–10.3)
Chloride: 112 mmol/L — ABNORMAL HIGH (ref 98–111)
Creatinine, Ser: 0.59 mg/dL (ref 0.44–1.00)
GFR, Estimated: >60 mL/min (ref >60)
Glucose, Bld: 120 mg/dL — ABNORMAL HIGH (ref 70–99)
Potassium: 3.7 mmol/L (ref 3.5–5.1)
Sodium: 144 mmol/L (ref 135–145)

## 2024-07-29 LAB — GLUCOSE, CAPILLARY
Glucose-Capillary: 102 mg/dL — ABNORMAL HIGH (ref 70–99)
Glucose-Capillary: 126 mg/dL — ABNORMAL HIGH (ref 70–99)
Glucose-Capillary: 146 mg/dL — ABNORMAL HIGH (ref 70–99)
Glucose-Capillary: 173 mg/dL — ABNORMAL HIGH (ref 70–99)
Glucose-Capillary: 150 mg/dL — ABNORMAL HIGH (ref 70–99)
Glucose-Capillary: 237 mg/dL — ABNORMAL HIGH (ref 70–99)

## 2024-07-29 LAB — CBC
HCT: 37.1 % (ref 36.0–46.0)
Hemoglobin: 12.8 g/dL (ref 12.0–15.0)
MCH: 29 pg (ref 26.0–34.0)
MCHC: 34.5 g/dL (ref 30.0–36.0)
MCV: 84.1 fL (ref 80.0–100.0)
Platelets: 199 K/uL (ref 150–400)
RBC: 4.41 MIL/uL (ref 3.87–5.11)
RDW: 14.1 % (ref 11.5–15.5)
WBC: 13.1 K/uL — ABNORMAL HIGH (ref 4.0–10.5)
nRBC: 0 % (ref 0.0–0.2)

## 2024-07-29 LAB — MAGNESIUM: Magnesium: 2.2 mg/dL (ref 1.7–2.4)

## 2024-07-29 SURGERY — TRANSESOPHAGEAL ECHOCARDIOGRAM (TEE) (CATHLAB)
Anesthesia: Monitor Anesthesia Care

## 2024-07-29 MED ORDER — NIMODIPINE 30 MG PO CAPS
30.0000 mg | ORAL_CAPSULE | ORAL | Status: DC
  Filled 2024-07-29: qty 1

## 2024-07-29 MED ORDER — SODIUM CHLORIDE 0.9 % IV BOLUS
1000.0000 mL | Freq: Once | INTRAVENOUS | Status: AC
  Administered 2024-07-29: 1000 mL via INTRAVENOUS

## 2024-07-29 MED ORDER — LABETALOL HCL 5 MG/ML IV SOLN
20.0000 mg | INTRAVENOUS | Status: DC | PRN
  Administered 2024-07-29: 20 mg via INTRAVENOUS
  Filled 2024-07-29: qty 4

## 2024-07-29 MED ORDER — NUTRISOURCE FIBER PO PACK
1.0000 | PACK | Freq: Two times a day (BID) | ORAL | Status: DC
  Administered 2024-07-29 – 2024-08-08 (×21): 1
  Filled 2024-07-29 (×21): qty 1

## 2024-07-29 MED ORDER — POTASSIUM CHLORIDE 20 MEQ PO PACK
40.0000 meq | PACK | Freq: Once | ORAL | Status: AC
  Administered 2024-07-29: 40 meq
  Filled 2024-07-29: qty 2

## 2024-07-29 MED ORDER — NIMODIPINE 6 MG/ML PO SOLN
30.0000 mg | ORAL | Status: DC
  Administered 2024-07-29 – 2024-08-05 (×78): 30 mg
  Filled 2024-07-29 (×53): qty 10

## 2024-07-29 MED ORDER — NIMODIPINE 6 MG/ML PO SOLN: 60.0000 mg | ORAL | Status: DC

## 2024-07-29 MED ORDER — SODIUM CHLORIDE 0.9 % IV SOLN
2.0000 g | INTRAVENOUS | Status: AC
  Administered 2024-07-29 – 2024-07-31 (×3): 2 g via INTRAVENOUS
  Filled 2024-07-29 (×3): qty 20

## 2024-07-29 MED ORDER — NIMODIPINE 30 MG PO CAPS: 30.0000 mg | ORAL_CAPSULE | ORAL | Status: DC

## 2024-07-29 NOTE — Progress Notes (Signed)
 Occupational Therapy Treatment Patient Details Name: Daisy [name] MRN: [id number] DOB: 10-26-1956 Today's Date: 07/29/2024   History of present illness 68 yo female adm 07/25/24 after found on ground with confusion 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 S/p diagnostic cerebral angiogram, coil embolization of ACA aneurysm 9/1. EEG 9/2 suggestive of moderate to severe diffuse encephalopathy. PMHx: HTN, polysubstance abuse, chronic back pain, T2DM, COPD   OT comments  No significant changes to functional status from eval.  Largely total assist for all aspects of mobility and ADL bedlevel.  OT can continue efforts in the acute setting to address deficits and, Patient will benefit from continued inpatient follow up therapy, <3 hours/day for rehab attempt.        If plan is discharge home, recommend the following:  Two people to help with walking and/or transfers;Two people to help with bathing/dressing/bathroom;Assistance with cooking/housework;Assistance with feeding;Direct supervision/assist for medications management;Direct supervision/assist for financial management;Assist for transportation;Help with stairs or ramp for entrance;Supervision due to cognitive status   Equipment Recommendations       Recommendations for Other Services      Precautions / Restrictions Precautions Precautions: Fall Precaution/Restrictions Comments: permissive HTN given vasospasm, no set amount but RN states <200 SBP. EVD clamped during session Restrictions Weight Bearing Restrictions Per Provider Order: No       Mobility Bed Mobility Overal bed mobility: Needs Assistance Bed Mobility: Rolling Rolling: Total assist   Supine to sit: +2 for physical assistance, Total assist Sit to supine: Total assist, +2 for physical assistance        Transfers                         Balance Overall balance assessment: Needs assistance Sitting-balance support: Feet supported, Bilateral upper extremity  supported Sitting balance-Leahy Scale: Zero Sitting balance - Comments: brief periods of CGA, but never exhibited righting reactions Postural control: Posterior lean, Right lateral lean, Left lateral lean                                 ADL either performed or assessed with clinical judgement   ADL   Eating/Feeding: NPO   Grooming: Total assistance   Upper Body Bathing: Total assistance   Lower Body Bathing: Total assistance               Toileting- Clothing Manipulation and Hygiene: Total assistance              Extremity/Trunk Assessment Upper Extremity Assessment RUE Deficits / Details: no command following, PROM WFL RUE Sensation: decreased light touch;decreased proprioception RUE Coordination: decreased fine motor;decreased gross motor LUE Deficits / Details: incr tone to elbow and shoulder,likely to benefit from resting hand splint once a line removed LUE Sensation: decreased light touch;decreased proprioception LUE Coordination: decreased fine motor;decreased gross motor   Lower Extremity Assessment Lower Extremity Assessment: Defer to PT evaluation   Cervical / Trunk Assessment Cervical / Trunk Exceptions: head turn to L    Vision   Additional Comments: L gaze with L head turn.  One brief period of eye contact, never past midline   Perception Perception Perception: Not tested   Praxis Praxis Praxis: Not tested   Communication Communication Communication: Impaired Factors Affecting Communication: Difficulty expressing self   Cognition Arousal: Alert Behavior During Therapy: Flat affect Cognition: Difficult to assess Difficult to assess due to: Impaired communication  OT - Cognition Comments: No command following noted                 Following commands: Impaired Following commands impaired: Follows one step commands inconsistently      Cueing   Cueing Techniques: Verbal cues, Gestural cues, Tactile cues,  Visual cues  Exercises      Shoulder Instructions       General Comments      Pertinent Vitals/ Pain       Pain Assessment Pain Assessment: PAINAD Breathing: occasional labored breathing, short period of hyperventilation Negative Vocalization: occasional moan/groan, low speech, negative/disapproving quality Facial Expression: smiling or inexpressive Body Language: relaxed Consolability: no need to console PAINAD Score: 2 Pain Location: grimacing with ROM Pain Intervention(s): Monitored during session                                                          Frequency  Min 2X/week        Progress Toward Goals  OT Goals(current goals can now be found in the care plan section)  Progress towards OT goals: Not progressing toward goals - comment  Acute Rehab OT Goals OT Goal Formulation: Patient unable to participate in goal setting Time For Goal Achievement: 08/10/24 Potential to Achieve Goals: Fair  Plan      Co-evaluation    PT/OT/SLP Co-Evaluation/Treatment: Yes Reason for Co-Treatment: Complexity of the patient's impairments (multi-system involvement);Necessary to address cognition/behavior during functional activity;To address functional/ADL transfers;For patient/therapist safety   OT goals addressed during session: ADL's and self-care      AM-PAC OT 6 Clicks Daily Activity     Outcome Measure   Help from another person eating meals?: Total Help from another person taking care of personal grooming?: Total Help from another person toileting, which includes using toliet, bedpan, or urinal?: Total Help from another person bathing (including washing, rinsing, drying)?: Total Help from another person to put on and taking off regular upper body clothing?: Total Help from another person to put on and taking off regular lower body clothing?: Total 6 Click Score: 6    End of Session    OT Visit Diagnosis: Unsteadiness on feet  (R26.81);Other abnormalities of gait and mobility (R26.89);Muscle weakness (generalized) (M62.81);History of falling (Z91.81)   Activity Tolerance     Patient Left in bed;with nursing/sitter in room   Nurse Communication          Time: 8988-8954 OT Time Calculation (min): 34 min  Charges: OT General Charges $OT Visit: 1 Visit OT Treatments $Therapeutic Activity: 8-22 mins  07/29/2024  RP, OTR/L  Acute Rehabilitation Services  Office:  [phone]   Daisy [name] 07/29/2024, 11:01 AM

## 2024-07-29 NOTE — Progress Notes (Addendum)
 NAME:  ASHAUNTI TREPTOW, MRN:  [id number], DOB:  28-Oct-1956, LOS: 4 ADMISSION DATE:  07/25/2024, CONSULTATION DATE:  07/25/2024 REFERRING MD: Darnella - NSGY, CHIEF COMPLAINT:  Sepsis/SAH   History of Present Illness:  68 year old woman who presented to [facility] 8/31 for AMS and not feel well x 1 week. PMHx significant for HTN, COPD (not on home O2), DMT2, vitamin D deficiency, GERD, chronic back pain, EtOH abuse, polysubstance abuse.  Per report, patient was confused and feeling unwell x 1 week and was found down at home. Patient's son called 911. EMS found patient on the ground and drenched in urine with tachycardia and temp 99.75F. Patient was reportedly more confused on day of admission and had been urinating frequently and on herself and having headaches.  She drinks 2-6 beers daily and uses cocaine (last 1 week PTA).  On ED arrival, labs were notable for K 2.6, AG 17, LA 3--->5.9, WBC 17.8. Working diagnosis was sepsis and patient was started on antibiotics.  Due to confusion, CT Head performed demonstrating SAH.  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  CT chest/abd/pelvis did not reveal any acute issues. Transferred to [facility] for further care.  PCCM consulted.  Pertinent Medical History:  HTN, COPD (not on home O2), DMT2, Vitamin D deficiency, GERD, chronic back pain, EtOH abuse, polysubstance abuse   Significant Hospital Events: Including procedures, antibiotic start and stop dates in addition to other pertinent events   8/31 - Transferred to [facility] ICU from [facility] acute SAH d/t ruptured Acomm and associated Hydrocephalus 9/1 - Underwent Acomm aneurysm coiling (NSGY) 9/2 Per RN, decreased responsiveness since coiling; prior to intervention was alert and oriented to self, talking/following commands. Spot EEG negative for seizures. Permissive HTN, off of Cleviprex  per NSGY.  Echocardiogram showed  hyperechoic mobile density in the anterior left hip of the mitral valve.  LVEF 60 to 65% normal RV function. Mental status declined on 9/1 with worsening lethargy no longer following commands.  CT was initially negative for change, EEG has been negative for seizure.  BP parameters changed to make goal 180-200 for permissive hypertension on 9/2.  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  Mental status continues to be poor.  Cultures still negative.  Interim History / Subjective:  No significant change  Objective:   Blood pressure (!) 156/108, pulse (!) 207, temperature 98.5 F (36.9 C), temperature source Axillary, resp. rate (!) 21, height 5' 6 (1.676 m), weight 69.7 kg, SpO2 95%.        Intake/Output Summary (Last 24 hours) at 07/29/2024 0829 Last data filed at 07/29/2024 0700 Gross per 24 hour  Intake 2023.13 ml  Output 3532 ml  Net -1508.87 ml   Filed Weights   07/26/24 0500 07/27/24 0500 07/28/24 0500  Weight: 67 kg 69.7 kg 69.7 kg   Physical Examination: General 68 year old female patient lying in bed no distress, but exam is much declined from when I saw her on 9/1 HEENT normal cephalic atraumatic she has an EVD draining from the right side of head, pink-tinged CSF drainage appreciated, staples intact at surgical site Pulmonary  some occasional wheezing, no accessory use on room air Cardiac regular rate and rhythm Extremities warm dry brisk capillary refill, she has mild increased swelling of the left upper extremity in comparison to the right.  PICC line has been placed on that side on 9/3. Abdomen soft nontender Neuro she is awake, not verbal, will make  unintelligible sounds with painful stimulation.  She has a left sided gaze preference, got flicker response to nailbed pressure on the left lower extremity, no response on the right lower extremity.  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  TCD's have been limited quality as of 9/3 no vasospasm appreciated, however CT angiogram suggesting progressive vasospasm of of the anterior circulation Plan neurosurgery following, appreciate assistance Permissive hypertension per neurosurgery: Goal blood pressure 180-200, has as needed labetalol  for blood pressure greater than 200 and pressors for systolic blood pressure less than 180 Continue serial neuroexams Maintain neuro protective measures; goal for eurothermia, euglycemia, eunatermia, normoxia, and PCO2 goal of 35-40 Nutrition and bowel regiment  Seizure precautions  Completing 7 days of prophylactic anticonvulsants LTM Aspirations precautions  TCD's per protocol Continuing Nimotop   Repeat imaging per neurology/neurosurgery Suspect the infarct is related to her vasospasm, septic emboli seems less likely.    h/o ETOH use, polysubstance abuse (cocaine) and chronic pain  Plan Monitor for signs of of withdrawal CIWA protocol Continue thiamine , folate, multivitamin supplementation Seizure precautions  Concern for endocarditis - TTE 9/2 with hyperechoic mobile density on the anterior leaflet tip of the MV which measures 0.473  cm x 0.694 cm. Cannot rule out a small vegetation.  So far cultures have been negative Plan TEE pending when she is more stable  Day number 5 ceftriaxone , vancomycin  was discontinued on 9/2 Tentative plan for TEE later today  Continue Ceftriaxone    SIRS from above vs Sepsis, still having fever -Source not clear, culture data remains negative, white cells improving-Vanc discontinued 9/2  Plan Continue Ceftriaxone   Trend CBC and fever curve Check procalcitonin Consider culture CSF from EVD, if fever persists  Intermittent Fluid and electrolyte imbalance: Mild hyperchloremia Plan Trend BMP Supplement as needed  History of HTN Plan Resume home antihypertensive when appropriate, permissive hypertension currently  DMT2 P: Continue SSI, every 4 hours CBG goal 140-180  H/o COPD -No s/sx exacerbation  Plan As needed supplemental oxygen for SpO2 goal greater than 90% As needed bronchodilator Enncourage pulmonary hygiene as able  Physical deconditioning At risk for malnutrition Plan PT/OT/SLP as able Continue tube feeds via cortrak   Critical care time: 45 minutes  CRITICAL CARE Performed by: Jeralyn FORBES Banner

## 2024-07-29 NOTE — Progress Notes (Addendum)
 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 S/p diagnostic cerebral angiogram, coil embolization of ACA aneurysm 9/1. EEG 9/2 suggestive of moderate to severe diffuse encephalopathy. PMHx: HTN, polysubstance abuse, chronic back pain, T2DM, COPD    PT Comments  Pt not progressing towards her physical therapy goals. Pt blinks to threat and continues with forced left gaze and only visually fixates x 2 to therapist. Pt with no command following; verbalizes ow, with bed mobility. Pt dependent for bed mobility and sitting balance. Worked on sitting balance and cervical stretching. Patient will benefit from continued inpatient follow up therapy, <3 hours/day.   If plan is discharge home, recommend the following: Two people to help with walking and/or transfers;Two people to help with bathing/dressing/bathroom   Can travel by private vehicle     No  Equipment Recommendations  Hoyer lift;Hospital bed    Recommendations for Other Services       Precautions / Restrictions Precautions Precautions: Fall Precaution/Restrictions Comments: permissive HTN given vasospasm, no set amount but RN states <200 SBP. EVD clamped during session. Rectal pouch Restrictions Weight Bearing Restrictions Per Provider Order: No     Mobility  Bed Mobility Overal bed mobility: Needs Assistance Bed Mobility: Rolling, Supine to Sit, Sit to Supine Rolling: Total assist   Supine to sit: Total assist, +2 for physical assistance Sit to supine: Total assist, +2 for physical assistance        Transfers                   General transfer comment: deferred    Ambulation/Gait                   Stairs             Wheelchair Mobility     Tilt Bed    Modified Rankin (Stroke Patients  Only) Modified Rankin (Stroke Patients Only) Pre-Morbid Rankin Score: No symptoms Modified Rankin: Severe disability     Balance Overall balance assessment: Needs assistance Sitting-balance support: Feet supported, Bilateral upper extremity supported Sitting balance-Leahy Scale: Zero Sitting balance - Comments: brief periods of CGA, but never exhibited righting reactions Postural control: Posterior lean, Right lateral lean, Left lateral lean                                  Communication Communication Communication: Impaired Factors Affecting Communication: Difficulty expressing self (no verbalizations)  Cognition Arousal: Alert Behavior During Therapy: Flat affect   PT - Cognitive impairments: Difficult to assess                       PT - Cognition Comments: No command following, visually fixating x 2 during session, forced left gaze Following commands: Impaired Following commands impaired:  (no command following)    Cueing Cueing Techniques: Gestural cues, Verbal cues, Tactile cues, Visual cues  Exercises General Exercises - Lower Extremity Ankle Circles/Pumps: PROM, Both, 10 reps, Supine Heel Slides: PROM, Both, 10 reps, Supine Other Exercises Other Exercises: Sitting: cervical stretching into midline and right cervical rotation, trunk rotation to R/L    General Comments        Pertinent Vitals/Pain Pain Assessment Pain Assessment: PAINAD Breathing: occasional labored breathing, short period of  hyperventilation Negative Vocalization: occasional moan/groan, low speech, negative/disapproving quality Facial Expression: smiling or inexpressive Body Language: relaxed Consolability: no need to console PAINAD Score: 2    Home Living                          Prior Function            PT Goals (current goals can now be found in the care plan section) Acute Rehab PT Goals Potential to Achieve Goals: Poor Progress towards PT goals:  Not progressing toward goals - comment    Frequency    Min 1X/week      PT Plan      Co-evaluation PT/OT/SLP Co-Evaluation/Treatment: Yes Reason for Co-Treatment: Complexity of the patient's impairments (multi-system involvement);Necessary to address cognition/behavior during functional activity;To address functional/ADL transfers;For patient/therapist safety PT goals addressed during session: Mobility/safety with mobility;Balance OT goals addressed during session: ADL's and self-care      AM-PAC PT 6 Clicks Mobility   Outcome Measure  Help needed turning from your back to your side while in a flat bed without using bedrails?: Total Help needed moving from lying on your back to sitting on the side of a flat bed without using bedrails?: Total Help needed moving to and from a bed to a chair (including a wheelchair)?: Total Help needed standing up from a chair using your arms (e.g., wheelchair or bedside chair)?: Total Help needed to walk in hospital room?: Total Help needed climbing 3-5 steps with a railing? : Total 6 Click Score: 6    End of Session   Activity Tolerance: Other (comment) (limited by cognition) Patient left: in bed;with call bell/phone within reach;with nursing/sitter in room Nurse Communication: Mobility status PT Visit Diagnosis: Other abnormalities of gait and mobility (R26.89);Muscle weakness (generalized) (M62.81)     Time: 1009-1050 PT Time Calculation (min) (ACUTE ONLY): 41 min  Charges:    $Therapeutic Activity: 8-22 mins PT General Charges $$ ACUTE PT VISIT: 1 Visit                     Aleck [name], PT, DPT Acute Rehabilitation Services Office [phone]    Daisy [name] 07/29/2024, 12:19 PM

## 2024-07-29 NOTE — Progress Notes (Signed)
 Pharmacy Electrolyte Replacement  Recent Labs:  Recent Labs    07/29/24 0416  K 3.7  MG 2.2  PHOS 3.0  CREATININE 0.59    Low Critical Values (K </= 2.5, Phos </= 1, Mg </= 1) Present: None  Plan: Give KCL 40 meq per tube once. Recheck levels with AM labs.   Cleopha Indelicato, PharmD

## 2024-07-29 NOTE — TOC Progression Note (Signed)
 Transition of Care [facility]) - Progression Note    Patient Details  Name: Daisy [name] MRN: [id number] Date of Birth: Jul 08, 1956  Transition of Care [facility]) CM/SW Contact  Inocente GORMAN Kindle, LCSW Phone Number: 07/29/2024, 8:08 AM  Clinical Narrative:    CSW continuing to follow for needs.    Expected Discharge Plan: Skilled Nursing Facility Barriers to Discharge: Continued Medical Work up, English as a second language teacher, SNF Pending bed offer               Expected Discharge Plan and Services In-house Referral: Clinical Social Work     Living arrangements for the past 2 months: Single Family Home                                       Social Drivers of Health (SDOH) Interventions SDOH Screenings   Tobacco Use: Low Risk  (07/26/2024)    Readmission Risk Interventions     No data to display

## 2024-07-29 NOTE — Progress Notes (Signed)
  NEUROSURGERY PROGRESS NOTE   Pt seen and examined. No issues overnight  EXAM: Temp:  [97.6 F (36.4 C)-101 F (38.3 C)] 99 F (37.2 C) (09/04 1200) Pulse Rate:  [89-207] 207 (09/04 0700) Resp:  [19-34] 21 (09/04 0700) BP: (136-184)/(77-120) 156/108 (09/04 0700) SpO2:  [95 %-100 %] 95 % (09/04 0700) Arterial Line BP: (119-224)/(57-104) 194/94 (09/04 0700) Weight:  [69.6 kg] 69.6 kg (09/04 0704) Intake/Output      09/03 0701 09/04 0700 09/04 0701 09/05 0700   I.V. (mL/kg) 1782.2 (25.6)    Other     NG/GT 243.2    IV Piggyback 100    Total Intake(mL/kg) 2125.3 (30.5)    Urine (mL/kg/hr) 2250 (1.3)    Emesis/NG output     Drains 139    Stool 1150    Total Output 3539    Net -1413.7         Urine Occurrence 1 x     Eyes open spontaneously Occasionally groans to central pain Not following commands W/d to pain LUE > RUE, minimal flicker BLE Right groin site soft EVD in place, patent  LABS: Lab Results  Component Value Date   CREATININE 0.59 07/29/2024   BUN 9 07/29/2024   NA 144 07/29/2024   K 3.7 07/29/2024   CL 112 (H) 07/29/2024   CO2 21 (L) 07/29/2024   Lab Results  Component Value Date   WBC 13.1 (H) 07/29/2024   HGB 12.8 07/29/2024   HCT 37.1 07/29/2024   MCV 84.1 07/29/2024   PLT 199 07/29/2024    IMAGING: CTA personally reviewed and compared to prior CTA and angiogram. Stable appearance of bilateral ACA and R>L MCA spasm. Right frontal EVD in place, no HCP.  IMPRESSION: - 68 y.o. female SAH d# 4 s/p coil embolization of Acom aneurysm. Remains obtunded after coiling which did reveal significant ACA spasm. Spot EEG negative. Left ACA territory stroke - TTE reveals possible mitral vegetation, no plan for further eval currently.  PLAN: - Cont to hold anti-hypertensives. Can augment with single pressor although it doesn't seem to improve her clinical exam. Therefore don't think addition of multiple pressors is likely to be of much benefit. - Cont  Nimotop   - Cont empiric abx    Gerldine Maizes, MD [facility]

## 2024-07-29 NOTE — Progress Notes (Signed)
 Brief cardiology note:  I was contacted to review the patient to see if she is appropriate for TEE today. Based on review of her chart, she still has an arterial line and ventriculostomy drain in place, with recommendation for permissive hypertension. She is also noted to be nonresponsive/lethargic.   I personally reviewed her echo images. There is concern for a small vegetation on her mitral valve leaflet. TEE would be to determine whether she needs CT surgery (if mass is large) or extended course of antibiotics.   Based on measurements on the TTE, it does not appear that she would meet criteria for surgery; however, even if mass was measured larger on other imaging modalities, she is not a candidate for open heart surgery at this time given her clinical status. For the other indication, the decision would be whether she needs an extended course of antibiotics. She has not had positive blood cultures that I can see. This would be a decision made closer to discharge.  Given her current clinical status, I recommend performing TEE her once she is closer to discharge and more stable.   Also, there is a very good chance we will pull out the NG tube with the TEE probe, so either waiting until NG tube is removed or being prepared to replace tube would be important.  We will cancel TEE for today. Recommend once patient is more clinically stable/approaching discharge, we would be happy to re-evaluate for TEE.  Shelda Bruckner, MD, PhD, [facility]  [facility]  [place]  [facility] & Vascular at [facility] at [facility] [address] [place], [place] [address] [phone]

## 2024-07-30 ENCOUNTER — Inpatient Hospital Stay (HOSPITAL_COMMUNITY)

## 2024-07-30 DIAGNOSIS — E8721 Acute metabolic acidosis: Secondary | ICD-10-CM

## 2024-07-30 DIAGNOSIS — I63322 Cerebral infarction due to thrombosis of left anterior cerebral artery: Secondary | ICD-10-CM

## 2024-07-30 DIAGNOSIS — I63532 Cerebral infarction due to unspecified occlusion or stenosis of left posterior cerebral artery: Secondary | ICD-10-CM

## 2024-07-30 DIAGNOSIS — R931 Abnormal findings on diagnostic imaging of heart and coronary circulation: Secondary | ICD-10-CM

## 2024-07-30 DIAGNOSIS — I602 Nontraumatic subarachnoid hemorrhage from anterior communicating artery: Principal | ICD-10-CM

## 2024-07-30 DIAGNOSIS — I609 Nontraumatic subarachnoid hemorrhage, unspecified: Secondary | ICD-10-CM

## 2024-07-30 DIAGNOSIS — G911 Obstructive hydrocephalus: Secondary | ICD-10-CM

## 2024-07-30 LAB — GLUCOSE, CAPILLARY
Glucose-Capillary: 177 mg/dL — ABNORMAL HIGH (ref 70–99)
Glucose-Capillary: 104 mg/dL — ABNORMAL HIGH (ref 70–99)
Glucose-Capillary: 115 mg/dL — ABNORMAL HIGH (ref 70–99)
Glucose-Capillary: 133 mg/dL — ABNORMAL HIGH (ref 70–99)
Glucose-Capillary: 196 mg/dL — ABNORMAL HIGH (ref 70–99)

## 2024-07-30 LAB — CULTURE, BLOOD (ROUTINE X 2)
Culture: NO GROWTH
Culture: NO GROWTH
Special Requests: ADEQUATE

## 2024-07-30 LAB — RENAL FUNCTION PANEL
Albumin: 2.9 g/dL — ABNORMAL LOW (ref 3.5–5.0)
Anion gap: 9 (ref 5–15)
BUN: 9 mg/dL (ref 8–23)
CO2: 19 mmol/L — ABNORMAL LOW (ref 22–32)
Calcium: 8.5 mg/dL — ABNORMAL LOW (ref 8.9–10.3)
Chloride: 112 mmol/L — ABNORMAL HIGH (ref 98–111)
Creatinine, Ser: 0.54 mg/dL (ref 0.44–1.00)
GFR, Estimated: >60 mL/min (ref >60)
Glucose, Bld: 180 mg/dL — ABNORMAL HIGH (ref 70–99)
Phosphorus: 3.1 mg/dL (ref 2.5–4.6)
Potassium: 3.4 mmol/L — ABNORMAL LOW (ref 3.5–5.1)
Sodium: 140 mmol/L (ref 135–145)

## 2024-07-30 LAB — MAGNESIUM: Magnesium: 2 mg/dL (ref 1.7–2.4)

## 2024-07-30 LAB — CBC
HCT: 34.4 % — ABNORMAL LOW (ref 36.0–46.0)
Hemoglobin: 11.8 g/dL — ABNORMAL LOW (ref 12.0–15.0)
MCH: 29.8 pg (ref 26.0–34.0)
MCHC: 34.3 g/dL (ref 30.0–36.0)
MCV: 86.9 fL (ref 80.0–100.0)
Platelets: 181 K/uL (ref 150–400)
RBC: 3.96 MIL/uL (ref 3.87–5.11)
RDW: 14.5 % (ref 11.5–15.5)
WBC: 18.5 K/uL — ABNORMAL HIGH (ref 4.0–10.5)
nRBC: 0 % (ref 0.0–0.2)

## 2024-07-30 LAB — PROCALCITONIN: Procalcitonin: 0.14 ng/mL

## 2024-07-30 LAB — PHOSPHORUS: Phosphorus: 3 mg/dL (ref 2.5–4.6)

## 2024-07-30 MED ORDER — LACTATED RINGERS IV BOLUS
1000.0000 mL | Freq: Once | INTRAVENOUS | Status: AC
  Administered 2024-07-30: 1000 mL via INTRAVENOUS

## 2024-07-30 MED ORDER — IPRATROPIUM-ALBUTEROL 0.5-2.5 (3) MG/3ML IN SOLN
3.0000 mL | RESPIRATORY_TRACT | Status: DC
  Administered 2024-07-30 (×2): 3 mL via RESPIRATORY_TRACT
  Filled 2024-07-30 (×3): qty 3

## 2024-07-30 MED ORDER — METHYLPREDNISOLONE SODIUM SUCC 40 MG IJ SOLR
40.0000 mg | Freq: Every day | INTRAMUSCULAR | Status: DC
  Administered 2024-07-31: 40 mg via INTRAVENOUS
  Filled 2024-07-30: qty 1

## 2024-07-30 MED ORDER — POTASSIUM CHLORIDE 20 MEQ PO PACK
40.0000 meq | PACK | Freq: Once | ORAL | Status: AC
  Administered 2024-07-30: 40 meq
  Filled 2024-07-30: qty 2

## 2024-07-30 MED ORDER — IPRATROPIUM-ALBUTEROL 0.5-2.5 (3) MG/3ML IN SOLN
3.0000 mL | Freq: Four times a day (QID) | RESPIRATORY_TRACT | Status: DC
  Administered 2024-07-31 (×3): 3 mL via RESPIRATORY_TRACT
  Filled 2024-07-30 (×3): qty 3

## 2024-07-30 MED ORDER — METHYLPREDNISOLONE SODIUM SUCC 40 MG IJ SOLR
40.0000 mg | Freq: Two times a day (BID) | INTRAMUSCULAR | Status: DC
  Administered 2024-07-30: 40 mg via INTRAVENOUS
  Filled 2024-07-30: qty 1

## 2024-07-30 NOTE — Progress Notes (Signed)
  NEUROSURGERY PROGRESS NOTE   Pt seen and examined. No issues overnight  EXAM: Temp:  [97.7 F (36.5 C)-101.8 F (38.8 C)] 97.8 F (36.6 C) (09/05 0800) Pulse Rate:  [93-125] 119 (09/05 0800) Resp:  [18-31] 23 (09/05 0800) BP: (139-182)/(88-121) 178/102 (09/05 0800) SpO2:  [96 %-100 %] 100 % (09/05 0800) Arterial Line BP: (150-217)/(73-102) 196/89 (09/05 0800) Intake/Output      09/04 0701 09/05 0700 09/05 0701 09/06 0700   I.V. (mL/kg) 1900.2 (27.3) 35.3 (0.5)   NG/GT 905 50   IV Piggyback 349.4    Total Intake(mL/kg) 3154.6 (45.3) 85.3 (1.2)   Urine (mL/kg/hr) 2450 (1.5)    Drains 129 5   Stool 700    Total Output 3279 5   Net -124.4 +80.3         Eyes open spontaneously Occasionally tracks Occasionally groans to central pain Not following commands W/d to pain LUE > RUE, minimal flicker BLE Right groin site soft EVD in place, patent  LABS: Lab Results  Component Value Date   CREATININE 0.54 07/30/2024   BUN 9 07/30/2024   NA 140 07/30/2024   K 3.4 (L) 07/30/2024   CL 112 (H) 07/30/2024   CO2 19 (L) 07/30/2024   Lab Results  Component Value Date   WBC 13.1 (H) 07/29/2024   HGB 12.8 07/29/2024   HCT 37.1 07/29/2024   MCV 84.1 07/29/2024   PLT 199 07/29/2024     IMPRESSION: - 68 y.o. female SAH d# 5 s/p coil embolization of Acom aneurysm. Remains obtunded after coiling which did reveal significant ACA spasm. Spot EEG negative. 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  - TTE reveals possible mitral vegetation, no plan for further eval currently.  PLAN: - Cont with hyperdynamic treatment for goal SBP 180-277mmHg. - Cont Nimotop  split Q2 - Cont empiric abx per PCCM - Will order MRI brain w/o contrast, wonder if pt has had more diffuse embolic or potentially watershed stroke that might explain her overall obtundation.    Gerldine Maizes, MD [facility]

## 2024-07-30 NOTE — Progress Notes (Signed)
 [facility] ADULT ICU REPLACEMENT PROTOCOL   The patient does apply for the [facility] Adult ICU Electrolyte Replacment Protocol based on the criteria listed below:   1.Exclusion criteria: TCTS, ECMO, Dialysis, and Myasthenia Gravis patients 2. Is GFR >/= 30 ml/min? Yes.    Patient's GFR today is >60 3. Is SCr </= 2? Yes.   Patient's SCr is 0.54 mg/dL 4. Did SCr increase >/= 0.5 in 24 hours? No. 5.Pt's weight >40kg  Yes.   6. Abnormal electrolyte(s):   K 3.4  7. Electrolytes replaced per protocol 8.  Call MD STAT for K+ </= 2.5, Phos </= 1, or Mag </= 1 Physician:  A. Paliwal  Daisy [name] 07/30/2024 6:06 AM

## 2024-07-30 NOTE — Progress Notes (Signed)
 SLP Cancellation Note  Patient Details Name: Daisy [name] MRN: [id number] DOB: 18-Jul-1956   Cancelled treatment:       Reason Eval/Treat Not Completed: Fatigue/lethargy limiting ability to participate;Patient's level of consciousness RN reports poor alertness and only responds to painful stimuli. Not appropriate for SLE. Will continue efforts.    Dustin Olam Bull 07/30/2024, 11:45 AM

## 2024-07-30 NOTE — Progress Notes (Signed)
 NAME:  JANARIA MCCAMMON, MRN:  [id number], DOB:  November 19, 1956, LOS: 5 ADMISSION DATE:  07/25/2024, CONSULTATION DATE:  07/25/2024 REFERRING MD: Darnella - NSGY, CHIEF COMPLAINT:  Sepsis/SAH   History of Present Illness:  68 year old woman who presented to [facility] 8/31 for AMS and not feel well x 1 week. PMHx significant for HTN, COPD (not on home O2), DMT2, vitamin D deficiency, GERD, chronic back pain, EtOH abuse, polysubstance abuse.  Per report, patient was confused and feeling unwell x 1 week and was found down at home. Patient's son called 911. EMS found patient on the ground and drenched in urine with tachycardia and temp 99.77F. Patient was reportedly more confused on day of admission and had been urinating frequently and on herself and having headaches.  She drinks 2-6 beers daily and uses cocaine (last 1 week PTA).  On ED arrival, labs were notable for K 2.6, AG 17, LA 3--->5.9, WBC 17.8. Working diagnosis was sepsis and patient was started on antibiotics.  Due to confusion, CT Head performed demonstrating SAH.  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  CT chest/abd/pelvis did not reveal any acute issues. Transferred to [facility] for further care.  PCCM consulted.  Pertinent Medical History:  HTN, COPD (not on home O2), DMT2, Vitamin D deficiency, GERD, chronic back pain, EtOH abuse, polysubstance abuse   Significant Hospital Events: Including procedures, antibiotic start and stop dates in addition to other pertinent events   8/31 - Transferred to [facility] ICU from [facility] acute SAH d/t ruptured Acomm and associated Hydrocephalus 9/1 - Underwent Acomm aneurysm coiling (NSGY) 9/2 Per RN, decreased responsiveness since coiling; prior to intervention was alert and oriented to self, talking/following commands. Spot EEG negative for seizures. Permissive HTN, off of Cleviprex  per NSGY.  Echocardiogram showed  hyperechoic mobile density in the anterior left hip of the mitral valve.  LVEF 60 to 65% normal RV function. Mental status declined on 9/1 with worsening lethargy no longer following commands.  CT was initially negative for change, EEG has been negative for seizure.  BP parameters changed to make goal 180-200 for permissive hypertension on 9/2.  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  Mental status continues to be poor.  Cultures still negative.  Interim History / Subjective:  No significant change  Objective:   Blood pressure (!) 178/102, pulse (!) 119, temperature 97.8 F (36.6 C), temperature source Axillary, resp. rate (!) 23, height 5' 6 (1.676 m), weight 69.6 kg, SpO2 100%.        Intake/Output Summary (Last 24 hours) at 07/30/2024 1022 Last data filed at 07/30/2024 0800 Gross per 24 hour  Intake 3239.81 ml  Output 3269 ml  Net -29.19 ml   Filed Weights   07/27/24 0500 07/28/24 0500 07/29/24 0704  Weight: 69.7 kg 69.7 kg 69.6 kg   Physical Examination: General This is a critically ill 68 year old female patient she remains minimally responsive here in the intensive care HEENT normocephalic, has her EVD placed on the right side.  Still draining pink appearing CSF set at 15 cm above the tragus Neuro awakens to painful stimulus,  will groan and make incomprehensible noises, flickers on the left, I did see decerebrate posturing on the right today flicker response to the lowers, pupils equal reactive has left gaze preference Pulmonary some occasional wheezing no accessory use on room air Cardiac regular rate and rhythm Abdomen soft nontender GU clear  yellow.   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  TCD's have been limited quality as of 9/3 no vasospasm appreciated, however CT angiogram suggesting progressive vasospasm of of the anterior circulation Remains on norepinephrine  and vasopressin  Plan neurosurgery following, appreciate assistance Permissive hypertension per neurosurgery: Goal blood pressure 180-200  Increased vasopressin  ceiling, spaced out her nimodipine  dosing Continue serial neuroexams Maintain neuro protective measures; goal for eurothermia, euglycemia, eunatermia, normoxia, and PCO2 goal of 35-40 Nutrition and bowel regiment  Seizure precautions: Completing 7 days of prophylactic anticonvulsants Aspirations precautions  TCD's per protocol (although not been very helpful) Continuing Nimotop   Repeat imaging per neurology/neurosurgery Suspect the infarct is related to her vasospasm, septic emboli seems less likely but getting MRI to r O more extensive embolic process   h/o ETOH use, polysubstance abuse (cocaine) and chronic pain Plan Continue thiamine , folate, multivitamin supplementation Seizure precautions  Concern for endocarditis - TTE 9/2 with hyperechoic mobile density on the anterior leaflet tip of the MV which measures 0.473 cm x 0.694 cm. Cannot rule out a small vegetation.  So far cultures have been negative Plan TEE pending when she is more stable  Day number 6 ceftriaxone , vancomycin  was discontinued on 9/2   SIRS from above vs Sepsis, still having fever -Source not clear, culture data remains negative, white cells improving-Vanc discontinued 9/2   Plan Continue Ceftriaxone , currently day #6 Will check procalcitonin today repeat CBC, as we have not identified any organism I think we can probably discontinue antibiotics on day 7   Intermittent Fluid and electrolyte imbalance: Mild hyperchloremia, hypokalemia, non-anion gap metabolic acidosis Plan Potassium replaced this morning Follow-up a.m. potassium  History of HTN Plan Resume home antihypertensive when appropriate, permissive hypertension currently  DMT2 Currently with excellent glycemic control Plan  Continue SSI, every 4 hours CBG goal 140-180  H/o COPD -No s/sx exacerbation  Plan As needed supplemental oxygen for SpO2 goal greater than 90% As needed bronchodilator Enncourage pulmonary hygiene as able  Physical deconditioning At risk for malnutrition Plan PT/OT/SLP as able Continue tube feeds via cortrak   Critical care time: 42 minutes  CRITICAL CARE Performed by: Jeralyn FORBES Banner

## 2024-07-30 NOTE — Progress Notes (Signed)
 Signed      Transcranial Doppler   Date POD PCO2 HCT BP   MCA ACA PCA OPHT SIPH VERT Basilar  9/2 JH 1   36.7 152/94 Right  Left   *  *   *  *   *  *   16  10   *  22   -23  -23   -31        9/3 JH 2    36.3  158/107  Right  Left    *   *    *   *    *   *    10   13    25   19     -19   -16    *   *     9/5 RS         Right  Left    *   *    *   *    *   *    20   19    *   42    -22   -28    -29                   Right  Left                                                                 Right  Left                                                               Right  Left                                                               Right  Left                                                       MCA = Middle Cerebral Artery      OPHT = Opthalmic Artery     BASILAR = Basilar Artery   ACA = Anterior Cerebral Artery     SIPH = Carotid Siphon PCA = Posterior Cerebral Artery   VERT = Verterbral Artery                    Normal MCA = 62+\-12 ACA = 50+\-12 PCA = 42+\-23         (*) Not insonated -Poor transtemporal windows. Ricka Elder Molt, RDMS, RVT

## 2024-07-31 LAB — GLUCOSE, CAPILLARY
Glucose-Capillary: 109 mg/dL — ABNORMAL HIGH (ref 70–99)
Glucose-Capillary: 124 mg/dL — ABNORMAL HIGH (ref 70–99)
Glucose-Capillary: 137 mg/dL — ABNORMAL HIGH (ref 70–99)
Glucose-Capillary: 146 mg/dL — ABNORMAL HIGH (ref 70–99)
Glucose-Capillary: 162 mg/dL — ABNORMAL HIGH (ref 70–99)
Glucose-Capillary: 174 mg/dL — ABNORMAL HIGH (ref 70–99)
Glucose-Capillary: 175 mg/dL — ABNORMAL HIGH (ref 70–99)
Glucose-Capillary: 236 mg/dL — ABNORMAL HIGH (ref 70–99)
Glucose-Capillary: 87 mg/dL (ref 70–99)

## 2024-07-31 LAB — CBC
HCT: 33.9 % — ABNORMAL LOW (ref 36.0–46.0)
Hemoglobin: 11.6 g/dL — ABNORMAL LOW (ref 12.0–15.0)
MCH: 29.5 pg (ref 26.0–34.0)
MCHC: 34.2 g/dL (ref 30.0–36.0)
MCV: 86.3 fL (ref 80.0–100.0)
Platelets: 182 K/uL (ref 150–400)
RBC: 3.93 MIL/uL (ref 3.87–5.11)
RDW: 14.3 % (ref 11.5–15.5)
WBC: 17.9 K/uL — ABNORMAL HIGH (ref 4.0–10.5)
nRBC: 0 % (ref 0.0–0.2)

## 2024-07-31 LAB — BASIC METABOLIC PANEL WITH GFR
Anion gap: 11 (ref 5–15)
BUN: 8 mg/dL (ref 8–23)
CO2: 21 mmol/L — ABNORMAL LOW (ref 22–32)
Calcium: 8.9 mg/dL (ref 8.9–10.3)
Chloride: 105 mmol/L (ref 98–111)
Creatinine, Ser: 0.51 mg/dL (ref 0.44–1.00)
GFR, Estimated: >60 mL/min (ref >60)
Glucose, Bld: 170 mg/dL — ABNORMAL HIGH (ref 70–99)
Potassium: 3.5 mmol/L (ref 3.5–5.1)
Sodium: 137 mmol/L (ref 135–145)

## 2024-07-31 LAB — PROCALCITONIN: Procalcitonin: 0.12 ng/mL

## 2024-07-31 LAB — MAGNESIUM: Magnesium: 2 mg/dL (ref 1.7–2.4)

## 2024-07-31 LAB — PHOSPHORUS: Phosphorus: 3.4 mg/dL (ref 2.5–4.6)

## 2024-07-31 MED ORDER — IPRATROPIUM-ALBUTEROL 0.5-2.5 (3) MG/3ML IN SOLN
3.0000 mL | Freq: Two times a day (BID) | RESPIRATORY_TRACT | Status: DC
  Administered 2024-08-01 – 2024-08-02 (×3): 3 mL via RESPIRATORY_TRACT
  Filled 2024-07-31 (×3): qty 3

## 2024-07-31 MED ORDER — POTASSIUM CHLORIDE 20 MEQ PO PACK
40.0000 meq | PACK | Freq: Once | ORAL | Status: AC
  Administered 2024-07-31: 40 meq
  Filled 2024-07-31: qty 2

## 2024-07-31 MED ORDER — SODIUM CHLORIDE 0.9 % IV BOLUS
1000.0000 mL | Freq: Once | INTRAVENOUS | Status: AC
  Administered 2024-07-31: 1000 mL via INTRAVENOUS

## 2024-07-31 NOTE — Progress Notes (Signed)
 Patient ID: Daisy [name], female   DOB: 11-02-1956, 68 y.o.   MRN: [id number] Patient arouses to painful stimulation and will open eyes and track has not been following commands but has withdrawal response brisk more in the lower extremities than the uppers.  IVC is draining modestly.  Continues to be monitored in ICU.

## 2024-07-31 NOTE — Progress Notes (Signed)
 eLink Physician-Brief Progress Note Patient Name: Daisy [name] DOB: 1956/09/30 MRN: [id number]   Date of Service  07/31/2024  HPI/Events of Note  Patient's telemetry alarming ST elevations, EKG obtained  eICU Interventions  EKG without evidence of acute ischemia, no intervention     Intervention Category Intermediate Interventions: Arrhythmia - evaluation and management  Hakim Minniefield 07/31/2024, 5:36 AM

## 2024-07-31 NOTE — Significant Event (Signed)
 Got called because ST-T changes seen on tele. EKG done is similar to 5 am EKG w RBBB (known) and bifascicular block. Patient is not bradycardic. Will monitor for now. Electrolytes replaced.

## 2024-07-31 NOTE — Progress Notes (Signed)
 EKG obtained due to ST elevation on cardiac monitor. [facility] notified of results uploaded in the chart.

## 2024-07-31 NOTE — Progress Notes (Signed)
 [facility] ADULT ICU REPLACEMENT PROTOCOL   The patient does apply for the [facility] Adult ICU Electrolyte Replacment Protocol based on the criteria listed below:   1.Exclusion criteria: TCTS, ECMO, Dialysis, and Myasthenia Gravis patients 2. Is GFR >/= 30 ml/min? Yes.    Patient's GFR today is  >60 3. Is SCr </= 2? Yes.   Patient's SCr is 0.51 mg/dL 4. Did SCr increase >/= 0.5 in 24 hours? No. 5.Pt's weight >40kg  Yes.   6. Abnormal electrolyte(s):   K 3.5  7. Electrolytes replaced per protocol 8.  Call MD STAT for K+ </= 2.5, Phos </= 1, or Mag </= 1 Physician:  A. Haze Blackbird R Shahad Mazurek 07/31/2024 6:29 AM

## 2024-07-31 NOTE — Progress Notes (Signed)
 NAME:  Daisy [name], MRN:  [id number], DOB:  September 21, 1956, LOS: 6 ADMISSION DATE:  07/25/2024, CONSULTATION DATE:  07/25/2024 REFERRING MD: Darnella - NSGY, CHIEF COMPLAINT:  Sepsis/SAH   History of Present Illness:  68 year old woman who presented to [facility] 8/31 for AMS and not feel well x 1 week. PMHx significant for HTN, COPD (not on home O2), DMT2, vitamin D deficiency, GERD, chronic back pain, EtOH abuse, polysubstance abuse.  Per report, patient was confused and feeling unwell x 1 week and was found down at home. Patient's son called 911. EMS found patient on the ground and drenched in urine with tachycardia and temp 99.56F. Patient was reportedly more confused on day of admission and had been urinating frequently and on herself and having headaches.  She drinks 2-6 beers daily and uses cocaine (last 1 week PTA).  On ED arrival, labs were notable for K 2.6, AG 17, LA 3--->5.9, WBC 17.8. Working diagnosis was sepsis and patient was started on antibiotics.  Due to confusion, CT Head performed demonstrating SAH.  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  CT chest/abd/pelvis did not reveal any acute issues. Transferred to [facility] for further care.  PCCM consulted.  Pertinent Medical History:  HTN, COPD (not on home O2), DMT2, Vitamin D deficiency, GERD, chronic back pain, EtOH abuse, polysubstance abuse   Significant Hospital Events: Including procedures, antibiotic start and stop dates in addition to other pertinent events   8/31 - Transferred to [facility] ICU from [facility] acute SAH d/t ruptured Acomm and associated Hydrocephalus 9/1 - Underwent Acomm aneurysm coiling (NSGY) 9/2 Per RN, decreased responsiveness since coiling; prior to intervention was alert and oriented to self, talking/following commands. Spot EEG negative for seizures. Permissive HTN, off of Cleviprex  per NSGY.  Echocardiogram showed  hyperechoic mobile density in the anterior left hip of the mitral valve.  LVEF 60 to 65% normal RV function. Mental status declined on 9/1 with worsening lethargy no longer following commands.  CT was initially negative for change, EEG has been negative for seizure.  BP parameters changed to make goal 180-200 for permissive hypertension on 9/2.  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  Mental status continues to be poor.  Cultures still negative. 9/6: Tracking today.  Seeing ouch to painful stimuli.  Interim History / Subjective:  Neuro exam improved today.  She is tracking today.  She is even responding verbally by saying ouch to painful stimuli. Levo at 20.  Procalcitonin 0.12.  Low-grade fevers.  Objective:   Blood pressure (!) 163/88, pulse (!) 110, temperature 99.1 F (37.3 C), temperature source Axillary, resp. rate (!) 27, height 5' 6 (1.676 m), weight 69.6 kg, SpO2 98%.        Intake/Output Summary (Last 24 hours) at 07/31/2024 1518 Last data filed at 07/31/2024 1200 Gross per 24 hour  Intake 3007.88 ml  Output 3294 ml  Net -286.12 ml   Filed Weights   07/27/24 0500 07/28/24 0500 07/29/24 0704  Weight: 69.7 kg 69.7 kg 69.6 kg   Physical Examination: General: In no apparent distress.  Nonresponsive to verbal or  tactile stimuli. Lungs: Clear to auscultation bilaterally. Heart: regular rate rhythm, no murmur appreciated.  Abdomen: non tender, non distended. Normal BS.  Neuro:  Dorsiflexion of foot bilaterally on painful stimuli.  Better withdrawal of right upper extremity and left upper extremity compared to yesterday strength 3/5.  Tracking today.  Responding by  saying ouch to painful stimuli.  Resolved Problem List:  Lactic acidosis resolved 9/1 SIRS: off abx.   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  Prior SAH, IVH and ventricular enlargement have improved Acute encephalopathy due to [facility]: - Continue nimodipine .  - SBP goal greater than 180-200.  We will try to achieve this goal by adding pressors.  Currently on nor epi.  If needed will add vaso. -TCD's per protocol.  Poor window and hence difficult to determine ratio. -MRI done showed large left ACA acute infarct without midline shift and other multiple vascular territory infarct. -Neurosurgery following. -Keppra  7 days per neurosurgery. -Maintain normal sodium levels. - EEG unremarkable done when patient was having lipsmacking movement.   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  Blood cultures have been negative.  Presented with elevated lactate but now normal.  - Discussed with neurosurgery and aneurysm position less likely to be secondary to mycotic aneurysm. - TEE will be low yield.  Considering the small vegetation seen on TTE, and patient's current clinical picture cardiology deferred TEE.  Will get TEE once patient has improved.   Wheezing: History of COPD: - Per family no history of smoking.  However reportedly has COPD. -Solu-Medrol  and albuterol  nebs  h/o ETOH use, polysubstance abuse (cocaine) and chronic pain:  - Continue thiamine , folate, multivitamin supplementation - Seizure precautions   Hypokalemia: - Replace as needed.   History of HTN: - Resume home antihypertensive when appropriate, permissive hypertension currently  DMT2:  - Continue SSI, every 4 hours - CBG goal 140-180  Physical deconditioning: At risk for  malnutrition: - PT/OT/SLP - Continue tube feeds via cortrak  CRITICAL CARE Performed by: Sammi JONETTA Fredericks.     Total critical care time: 36 minutes   Critical care time was exclusive of separately billable procedures and treating other patients.   Critical care was necessary to treat or prevent imminent or life-threatening deterioration.   Critical care was time spent personally by me on the following activities: development of treatment plan with patient and/or surrogate as well as nursing, discussions with consultants, evaluation of patient's response to treatment, examination of patient, obtaining history from patient or surrogate, ordering and performing treatments and interventions, ordering and review of laboratory studies, ordering and review of radiographic studies, pulse oximetry, re-evaluation of patient's condition and participation in multidisciplinary rounds.  Sammi JONETTA Fredericks, MD Pulmonary, Critical Care and Sleep Attending.  Pager: [phone]  07/31/2024, 3:35 PM

## 2024-08-01 LAB — PHOSPHORUS: Phosphorus: 3.4 mg/dL (ref 2.5–4.6)

## 2024-08-01 LAB — BASIC METABOLIC PANEL WITH GFR
Anion gap: 12 (ref 5–15)
BUN: 10 mg/dL (ref 8–23)
CO2: 20 mmol/L — ABNORMAL LOW (ref 22–32)
Calcium: 8.9 mg/dL (ref 8.9–10.3)
Chloride: 105 mmol/L (ref 98–111)
Creatinine, Ser: 0.59 mg/dL (ref 0.44–1.00)
GFR, Estimated: >60 mL/min (ref >60)
Glucose, Bld: 184 mg/dL — ABNORMAL HIGH (ref 70–99)
Potassium: 3.5 mmol/L (ref 3.5–5.1)
Sodium: 137 mmol/L (ref 135–145)

## 2024-08-01 LAB — GLUCOSE, CAPILLARY
Glucose-Capillary: 114 mg/dL — ABNORMAL HIGH (ref 70–99)
Glucose-Capillary: 119 mg/dL — ABNORMAL HIGH (ref 70–99)
Glucose-Capillary: 134 mg/dL — ABNORMAL HIGH (ref 70–99)
Glucose-Capillary: 146 mg/dL — ABNORMAL HIGH (ref 70–99)
Glucose-Capillary: 158 mg/dL — ABNORMAL HIGH (ref 70–99)
Glucose-Capillary: 164 mg/dL — ABNORMAL HIGH (ref 70–99)

## 2024-08-01 LAB — MAGNESIUM: Magnesium: 2.1 mg/dL (ref 1.7–2.4)

## 2024-08-01 LAB — PROCALCITONIN: Procalcitonin: 0.12 ng/mL

## 2024-08-01 MED ORDER — IPRATROPIUM-ALBUTEROL 0.5-2.5 (3) MG/3ML IN SOLN
3.0000 mL | Freq: Four times a day (QID) | RESPIRATORY_TRACT | Status: DC | PRN
  Filled 2024-08-01: qty 3

## 2024-08-01 MED ORDER — LOPERAMIDE HCL 1 MG/7.5ML PO SUSP
4.0000 mg | Freq: Once | ORAL | Status: AC
  Administered 2024-08-01: 4 mg
  Filled 2024-08-01: qty 30

## 2024-08-01 MED ORDER — POTASSIUM CHLORIDE 20 MEQ PO PACK
40.0000 meq | PACK | Freq: Once | ORAL | Status: AC
  Administered 2024-08-01: 40 meq
  Filled 2024-08-01: qty 2

## 2024-08-01 MED ORDER — NOREPINEPHRINE 16 MG/250ML-% IV SOLN
0.0000 ug/min | INTRAVENOUS | Status: DC
  Administered 2024-08-01 – 2024-08-02 (×7): 40 ug/min via INTRAVENOUS
  Administered 2024-08-03: 34 ug/min via INTRAVENOUS
  Administered 2024-08-03: 16 ug/min via INTRAVENOUS
  Administered 2024-08-04 (×2): 28 ug/min via INTRAVENOUS
  Filled 2024-08-01 (×11): qty 250

## 2024-08-01 NOTE — Progress Notes (Signed)
 Patient ID: Daisy [name], female   DOB: September 22, 1956, 68 y.o.   MRN: [id number] Status post coiling of anterior cerebral artery aneurysm 1.  Patient is awake and will follow some simple commands but mostly she is very somnolent.  Continue supportive care for now observing for vasospasm.

## 2024-08-01 NOTE — Progress Notes (Signed)
 NAME:  Daisy [name], MRN:  [id number], DOB:  [birth year], LOS: 7 ADMISSION DATE:  07/25/2024, CONSULTATION DATE:  07/25/2024 REFERRING MD: Darnella - NSGY, CHIEF COMPLAINT:  Sepsis/SAH   History of Present Illness:  68 year old woman who presented to [facility] 8/31 for AMS and not feel well x 1 week. PMHx significant for HTN, COPD (not on home O2), DMT2, vitamin D deficiency, GERD, chronic back pain, EtOH abuse, polysubstance abuse.  Per report, patient was confused and feeling unwell x 1 week and was found down at home. Patient's son called 911. EMS found patient on the ground and drenched in urine with tachycardia and temp 99.14F. Patient was reportedly more confused on day of admission and had been urinating frequently and on herself and having headaches.  She drinks 2-6 beers daily and uses cocaine (last 1 week PTA).  On ED arrival, labs were notable for K 2.6, AG 17, LA 3--->5.9, WBC 17.8. Working diagnosis was sepsis and patient was started on antibiotics.  Due to confusion, CT Head performed demonstrating SAH.  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  CT chest/abd/pelvis did not reveal any acute issues. Transferred to [facility] for further care.  PCCM consulted.  Pertinent Medical History:  HTN, COPD (not on home O2), DMT2, Vitamin D deficiency, GERD, chronic back pain, EtOH abuse, polysubstance abuse   Significant Hospital Events: Including procedures, antibiotic start and stop dates in addition to other pertinent events   8/31 - Transferred to [facility] ICU from [facility] acute SAH d/t ruptured Acomm and associated Hydrocephalus 9/1 - Underwent Acomm aneurysm coiling (NSGY) 9/2 Per RN, decreased responsiveness since coiling; prior to intervention was alert and oriented to self, talking/following commands. Spot EEG negative for seizures. Permissive HTN, off of Cleviprex  per NSGY.  Echocardiogram showed  hyperechoic mobile density in the anterior left hip of the mitral valve.  LVEF 60 to 65% normal RV function. Mental status declined on 9/1 with worsening lethargy no longer following commands.  CT was initially negative for change, EEG has been negative for seizure.  BP parameters changed to make goal 180-200 for permissive hypertension on 9/2.  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  Mental status continues to be poor.  Cultures still negative. 9/6: Tracking today.  Saying ouch to painful stimuli.  Interim History / Subjective:  Neuroexam has improved over the last 2 days.  Now able to move left upper extremity against gravity.  Is tracking though does not cross midline to right.  Reportedly patient said that shit hurts when given painful stimuli.  Objective:   Blood pressure (!) 163/106, pulse (!) 117, temperature 100.2 F (37.9 C), temperature source Axillary, resp. rate (!) 26, height 5' 6 (1.676 m), weight 69.6 kg, SpO2 98%.        Intake/Output Summary (Last 24 hours) at 08/01/2024 1127 Last data filed at 08/01/2024 0900 Gross per 24 hour  Intake 5014.06 ml  Output 3519 ml  Net 1495.06 ml   Filed Weights   07/27/24 0500 07/28/24 0500 07/29/24 0704  Weight: 69.7 kg 69.7 kg 69.6 kg   Physical Examination: General:  In no apparent distress.  Lungs: Clear to auscultation bilaterally. Heart: regular rate rhythm, no murmur appreciated.  Abdomen: non tender, non distended. Normal BS.  Neuro:  Dorsiflexion of foot bilaterally on painful stimuli. LUE 3/5, RUE 2/5 Tracking today better than yesterday but not crossing midline to right.  Responding by saying ouch to painful  stimuli.  Resolved Problem List:  Lactic acidosis resolved 9/1 SIRS: off abx.   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  Prior SAH, IVH and ventricular enlargement have improved Acute encephalopathy due to [facility]: - Continue nimodipine .  - SBP goal greater than 180-200.  We will try to achieve this goal by adding pressors.  Currently on nor epi.  If needed will add vaso. -TCD's per protocol.  Poor window and hence difficult to determine ratio. -MRI done showed large left ACA acute infarct without midline shift and other multiple vascular territory infarct. -Neurosurgery following. -Keppra  7 days per neurosurgery. -Maintain normal sodium levels. - EEG unremarkable done when patient was having lipsmacking movement.   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  Blood cultures have been negative.  Presented with elevated lactate but now normal.  - Discussed with neurosurgery and aneurysm position less likely to be secondary to mycotic aneurysm. - TEE will be low yield.  Considering the small vegetation seen on TTE, and patient's current clinical picture cardiology deferred TEE.  Will get TEE once patient has improved.   Wheezing: History of COPD: - Per family no history of smoking.  However reportedly has COPD. -Solu-Medrol  and albuterol  nebs.  Will DC steroids today.  h/o ETOH use, polysubstance abuse (cocaine) and chronic pain:  - Continue thiamine , folate, multivitamin supplementation - Seizure precautions   Hypokalemia: - Replace as needed.   History of HTN: - Resume home antihypertensive when appropriate, permissive hypertension currently  DMT2:  - Continue SSI, every 4 hours - CBG goal 140-180  Physical deconditioning: At risk for  malnutrition: - PT/OT/SLP - Continue tube feeds via cortrak  Loose BM.  No concern for infection.  Will give Imodium .  CRITICAL CARE Performed by: Sammi JONETTA Fredericks.     Total critical care time: 35 minutes   Critical care time was exclusive of separately billable procedures and treating other patients.   Critical care was necessary to treat or prevent imminent or life-threatening deterioration.   Critical care was time spent personally by me on the following activities: development of treatment plan with patient and/or surrogate as well as nursing, discussions with consultants, evaluation of patient's response to treatment, examination of patient, obtaining history from patient or surrogate, ordering and performing treatments and interventions, ordering and review of laboratory studies, ordering and review of radiographic studies, pulse oximetry, re-evaluation of patient's condition and participation in multidisciplinary rounds.  Sammi JONETTA Fredericks, MD Pulmonary, Critical Care and Sleep Attending.  Pager: [phone]  08/01/2024, 11:27 AM

## 2024-08-01 NOTE — Progress Notes (Signed)
 SLP Cancellation Note  Patient Details Name: Daisy [name] MRN: [id number] DOB: November 21, 1956   Cancelled treatment:       Reason Eval/Treat Not Completed: Patient's level of consciousness Per RN, patient was alert earlier today but recently fell back asleep. SLP entered patient's room to attempt evaluation, however she was asleep and did not respond to tactile stimulation or loud voice. Her significant other was in the room with their dog and even when dog barking loudly at SLP, patient did not rouse. SLP will continue to follow for readiness.   Norleen IVAR Blase, MA, CCC-SLP Speech Therapy

## 2024-08-01 NOTE — Progress Notes (Signed)
 eLink Physician-Brief Progress Note Patient Name: Daisy [name] DOB: 06/29/1956 MRN: [id number]   Date of Service  08/01/2024  HPI/Events of Note  Sys bp goal remains 180-120.  Now on high dose levophed  and vasopressin .  Not meeting goal.  eICU Interventions  Continue present vasopressors and attempt to meet goal.  Will not add 3rd vasopressor for now     Intervention Category Intermediate Interventions: Other:  CLAUDENE AGENT, SHAUNNA 08/01/2024, 9:45 PM

## 2024-08-01 NOTE — Plan of Care (Signed)
 Pt has remained on RA throughout the day with O2 SAT above 92%, no oral secretions needing suctioning. Pt at goal TF rate, tolerating via cortrak well. Pt voiding freely and adequate amounts using female purewick device.

## 2024-08-02 ENCOUNTER — Inpatient Hospital Stay (HOSPITAL_COMMUNITY)

## 2024-08-02 DIAGNOSIS — G9340 Encephalopathy, unspecified: Secondary | ICD-10-CM | POA: Diagnosis not present

## 2024-08-02 DIAGNOSIS — E119 Type 2 diabetes mellitus without complications: Secondary | ICD-10-CM | POA: Diagnosis not present

## 2024-08-02 DIAGNOSIS — I609 Nontraumatic subarachnoid hemorrhage, unspecified: Secondary | ICD-10-CM

## 2024-08-02 LAB — CBC
HCT: 37.7 % (ref 36.0–46.0)
Hemoglobin: 12.9 g/dL (ref 12.0–15.0)
MCH: 29.4 pg (ref 26.0–34.0)
MCHC: 34.2 g/dL (ref 30.0–36.0)
MCV: 85.9 fL (ref 80.0–100.0)
Platelets: 216 K/uL (ref 150–400)
RBC: 4.39 MIL/uL (ref 3.87–5.11)
RDW: 14.4 % (ref 11.5–15.5)
WBC: 18.4 K/uL — ABNORMAL HIGH (ref 4.0–10.5)
nRBC: 0.2 % (ref 0.0–0.2)

## 2024-08-02 LAB — GLUCOSE, CAPILLARY
Glucose-Capillary: 145 mg/dL — ABNORMAL HIGH (ref 70–99)
Glucose-Capillary: 160 mg/dL — ABNORMAL HIGH (ref 70–99)
Glucose-Capillary: 157 mg/dL — ABNORMAL HIGH (ref 70–99)
Glucose-Capillary: 158 mg/dL — ABNORMAL HIGH (ref 70–99)

## 2024-08-02 LAB — BASIC METABOLIC PANEL WITH GFR
Anion gap: 16 — ABNORMAL HIGH (ref 5–15)
BUN: 12 mg/dL (ref 8–23)
CO2: 19 mmol/L — ABNORMAL LOW (ref 22–32)
Calcium: 9.3 mg/dL (ref 8.9–10.3)
Chloride: 103 mmol/L (ref 98–111)
Creatinine, Ser: 0.49 mg/dL (ref 0.44–1.00)
GFR, Estimated: >60 mL/min (ref >60)
Glucose, Bld: 114 mg/dL — ABNORMAL HIGH (ref 70–99)
Potassium: 4.1 mmol/L (ref 3.5–5.1)
Sodium: 138 mmol/L (ref 135–145)

## 2024-08-02 LAB — PROCALCITONIN: Procalcitonin: 0.16 ng/mL

## 2024-08-02 MED ORDER — MIDODRINE HCL 5 MG PO TABS
10.0000 mg | ORAL_TABLET | Freq: Three times a day (TID) | ORAL | Status: DC
  Administered 2024-08-02 – 2024-08-09 (×20): 10 mg
  Filled 2024-08-02 (×20): qty 2

## 2024-08-02 NOTE — Progress Notes (Signed)
 VASCULAR LAB     Transcranial Doppler   Date POD PCO2 HCT BP   MCA ACA PCA OPHT SIPH VERT Basilar  9/2 JH 1   36.7 152/94 Right  Left   *  *   *  *   *  *   16  10   *  22   -23  -23   -31        9/3 JH 2    36.3  158/107  Right  Left    *   *    *   *    *   *    10   13    25   19     -19   -16    *   *     9/5 RS         Right  Left    *   *    *   *    *   *    20   19    *   42    -22   -28    -29        9/8 CK          Right  Left    *   *    *   *    *   *    20   19    *   56    *   *    *   *                Right  Left                                                               Right  Left                                                               Right  Left                                                       MCA = Middle Cerebral Artery      OPHT = Opthalmic Artery     BASILAR = Basilar Artery   ACA = Anterior Cerebral Artery     SIPH = Carotid Siphon PCA = Posterior Cerebral Artery   VERT = Verterbral Artery                    Normal MCA = 62+\-12 ACA = 50+\-12 PCA = 42+\-23         (*) Not insonated -Poor transtemporal windows.       Elora Wolter, RVT 08/02/2024, 12:09 PM

## 2024-08-02 NOTE — Progress Notes (Signed)
 NAME:  Daisy [name], MRN:  [id number], DOB:  1956-07-22, LOS: 8 ADMISSION DATE:  07/25/2024, CONSULTATION DATE:  07/25/2024 REFERRING MD: Darnella - NSGY, CHIEF COMPLAINT:  Sepsis/SAH   History of Present Illness:  68 year old woman who presented to [facility] 8/31 for AMS and not feel well x 1 week. PMHx significant for HTN, COPD (not on home O2), DMT2, vitamin D deficiency, GERD, chronic back pain, EtOH abuse, polysubstance abuse.  Per report, patient was confused and feeling unwell x 1 week and was found down at home. Patient's son called 911. EMS found patient on the ground and drenched in urine with tachycardia and temp 99.28F. Patient was reportedly more confused on day of admission and had been urinating frequently and on herself and having headaches.  She drinks 2-6 beers daily and uses cocaine (last 1 week PTA).  On ED arrival, labs were notable for K 2.6, AG 17, LA 3--->5.9, WBC 17.8. Working diagnosis was sepsis and patient was started on antibiotics.  Due to confusion, CT Head performed demonstrating SAH.  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  CT chest/abd/pelvis did not reveal any acute issues. Transferred to [facility] for further care.  PCCM consulted.  Pertinent Medical History:  HTN, COPD (not on home O2), DMT2, Vitamin D deficiency, GERD, chronic back pain, EtOH abuse, polysubstance abuse   Significant Hospital Events: Including procedures, antibiotic start and stop dates in addition to other pertinent events   8/31 - Transferred to [facility] ICU from [facility] acute SAH d/t ruptured Acomm and associated Hydrocephalus 9/1 - Underwent Acomm aneurysm coiling (NSGY) 9/2 Per RN, decreased responsiveness since coiling; prior to intervention was alert and oriented to self, talking/following commands. Spot EEG negative for seizures. Permissive HTN, off of Cleviprex  per NSGY.  Echocardiogram showed  hyperechoic mobile density in the anterior left hip of the mitral valve.  LVEF 60 to 65% normal RV function. Mental status declined on 9/1 with worsening lethargy no longer following commands.  CT was initially negative for change, EEG has been negative for seizure.  BP parameters changed to make goal 180-200 for permissive hypertension on 9/2.  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  Mental status continues to be poor.  Cultures still negative. 9/6: Tracking today.  Saying ouch to painful stimuli.  Interim History / Subjective:  Awake in room. No verbal response. No physical response to stimulus.   Objective:   Blood pressure (!) 176/99, pulse 100, temperature 99.5 F (37.5 C), temperature source Axillary, resp. rate 19, height 5' 6 (1.676 m), weight 69.6 kg, SpO2 98%.        Intake/Output Summary (Last 24 hours) at 08/02/2024 0903 Last data filed at 08/02/2024 0800 Gross per 24 hour  Intake 2724.3 ml  Output 2645 ml  Net 79.3 ml   Filed Weights   07/28/24 0500 07/29/24 0704  Weight: 69.7 kg 69.6 kg   Physical Examination: General:awake, does make eye contact but does not track. No distress  Lungs: Clear to auscultation bilaterally  Heart:s1s2 no murmur, rub, gallop Abdomen: rounded, ntnd, +BS Neuro: awake, she makes eye contact but  does not track. Does not cross midline to the right. No verbal response to painful stimulus today. Delayed dorsiflexion to painful stimulus. No movement of upper extremities    Resolved Problem List:  Lactic acidosis resolved 9/1 SIRS: off abx.   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  Prior SAH, IVH and ventricular enlargement have improved - Continue nimodipine .  - SBP goal changes to 160-200. Currently on max levophed  and vasopressin  to maintain SBP. Will not add 3rd pressor.  - will replace a-line today  -TCD's today and per protocol -MRI done showed large left ACA acute infarct without midline shift and other multiple vascular territory infarct. -Neurosurgery following. -Keppra  7 days per neurosurgery (finish 9/9) -Maintain normal sodium levels.   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  Blood cultures have been negative.  Presented with elevated lactate but now normal. Per NSGY, aneurysm position less likely to be secondary to mycotic aneurysm.  - TEE low yield  - cardiology deferred TEE until patient has improved    History of COPD  Per family no history of smoking.  However reportedly has COPD. - duonebs prn   History of ETOH use Polysubstance abuse, cocaine  Chronic pain  - Continue thiamine , folate, multivitamin supplementation - Seizure precautions  History of HTN: - home regimen on hold with need for pressors for SBP goal   DMT2:  - Continue SSI, every 4 hours - CBG goal 140-180  Physical deconditioning: At risk for malnutrition: - PT/OT/SLP - Continue tube feeds via cortrak  CC time: 32 Tinnie FORBES Adolph DEVONNA Maxwell Pulmonary & Critical Care 08/02/24 12:01 PM  Please see [email or web address] for pager details.  From 7A-7P if no response, please call [phone] After hours, please call ELink [phone]

## 2024-08-02 NOTE — Progress Notes (Signed)
  NEUROSURGERY PROGRESS NOTE   Pt seen and examined. No issues overnight  EXAM: Temp:  [98.4 F (36.9 C)-100.6 F (38.1 C)] 99.5 F (37.5 C) (09/08 0400) Pulse Rate:  [89-124] 100 (09/08 0715) Resp:  [15-34] 19 (09/08 0715) BP: (140-192)/(82-119) 176/99 (09/08 0715) SpO2:  [95 %-100 %] 98 % (09/08 0715) Arterial Line BP: (105-206)/(79-127) 138/115 (09/08 0715) Intake/Output      09/07 0701 09/08 0700 09/08 0701 09/09 0700   I.V. (mL/kg) 1502.7 (21.6) 45.5 (0.7)   NG/GT 1300 50   IV Piggyback     Total Intake(mL/kg) 2802.7 (40.3) 95.5 (1.4)   Urine (mL/kg/hr) 2550 (1.5)    Drains 97 4   Stool 0    Total Output 2647 4   Net +155.7 +91.5        Stool Occurrence 1 x     Eyes open spontaneously Occasionally tracks Occasionally says ouch to central pain Not following commands Localizing LUE, minimal w/d RUE. Minimal w/d BLE Right groin site soft EVD in place, patent  LABS: Lab Results  Component Value Date   CREATININE 0.59 08/01/2024   BUN 10 08/01/2024   NA 137 08/01/2024   K 3.5 08/01/2024   CL 105 08/01/2024   CO2 20 (L) 08/01/2024   Lab Results  Component Value Date   WBC 17.9 (H) 07/31/2024   HGB 11.6 (L) 07/31/2024   HCT 33.9 (L) 07/31/2024   MCV 86.3 07/31/2024   PLT 182 07/31/2024   IMAGING: MRI reviewed, demonstrates stable ventricular size, stable SAH. There is a complete LACA territory infarct and scattered likely emolic infarcts involving left operculum and bilateral occipital regions.  IMPRESSION: - 68 y.o. female SAH d# 8 s/p coil embolization of Acom aneurysm. Remains obtunded after coiling which did reveal significant ACA spasm. Spot EEG negative. 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  - TTE reveals possible mitral vegetation, no plan for further eval currently.  PLAN: - Cont with hyperdynamic treatment for goal SBP 180-269mmHg if possible - Cont Nimotop  split Q2 - TCD today   Gerldine Maizes, MD [facility]

## 2024-08-02 NOTE — Progress Notes (Addendum)
@   2301 contacted neurosurgery provider (Meyran NP) to alert that patient's EVD hasn't had any drainage, as per order. Instructed to contact provider if any significant neuro change

## 2024-08-02 NOTE — Progress Notes (Signed)
 Nutrition Follow-up  DOCUMENTATION CODES:   Non-severe (moderate) malnutrition in context of social or environmental circumstances  INTERVENTION:   Continue Tube feeding via Cortrak tube: Osmolite 1.5 @50  ml/hr (1200 ml per day)  Prosource TF20 60 ml BID  Provides 1960 kcal, 115 gm protein, 912 ml free water  daily  Continue MVI with minerals, folic acid  and thiamine    NUTRITION DIAGNOSIS:   Moderate Malnutrition related to social / environmental circumstances as evidenced by moderate fat depletion, severe muscle depletion. Ongoing.   GOAL:   Patient will meet greater than or equal to 90% of their needs Met with TF at goal   MONITOR:   TF tolerance, Labs  REASON FOR ASSESSMENT:   Consult Enteral/tube feeding initiation and management  ASSESSMENT:   Pt with PMH of COPD (no home O2), DM, Vitamin D deficiency, GERD, chronic back pain, ETOH abuse, and polysubstance abuse (cocaine). Pt was not feeling well x 1 week PTA and found down at home by son, admitted with [facility] secondary to ruptured aneurysm.   Pt discussed during ICU rounds and with RN and MD.  Pt remains obtunded; has SBP goals of 160-200 maxed on 2 pressors, per CCM will not add third.   9/1 - s/p EVD placement and coiling  9/2 - s/p cortrak placement; tip distal stomach per xray    Medications reviewed and include:  Nutrisource fiber BID, 1 mg folic acid  per tube daily, SSI every 4 hours, mvi with minerals, nimotop  per tube, protonix , 100 mg thiamine  daily per tube Levophed  @ 40 mcg Vasopressin  @ 0.04  Labs reviewed:  K 4.1 Phos 3.4 (9/7) Mag 2.1 (9/7) A1C 6.3 CBG's: 114-164  EVD: 97 ml     Diet Order:   Diet Order             Diet NPO time specified  Diet effective midnight                   EDUCATION NEEDS:   Not appropriate for education at this time  Skin:  Skin Assessment: Reviewed RN Assessment  Last BM:  9/7 large; type 7 (on nimotop  per tube)  Height:   Ht Readings from  Last 1 Encounters:  07/25/24 5' 6 (1.676 m)    Weight:   Wt Readings from Last 1 Encounters:  01/16/23 66.7 kg    BMI:  Body mass index is 24.77 kg/m.  Estimated Nutritional Needs:   Kcal:  1800-2000  Protein:  110-120 grams  Fluid:  >1.8 L/day  Powell SQUIBB., RD, LDN, CNSC See AMiON for contact information

## 2024-08-02 NOTE — TOC Progression Note (Signed)
 Transition of Care [facility]) - Progression Note    Patient Details  Name: Daisy [name] MRN: [id number] Date of Birth: 1956-03-30  [facility] CM/SW Contact  Inocente GORMAN Kindle, LCSW Phone Number: 08/02/2024, 8:33 AM  Clinical Narrative:    CSW continuing to follow for medical progression.    Expected Discharge Plan: Skilled Nursing Facility Barriers to Discharge: Continued Medical Work up, English as a second language teacher, SNF Pending bed offer               Expected Discharge Plan and Services In-house Referral: Clinical Social Work     Living arrangements for the past 2 months: Single Family Home                                       Social Drivers of Health (SDOH) Interventions SDOH Screenings   Tobacco Use: Low Risk  (07/26/2024)    Readmission Risk Interventions     No data to display

## 2024-08-03 ENCOUNTER — Inpatient Hospital Stay (HOSPITAL_COMMUNITY)

## 2024-08-03 DIAGNOSIS — I609 Nontraumatic subarachnoid hemorrhage, unspecified: Secondary | ICD-10-CM | POA: Diagnosis not present

## 2024-08-03 DIAGNOSIS — E119 Type 2 diabetes mellitus without complications: Secondary | ICD-10-CM | POA: Diagnosis not present

## 2024-08-03 DIAGNOSIS — G9340 Encephalopathy, unspecified: Secondary | ICD-10-CM | POA: Diagnosis not present

## 2024-08-03 LAB — GLUCOSE, CAPILLARY
Glucose-Capillary: 121 mg/dL — ABNORMAL HIGH (ref 70–99)
Glucose-Capillary: 142 mg/dL — ABNORMAL HIGH (ref 70–99)
Glucose-Capillary: 149 mg/dL — ABNORMAL HIGH (ref 70–99)
Glucose-Capillary: 155 mg/dL — ABNORMAL HIGH (ref 70–99)
Glucose-Capillary: 159 mg/dL — ABNORMAL HIGH (ref 70–99)
Glucose-Capillary: 180 mg/dL — ABNORMAL HIGH (ref 70–99)
Glucose-Capillary: 207 mg/dL — ABNORMAL HIGH (ref 70–99)
Glucose-Capillary: 85 mg/dL (ref 70–99)

## 2024-08-03 LAB — BASIC METABOLIC PANEL WITH GFR
Anion gap: 14 (ref 5–15)
BUN: 15 mg/dL (ref 8–23)
CO2: 20 mmol/L — ABNORMAL LOW (ref 22–32)
Calcium: 9.2 mg/dL (ref 8.9–10.3)
Chloride: 104 mmol/L (ref 98–111)
Creatinine, Ser: 0.5 mg/dL (ref 0.44–1.00)
GFR, Estimated: >60 mL/min (ref >60)
Glucose, Bld: 185 mg/dL — ABNORMAL HIGH (ref 70–99)
Potassium: 3.9 mmol/L (ref 3.5–5.1)
Sodium: 138 mmol/L (ref 135–145)

## 2024-08-03 LAB — CBC WITH DIFFERENTIAL/PLATELET
Abs Immature Granulocytes: 0.28 K/uL — ABNORMAL HIGH (ref 0.00–0.07)
Basophils Absolute: 0.1 K/uL (ref 0.0–0.1)
Basophils Relative: 1 %
Eosinophils Absolute: 0.1 K/uL (ref 0.0–0.5)
Eosinophils Relative: 0 %
HCT: 34.8 % — ABNORMAL LOW (ref 36.0–46.0)
Hemoglobin: 12.1 g/dL (ref 12.0–15.0)
Immature Granulocytes: 2 %
Lymphocytes Relative: 13 %
Lymphs Abs: 2.4 K/uL (ref 0.7–4.0)
MCH: 30 pg (ref 26.0–34.0)
MCHC: 34.8 g/dL (ref 30.0–36.0)
MCV: 86.4 fL (ref 80.0–100.0)
Monocytes Absolute: 1.1 K/uL — ABNORMAL HIGH (ref 0.1–1.0)
Monocytes Relative: 6 %
Neutro Abs: 14.9 K/uL — ABNORMAL HIGH (ref 1.7–7.7)
Neutrophils Relative %: 78 %
Platelets: 237 K/uL (ref 150–400)
RBC: 4.03 MIL/uL (ref 3.87–5.11)
RDW: 14.6 % (ref 11.5–15.5)
WBC: 18.8 K/uL — ABNORMAL HIGH (ref 4.0–10.5)
nRBC: 0.1 % (ref 0.0–0.2)

## 2024-08-03 LAB — BRAIN NATRIURETIC PEPTIDE: B Natriuretic Peptide: 135.8 pg/mL — ABNORMAL HIGH (ref 0.0–100.0)

## 2024-08-03 LAB — CORTISOL: Cortisol, Plasma: 17.2 ug/dL

## 2024-08-03 MED ORDER — FUROSEMIDE 10 MG/ML IJ SOLN
20.0000 mg | Freq: Once | INTRAMUSCULAR | Status: AC
  Administered 2024-08-03: 20 mg via INTRAVENOUS
  Filled 2024-08-03: qty 2

## 2024-08-03 NOTE — Evaluation (Signed)
 Clinical/Bedside Swallow Evaluation Patient Details  Name: Daisy [name] MRN: [id number] Date of Birth: 1956-04-05  Today's Date: 08/03/2024 Time: SLP Start Time (ACUTE ONLY): 1100 SLP Stop Time (ACUTE ONLY): 1115 SLP Time Calculation (min) (ACUTE ONLY): 15 min  Past Medical History:  Past Medical History:  Diagnosis Date   DOE (dyspnea on exertion)    Hypertension    Past Surgical History:  Past Surgical History:  Procedure Laterality Date   CARPAL TUNNEL RELEASE     IR ANGIO INTRA EXTRACRAN SEL INTERNAL CAROTID BILAT MOD SED  07/26/2024   IR ANGIO VERTEBRAL SEL VERTEBRAL UNI L MOD SED  07/26/2024   IR ANGIOGRAM FOLLOW UP STUDY  07/26/2024   IR ANGIOGRAM FOLLOW UP STUDY  07/26/2024   IR ANGIOGRAM FOLLOW UP STUDY  07/26/2024   IR NEURO EACH ADD'L AFTER BASIC UNI LEFT (MS)  07/27/2024   IR TRANSCATH/EMBOLIZ  07/26/2024   RADIOLOGY WITH ANESTHESIA N/A 07/26/2024   Procedure: RADIOLOGY WITH ANESTHESIA;  Surgeon: Lanis Pupa, MD;  Location: [facility] OR;  Service: Radiology;  Laterality: N/A;   HPI:  68 yo female adm 07/25/24 after found on ground with confusion 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 S/p diagnostic cerebral angiogram, coil embolization of ACA aneurysm 9/1. EEG 9/2 suggestive of moderate to severe diffuse encephalopathy. PMHx: HTN, polysubstance abuse, chronic back pain, T2DM, COPD    Assessment / Plan / Recommendation  Clinical Impression  Pt demosntrates promising signs of ability to swallow. Though she initiates no other purposeful movements, she does open her mouth as a spoon with ice approaches her mouth. SHe can masticate and swallow ice and also took a few sips of water  from a straw with no coughing or subjective signs of dysphagia. Suspect pt may be able to tolerate a diet, though given her inability to communicate and medical fragility, will proceed with FEES for instrumental assessment with PO in the coming days, when medically appropriate. SLP Visit Diagnosis: Dysphagia,  unspecified (R13.10)    Aspiration Risk  Risk for inadequate nutrition/hydration    Diet Recommendation NPO;Alternative means - temporary         Other  Recommendations       Assistance Recommended at Discharge    Functional Status Assessment    Frequency and Duration min 2x/week          Prognosis        Swallow Study   General HPI: 68 yo female adm 07/25/24 after found on ground with confusion 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 S/p diagnostic cerebral angiogram, coil embolization of ACA aneurysm 9/1. EEG 9/2 suggestive of moderate to severe diffuse encephalopathy. PMHx: HTN, polysubstance abuse, chronic back pain, T2DM, COPD Type of Study: Bedside Swallow Evaluation Previous Swallow Assessment: none Diet Prior to this Study: NPO;Cortrak/Small bore NG tube Temperature Spikes Noted: No Respiratory Status: Room air History of Recent Intubation: Yes (for coiling on 9/1) Total duration of intubation (days): 1 days Date extubated: 07/26/24 Behavior/Cognition: Doesn't follow directions Oral Cavity Assessment: Within Functional Limits Oral Care Completed by SLP: No Oral Cavity - Dentition: Edentulous Vision: Impaired for self-feeding Self-Feeding Abilities: Total assist Patient Positioning: Upright in chair Baseline Vocal Quality: Not observed Volitional Cough: Cognitively unable to elicit Volitional Swallow: Unable to elicit    Oral/Motor/Sensory Function Overall Oral Motor/Sensory Function: Other (comment) (doesnt follow commands, appears WNL)   Ice Chips Ice chips: Within functional limits Presentation: Spoon   Thin Liquid Thin Liquid: Within functional limits Presentation: Straw  Nectar Thick Nectar Thick Liquid: Not tested   Honey Thick Honey Thick Liquid: Not tested   Puree Puree: Not tested   Solid     Solid: Not tested      Jhoselin Crume, Consuelo Fitch 08/03/2024,1:32 PM

## 2024-08-03 NOTE — Progress Notes (Signed)
 NAME:  Daisy [name], MRN:  [id number], DOB:  [birth year], LOS: 9 ADMISSION DATE:  07/25/2024, CONSULTATION DATE:  07/25/2024 REFERRING MD: Darnella - NSGY, CHIEF COMPLAINT:  Sepsis/SAH   History of Present Illness:  68 year old woman who presented to [facility] 8/31 for AMS and not feel well x 1 week. PMHx significant for HTN, COPD (not on home O2), DMT2, vitamin D deficiency, GERD, chronic back pain, EtOH abuse, polysubstance abuse.  Per report, patient was confused and feeling unwell x 1 week and was found down at home. Patient's son called 911. EMS found patient on the ground and drenched in urine with tachycardia and temp 99.44F. Patient was reportedly more confused on day of admission and had been urinating frequently and on herself and having headaches.  She drinks 2-6 beers daily and uses cocaine (last 1 week PTA).  On ED arrival, labs were notable for K 2.6, AG 17, LA 3--->5.9, WBC 17.8. Working diagnosis was sepsis and patient was started on antibiotics.  Due to confusion, CT Head performed demonstrating SAH.  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  CT chest/abd/pelvis did not reveal any acute issues. Transferred to [facility] for further care.  PCCM consulted.  Pertinent Medical History:  HTN, COPD (not on home O2), DMT2, Vitamin D deficiency, GERD, chronic back pain, EtOH abuse, polysubstance abuse   Significant Hospital Events: Including procedures, antibiotic start and stop dates in addition to other pertinent events   8/31 - Transferred to [facility] ICU from [facility] acute SAH d/t ruptured Acomm and associated Hydrocephalus 9/1 - Underwent Acomm aneurysm coiling (NSGY) 9/2 Per RN, decreased responsiveness since coiling; prior to intervention was alert and oriented to self, talking/following commands. Spot EEG negative for seizures. Permissive HTN, off of Cleviprex  per NSGY.  Echocardiogram showed  hyperechoic mobile density in the anterior left hip of the mitral valve.  LVEF 60 to 65% normal RV function. Mental status declined on 9/1 with worsening lethargy no longer following commands.  CT was initially negative for change, EEG has been negative for seizure.  BP parameters changed to make goal 180-200 for permissive hypertension on 9/2.  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  Mental status continues to be poor.  Cultures still negative. 9/6: Tracking today.  Saying ouch to painful stimuli. 9/9: stable. Still on levo/vaso for SBP >160  Interim History / Subjective:  Stable exam. Continues on levo/vaso for SBP goal 160-200. Will check cortisol. Drain clamped today. Continues to have vasospasm. On nimotop   Objective:   Blood pressure (!) 175/103, pulse (!) 102, temperature 100 F (37.8 C), temperature source Axillary, resp. rate (!) 27, height 5' 6 (1.676 m), weight 59 kg, SpO2 97%.        Intake/Output Summary (Last 24 hours) at 08/03/2024 0743 Last data filed at 08/03/2024 0700 Gross per 24 hour  Intake 2475.16 ml  Output 1804 ml  Net 671.16 ml   Filed Weights   07/29/24 0704 08/03/24 0500  Weight: 69.6 kg 59 kg   Physical Examination: General:awake and alert, acute on chronically ill appearing Lungs: Clear to auscultation bilaterally  Heart:s1s2  no murmur, rub, gallop Abdomen: rounded, ntnd, +BS Neuro: awake, she makes eye contact and tracks today. Crosses midline which is improved from yesterday. No verbal response to painful stimulus today. Delayed dorsiflexion to painful stimulus. No movement of upper extremities    Resolved Problem List:  Lactic acidosis  resolved 9/1 SIRS: off abx.   Assessment and Plan:  aSAH due to ruptured ACOM aneurysm status post coiling, associated hydrocephalus status post EVD Acute encephalopathy due to [facility] CTA 9/2: Showing left acute infarct which is new along with vasospasm in ACA, R MCA, left PCA territory.  Prior SAH, IVH and ventricular enlargement have improved. MRI done showed large left ACA acute infarct without midline shift and other multiple vascular territory infarct. - Continue nimodipine .  - SBP goal160-200. Currently on levophed  and vasopressin  to maintain SBP. Will not add 3rd pressor. Levo slightly down today.  - send off cortisol - not sure if she would benefit from fludrocortisone or other agent to augment BP needs/pressor requirement  - midodrine  10mg  TID  - drain clamped today  - repeat head CT 9/10 -TCD's per protocol -Neurosurgery following -Keppra  7 days per neurosurgery (finish 9/9) -Maintain normal sodium levels.   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  Blood cultures have been negative.  Presented with elevated lactate but now normal. Per NSGY, aneurysm position less likely to be secondary to mycotic aneurysm.  - TEE low yield  - cardiology deferred TEE until patient has improved    History of COPD  Per family no history of smoking.  However reportedly has COPD. - duonebs prn   History of ETOH use Polysubstance abuse, cocaine  Chronic pain  - Continue thiamine , folate, multivitamin supplementation - Seizure precautions  History of HTN: - home regimen on hold with need for pressors for SBP goal   DMT2:  - Continue SSI, every 4 hours - CBG goal 140-180  Physical deconditioning: At risk for malnutrition: - PT/OT/SLP - Continue tube feeds via cortrak - protein supplementation, multivitamin, thiamine , folic acid   CC time: 32 Tinnie FORBES Adolph DEVONNA [place] [facility] 08/03/24 7:43 AM  Please see [email or web address] for pager  details.  From 7A-7P if no response, please call [phone] After hours, please call ELink [phone]

## 2024-08-03 NOTE — Progress Notes (Signed)
  NEUROSURGERY PROGRESS NOTE   Pt seen and examined. No issues overnight  EXAM: Temp:  [98.5 F (36.9 C)-101.1 F (38.4 C)] 98.9 F (37.2 C) (09/09 0800) Pulse Rate:  [93-124] 102 (09/09 0715) Resp:  [14-40] 27 (09/09 0715) BP: (140-211)/(82-130) 175/103 (09/09 0715) SpO2:  [94 %-100 %] 97 % (09/09 0715) Arterial Line BP: (105-196)/(69-125) 173/100 (09/09 0715) Weight:  [59 kg] 59 kg (09/09 0500) Intake/Output      09/08 0701 09/09 0700 09/09 0701 09/10 0700   I.V. (mL/kg) 1125.2 (19.1)    NG/GT 1350    Total Intake(mL/kg) 2475.2 (42)    Urine (mL/kg/hr) 1800 (1.3) 125 (0.9)   Drains 4 0   Stool 0    Total Output 1804 125   Net +671.2 -125        Urine Occurrence 2 x    Stool Occurrence 2 x     Eyes open spontaneously Occasionally tracks Occasionally says ouch to central pain Not following commands Localizing LUE, minimal w/d RUE. Minimal w/d BLE Right groin site soft EVD in place, patent at with 0 output x 24 hrs.  LABS: Lab Results  Component Value Date   CREATININE 0.50 08/03/2024   BUN 15 08/03/2024   NA 138 08/03/2024   K 3.9 08/03/2024   CL 104 08/03/2024   CO2 20 (L) 08/03/2024   Lab Results  Component Value Date   WBC 18.8 (H) 08/03/2024   HGB 12.1 08/03/2024   HCT 34.8 (L) 08/03/2024   MCV 86.4 08/03/2024   PLT 237 08/03/2024     IMPRESSION: - 68 y.o. female SAH d# 9 s/p coil embolization of Acom aneurysm. Remains obtunded after coiling which did reveal significant ACA spasm. Spot EEG negative. Left ACA territory stroke and multiple smaller embolic strokes on recent MRI - TTE reveals possible mitral vegetation, no plan for further eval currently. - HCP appears to be resolving  PLAN: - Cont with hyperdynamic treatment for goal SBP >196mmHg if possible - Cont Nimotop  split Q2 - TCD tomorrow - EVD clamped today, plan on [facility] tomorrow. If stable, likely d/c EVD   Gerldine Maizes, MD [facility]

## 2024-08-03 NOTE — Progress Notes (Signed)
 Nursing called me to bedside. About 30 minutes of increased RR and in the past 5-10 minutes of diaphoresis on her face/neck. BG check was normal. Afebrile. She is unfortunately unable to tell me if she is in pain but is not grimacing/groaning. She is not heaving. She had her drain clamped this morning but doubt this is related. Will give her dose of tylenol  if she is experiencing pain. Bladder scan to ensure not an autonomic response to retention/distention. Obtain 12-lead EKG and BNP and CXR and re-evaluate.   Daisy FORBES Furth, PA-C [place] [facility] 08/03/24 12:53 PM  Please see [email or web address] for pager details.  From 7A-7P if no response, please call [phone] After hours, please call ELink [phone]

## 2024-08-03 NOTE — Plan of Care (Signed)

## 2024-08-03 NOTE — Progress Notes (Signed)
 Occupational Therapy Treatment Patient Details Name: Daisy [name] MRN: [id number] DOB: [birth year] Today's Date: 08/03/2024   History of present illness 68 yo female adm 07/25/24 after found on ground with confusion 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 S/p diagnostic cerebral angiogram, coil embolization of ACA aneurysm 9/1. EEG 9/2 suggestive of moderate to severe diffuse encephalopathy. PMHx: HTN, polysubstance abuse, chronic back pain, T2DM, COPD   OT comments  Patient is tracking better and will look at you when you say her name, also able to progress to recliner this session, but remains total A with all ADL and mobility.  OT can continue efforts in the acute setting, and Patient will benefit from continued inpatient follow up therapy, <3 hours/day.  Possible LTC at a facility.         If plan is discharge home, recommend the following:  Two people to help with walking and/or transfers;Two people to help with bathing/dressing/bathroom;Assistance with cooking/housework;Assistance with feeding;Direct supervision/assist for medications management;Direct supervision/assist for financial management;Assist for transportation;Help with stairs or ramp for entrance;Supervision due to cognitive status   Equipment Recommendations  None recommended by OT    Recommendations for Other Services      Precautions / Restrictions Precautions Precautions: Fall Precaution/Restrictions Comments: permissive HTN given vasospasm, no set amount but RN states <200 SBP. EVD clamped during session. Rectal pouch Restrictions Weight Bearing Restrictions Per Provider Order: No       Mobility Bed Mobility Overal bed mobility: Needs Assistance Bed Mobility: Supine to Sit     Supine to sit: Total assist, +2 for physical assistance          Transfers Overall transfer level: Needs assistance   Transfers: Bed to chair/wheelchair/BSC     Squat pivot transfers: Total assist, +2 safety/equipment              Balance Overall balance assessment: Needs assistance Sitting-balance support: Feet supported Sitting balance-Leahy Scale: Zero                                     ADL either performed or assessed with clinical judgement   ADL   Eating/Feeding: NPO   Grooming: Total assistance   Upper Body Bathing: Total assistance   Lower Body Bathing: Total assistance   Upper Body Dressing : Total assistance   Lower Body Dressing: Total assistance   Toilet Transfer: Total assistance   Toileting- Clothing Manipulation and Hygiene: Total assistance   Tub/ Shower Transfer: Total assistance          Extremity/Trunk Assessment Upper Extremity Assessment RUE Deficits / Details: no command following, PROM WFL RUE Sensation: decreased light touch;decreased proprioception RUE Coordination: decreased fine motor;decreased gross motor LUE Deficits / Details: incr tone to elbow and shoulder,likely to benefit from resting hand splint once a line removed LUE Sensation: decreased light touch;decreased proprioception LUE Coordination: decreased fine motor;decreased gross motor   Lower Extremity Assessment Lower Extremity Assessment: Defer to PT evaluation   Cervical / Trunk Assessment Cervical / Trunk Assessment: Other exceptions Cervical / Trunk Exceptions: head turn to L    Vision   Additional Comments: L gaze, but tracking much better, crossing midline   Perception Perception Perception: Not tested   Praxis Praxis Praxis: Not tested   Communication Communication Communication: Impaired Factors Affecting Communication: Difficulty expressing self   Cognition Arousal: Alert Behavior During Therapy: Flat affect Cognition: Difficult to assess Difficult to assess due to:  Impaired communication           OT - Cognition Comments: No command following noted                 Following commands: Impaired        Cueing   Cueing Techniques: Gestural  cues, Verbal cues, Tactile cues, Visual cues                     Pertinent Vitals/ Pain       Pain Assessment Breathing: occasional labored breathing, short period of hyperventilation Negative Vocalization: occasional moan/groan, low speech, negative/disapproving quality Facial Expression: smiling or inexpressive Body Language: relaxed Consolability: no need to console PAINAD Score: 2 Pain Location: grimacing with ROM Pain Descriptors / Indicators: Grimacing, Guarding Pain Intervention(s): Monitored during session                                                          Frequency  Min 2X/week        Progress Toward Goals  OT Goals(current goals can now be found in the care plan section)  Progress towards OT goals: Not progressing toward goals - comment  Acute Rehab OT Goals OT Goal Formulation: Patient unable to participate in goal setting Time For Goal Achievement: 08/10/24 Potential to Achieve Goals: Fair  Plan      Co-evaluation    PT/OT/SLP Co-Evaluation/Treatment: Yes Reason for Co-Treatment: Complexity of the patient's impairments (multi-system involvement);Necessary to address cognition/behavior during functional activity;To address functional/ADL transfers;For patient/therapist safety   OT goals addressed during session: ADL's and self-care      AM-PAC OT 6 Clicks Daily Activity     Outcome Measure   Help from another person eating meals?: Total Help from another person taking care of personal grooming?: Total Help from another person toileting, which includes using toliet, bedpan, or urinal?: Total Help from another person bathing (including washing, rinsing, drying)?: Total Help from another person to put on and taking off regular upper body clothing?: Total Help from another person to put on and taking off regular lower body clothing?: Total 6 Click Score: 6    End of Session Equipment Utilized During Treatment:  Gait belt  OT Visit Diagnosis: Unsteadiness on feet (R26.81);Other abnormalities of gait and mobility (R26.89);Muscle weakness (generalized) (M62.81);History of falling (Z91.81)   Activity Tolerance Patient tolerated treatment well   Patient Left in chair;with call bell/phone within reach;with chair alarm set   Nurse Communication Mobility status        Time: 8994-8971 OT Time Calculation (min): 23 min  Charges: OT General Charges $OT Visit: 1 Visit OT Treatments $Self Care/Home Management : 8-22 mins  08/03/2024  RP, OTR/L  Acute Rehabilitation Services  Office:  [phone]   [name] 08/03/2024, 10:48 AM

## 2024-08-03 NOTE — Progress Notes (Signed)
 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 S/p diagnostic cerebral angiogram, coil embolization of ACA aneurysm 9/1. EEG 9/2 suggestive of moderate to severe diffuse encephalopathy. PMHx: HTN, polysubstance abuse, chronic back pain, T2DM, COPD    PT Comments  Pt tolerates treatment well but remains unable to follow commands. Pt requires totalA for all mobility. PT does note improvement in ability to track to R side, however pt does quickly return to left gaze and cervical rotation without constant stimulation. Patient will benefit from continued inpatient follow up therapy, <3 hours/day.   If plan is discharge home, recommend the following: Two people to help with walking and/or transfers;Two people to help with bathing/dressing/bathroom   Can travel by private vehicle     No  Equipment Recommendations  Hoyer lift;Hospital bed    Recommendations for Other Services       Precautions / Restrictions Precautions Precautions: Fall Recall of Precautions/Restrictions: Impaired Precaution/Restrictions Comments: permissive HTN given vasospasm, no set amount but RN states <200 SBP. EVD clamped during session. Rectal pouch Restrictions Weight Bearing Restrictions Per Provider Order: No     Mobility  Bed Mobility Overal bed mobility: Needs Assistance Bed Mobility: Supine to Sit     Supine to sit: Total assist, +2 for physical assistance          Transfers Overall transfer level: Needs assistance Equipment used: 1 person hand held assist Transfers: Bed to chair/wheelchair/BSC       Squat pivot transfers: Total assist, +2 safety/equipment          Ambulation/Gait                   Stairs             Wheelchair Mobility     Tilt Bed    Modified  Rankin (Stroke Patients Only)       Balance Overall balance assessment: Needs assistance Sitting-balance support: No upper extremity supported, Feet unsupported Sitting balance-Leahy Scale: Zero Sitting balance - Comments: totalA, posterior lean                                    Communication Communication Communication: Impaired Factors Affecting Communication: Difficulty expressing self  Cognition Arousal: Alert Behavior During Therapy: Flat affect   PT - Cognitive impairments: Difficult to assess Difficult to assess due to: Impaired communication                     PT - Cognition Comments: pt does not follow commands, no AROM noted with limbs during this session by PT. Pt does track to R side however quickly returns to L gaze and cervical rotation Following commands: Impaired Following commands impaired:  (no command following)    Cueing Cueing Techniques: Gestural cues, Verbal cues, Tactile cues, Visual cues  Exercises Other Exercises Other Exercises: PT provides PROM to BLE: 10 reps of hip flex/ext, knee flex/ext, ankle DF stretch x 30 seconds Other Exercises: PROM RUE shoulder in D1 PNF pattern x 10 reps. elbow flex/ext PROM x 10 reps    General Comments General comments (skin integrity, edema, etc.): VSS on RA.      Pertinent Vitals/Pain Pain Assessment Pain Assessment: PAINAD Breathing: occasional labored breathing, short period  of hyperventilation Negative Vocalization: occasional moan/groan, low speech, negative/disapproving quality Facial Expression: smiling or inexpressive Body Language: relaxed Consolability: no need to console PAINAD Score: 2 Pain Location: grimacing with ROM Pain Descriptors / Indicators: Moaning Pain Intervention(s): Limited activity within patient's tolerance    Home Living                          Prior Function            PT Goals (current goals can now be found in the care plan section)  Acute Rehab PT Goals Patient Stated Goal: pt unable to state Progress towards PT goals: Progressing toward goals (out of bed but no command following)    Frequency    Min 1X/week      PT Plan      Co-evaluation PT/OT/SLP Co-Evaluation/Treatment: Yes Reason for Co-Treatment: Complexity of the patient's impairments (multi-system involvement);Necessary to address cognition/behavior during functional activity;To address functional/ADL transfers;For patient/therapist safety PT goals addressed during session: Mobility/safety with mobility;Balance OT goals addressed during session: ADL's and self-care      AM-PAC PT 6 Clicks Mobility   Outcome Measure  Help needed turning from your back to your side while in a flat bed without using bedrails?: Total Help needed moving from lying on your back to sitting on the side of a flat bed without using bedrails?: Total Help needed moving to and from a bed to a chair (including a wheelchair)?: Total Help needed standing up from a chair using your arms (e.g., wheelchair or bedside chair)?: Total Help needed to walk in hospital room?: Total Help needed climbing 3-5 steps with a railing? : Total 6 Click Score: 6    End of Session Equipment Utilized During Treatment: Gait belt Activity Tolerance: Patient tolerated treatment well Patient left: in chair;with call bell/phone within reach;with chair alarm set Nurse Communication: Mobility status;Need for lift equipment PT Visit Diagnosis: Other abnormalities of gait and mobility (R26.89);Muscle weakness (generalized) (M62.81)     Time: 8998-8972 PT Time Calculation (min) (ACUTE ONLY): 26 min  Charges:    $Therapeutic Activity: 8-22 mins PT General Charges $$ ACUTE PT VISIT: 1 Visit                     Bernardino JINNY Ruth, PT, DPT Acute Rehabilitation Office [phone]    Bernardino JINNY Ruth 08/03/2024, 11:23 AM

## 2024-08-03 NOTE — Evaluation (Signed)
 Speech Language Pathology Evaluation Patient Details Name: Daisy [name] MRN: [id number] DOB: April 27, 1956 Today's Date: 08/03/2024 Time: 8959-8884 SLP Time Calculation (min) (ACUTE ONLY): 35 min  Problem List:  Patient Active Problem List   Diagnosis Date Noted   Malnutrition of moderate degree 07/28/2024   Subarachnoid hemorrhage (HCC) 07/26/2024   SIRS (systemic inflammatory response syndrome) (HCC) 07/25/2024   SAH (subarachnoid hemorrhage) (HCC) 07/25/2024   Dyspnea on exertion 09/21/2020   Screening for cholesterol level 09/21/2020   Corneal abrasion 07/23/2011   Elbow pain, left 06/30/2011   Sore throat 02/12/2011   HYPOKALEMIA, MILD 11/04/2008   CARPAL TUNNEL SYNDROME 04/30/2007   Primary hypertension 01/22/2007   RHINITIS, ALLERGIC 01/22/2007   MENOPAUSAL SYNDROME 01/22/2007   Past Medical History:  Past Medical History:  Diagnosis Date   DOE (dyspnea on exertion)    Hypertension    Past Surgical History:  Past Surgical History:  Procedure Laterality Date   CARPAL TUNNEL RELEASE     IR ANGIO INTRA EXTRACRAN SEL INTERNAL CAROTID BILAT MOD SED  07/26/2024   IR ANGIO VERTEBRAL SEL VERTEBRAL UNI L MOD SED  07/26/2024   IR ANGIOGRAM FOLLOW UP STUDY  07/26/2024   IR ANGIOGRAM FOLLOW UP STUDY  07/26/2024   IR ANGIOGRAM FOLLOW UP STUDY  07/26/2024   IR NEURO EACH ADD'L AFTER BASIC UNI LEFT (MS)  07/27/2024   IR TRANSCATH/EMBOLIZ  07/26/2024   RADIOLOGY WITH ANESTHESIA N/A 07/26/2024   Procedure: RADIOLOGY WITH ANESTHESIA;  Surgeon: Lanis Pupa, MD;  Location: [facility] OR;  Service: Radiology;  Laterality: N/A;   HPI:  67 yo female adm 07/25/24 after found on ground with confusion 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 S/p diagnostic cerebral angiogram, coil embolization of ACA aneurysm 9/1. EEG 9/2 suggestive of moderate to severe diffuse encephalopathy. 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 Pt makes eye contact, tracks people and phone screens into all visual fields though head turned left. Pt does not follow commands or verbalize, No response to prosody or facial expression. No effort when presented with simple functional tasks like grasping a cup or a washcloth. Left hand is moving and pt looks at it, but movement is not purposeful. Attention is fleeting and even when tracking her gaze is driven left after several seconds. Will follow for further attempts to facilitate cognition or communication.    SLP Assessment  SLP Recommendation/Assessment: Patient needs continued Speech Language Pathology Services SLP Visit Diagnosis: Cognitive communication deficit (R41.841)     Assistance Recommended at Discharge     Functional Status Assessment    Frequency and Duration min 2x/week  2 weeks      SLP Evaluation Cognition  Overall Cognitive Status: Impaired/Different from baseline Arousal/Alertness: Awake/alert Orientation Level: Other (comment) (nonverbal) Attention: Focused Focused Attention: Impaired Focused Attention Impairment: Verbal basic;Functional basic       Comprehension  Auditory Comprehension Overall Auditory Comprehension: Impaired Yes/No Questions: Impaired Other Yes/No Questions Comments`: Other (comment) (no response to Y/N intonation) Commands: Impaired One Step Basic Commands: 0-24% accurate Interfering Components: Attention    Expression Verbal Expression Overall Verbal Expression: Impaired Initiation: Impaired (does not initiate) Pragmatics: Impairment Impairments: Abnormal affect   Oral / Motor  Oral Motor/Sensory Function Overall Oral Motor/Sensory Function: Other (comment) (doesnt f/c appear WNL) Motor Speech Overall Motor Speech: Impaired (does not articulate)  Sonyia Muro, Consuelo Fitch 08/03/2024, 1:13 PM

## 2024-08-04 ENCOUNTER — Inpatient Hospital Stay (HOSPITAL_COMMUNITY)

## 2024-08-04 DIAGNOSIS — E119 Type 2 diabetes mellitus without complications: Secondary | ICD-10-CM | POA: Diagnosis not present

## 2024-08-04 DIAGNOSIS — I609 Nontraumatic subarachnoid hemorrhage, unspecified: Secondary | ICD-10-CM

## 2024-08-04 DIAGNOSIS — G9340 Encephalopathy, unspecified: Secondary | ICD-10-CM | POA: Diagnosis not present

## 2024-08-04 LAB — CBC
HCT: 35.3 % — ABNORMAL LOW (ref 36.0–46.0)
Hemoglobin: 11.8 g/dL — ABNORMAL LOW (ref 12.0–15.0)
MCH: 29.5 pg (ref 26.0–34.0)
MCHC: 33.4 g/dL (ref 30.0–36.0)
MCV: 88.3 fL (ref 80.0–100.0)
Platelets: 300 K/uL (ref 150–400)
RBC: 4 MIL/uL (ref 3.87–5.11)
RDW: 15.1 % (ref 11.5–15.5)
WBC: 21 K/uL — ABNORMAL HIGH (ref 4.0–10.5)
nRBC: 0.2 % (ref 0.0–0.2)

## 2024-08-04 LAB — BASIC METABOLIC PANEL WITH GFR
Anion gap: 11 (ref 5–15)
BUN: 18 mg/dL (ref 8–23)
CO2: 21 mmol/L — ABNORMAL LOW (ref 22–32)
Calcium: 9.4 mg/dL (ref 8.9–10.3)
Chloride: 109 mmol/L (ref 98–111)
Creatinine, Ser: 0.74 mg/dL (ref 0.44–1.00)
GFR, Estimated: >60 mL/min (ref >60)
Glucose, Bld: 188 mg/dL — ABNORMAL HIGH (ref 70–99)
Potassium: 4 mmol/L (ref 3.5–5.1)
Sodium: 141 mmol/L (ref 135–145)

## 2024-08-04 LAB — GLUCOSE, CAPILLARY
Glucose-Capillary: 117 mg/dL — ABNORMAL HIGH (ref 70–99)
Glucose-Capillary: 125 mg/dL — ABNORMAL HIGH (ref 70–99)
Glucose-Capillary: 142 mg/dL — ABNORMAL HIGH (ref 70–99)
Glucose-Capillary: 145 mg/dL — ABNORMAL HIGH (ref 70–99)
Glucose-Capillary: 147 mg/dL — ABNORMAL HIGH (ref 70–99)
Glucose-Capillary: 159 mg/dL — ABNORMAL HIGH (ref 70–99)
Glucose-Capillary: 159 mg/dL — ABNORMAL HIGH (ref 70–99)

## 2024-08-04 LAB — URINALYSIS, ROUTINE W REFLEX MICROSCOPIC
Bacteria, UA: NONE SEEN
Bilirubin Urine: NEGATIVE
Glucose, UA: NEGATIVE mg/dL
Hgb urine dipstick: NEGATIVE
Ketones, ur: NEGATIVE mg/dL
Leukocytes,Ua: NEGATIVE
Nitrite: NEGATIVE
Protein, ur: 100 mg/dL — AB
Specific Gravity, Urine: 1.018 (ref 1.005–1.030)
pH: 5 (ref 5.0–8.0)

## 2024-08-04 LAB — MRSA NEXT GEN BY PCR, NASAL: MRSA by PCR Next Gen: NOT DETECTED

## 2024-08-04 LAB — TSH: TSH: 0.663 u[IU]/mL (ref 0.350–4.500)

## 2024-08-04 MED ORDER — VANCOMYCIN HCL 1250 MG/250ML IV SOLN
1250.0000 mg | INTRAVENOUS | Status: DC
  Administered 2024-08-04 – 2024-08-05 (×2): 1250 mg via INTRAVENOUS
  Filled 2024-08-04 (×2): qty 250

## 2024-08-04 MED ORDER — PIPERACILLIN-TAZOBACTAM 3.375 G IVPB
3.3750 g | Freq: Three times a day (TID) | INTRAVENOUS | Status: DC
  Administered 2024-08-04 – 2024-08-06 (×7): 3.375 g via INTRAVENOUS
  Filled 2024-08-04 (×7): qty 50

## 2024-08-04 MED ORDER — ONDANSETRON HCL 4 MG/2ML IJ SOLN: 4.0000 mg | Freq: Four times a day (QID) | INTRAMUSCULAR | Status: DC | PRN

## 2024-08-04 MED ORDER — HYDRALAZINE HCL 20 MG/ML IJ SOLN
5.0000 mg | INTRAMUSCULAR | Status: DC | PRN
  Administered 2024-08-04: 5 mg via INTRAVENOUS
  Filled 2024-08-04: qty 1

## 2024-08-04 MED ORDER — ACETAMINOPHEN 160 MG/5ML PO SOLN
650.0000 mg | ORAL | Status: DC | PRN
  Administered 2024-08-04 – 2024-08-05 (×5): 650 mg
  Filled 2024-08-04 (×5): qty 20.3

## 2024-08-04 MED ORDER — ONDANSETRON HCL 4 MG/2ML IJ SOLN
INTRAMUSCULAR | Status: AC
  Administered 2024-08-04: 4 mg
  Filled 2024-08-04: qty 2

## 2024-08-04 NOTE — Procedures (Signed)
 Arterial Catheter Insertion Procedure Note  Daisy [name]  [id number]  Aug 07, 1956  Date:08/04/24  Time:9:55 AM    Provider Performing: Tinnie FORBES Furth    Procedure: Insertion of Arterial Line (63379) with US  guidance (23062)   Indication(s) Blood pressure monitoring and/or need for frequent ABGs  Consent Risks of the procedure as well as the alternatives and risks of each were explained to the patient and/or caregiver.  Consent for the procedure was obtained and is signed in the bedside chart  Anesthesia None   Time Out Verified patient identification, verified procedure, site/side was marked, verified correct patient position, special equipment/implants available, medications/allergies/relevant history reviewed, required imaging and test results available.   Sterile Technique Maximal sterile technique including full sterile barrier drape, hand hygiene, sterile gown, sterile gloves, mask, hair covering, sterile ultrasound probe cover (if used).   Procedure Description Area of catheter insertion was cleaned with chlorhexidine  and draped in sterile fashion. With real-time ultrasound guidance an arterial catheter was placed into the right radial artery.  Appropriate arterial tracings confirmed on monitor.     Complications/Tolerance None; patient tolerated the procedure well.   EBL Minimal   Specimen(s) None   Tinnie FORBES Furth, PA-C  Pulmonary & Critical Care 08/04/24 9:56 AM  Please see [email or web address] for pager details.  From 7A-7P if no response, please call [phone] After hours, please call ELink [phone]

## 2024-08-04 NOTE — Progress Notes (Signed)
 NAME:  Daisy [name], MRN:  [id number], DOB:  05-Mar-1956, LOS: 10 ADMISSION DATE:  07/25/2024, CONSULTATION DATE:  07/25/2024 REFERRING MD: Darnella - NSGY, CHIEF COMPLAINT:  Sepsis/SAH   History of Present Illness:  68 year old woman who presented to [facility] 8/31 for AMS and not feel well x 1 week. PMHx significant for HTN, COPD (not on home O2), DMT2, vitamin D deficiency, GERD, chronic back pain, EtOH abuse, polysubstance abuse.  Per report, patient was confused and feeling unwell x 1 week and was found down at home. Patient's son called 911. EMS found patient on the ground and drenched in urine with tachycardia and temp 99.61F. Patient was reportedly more confused on day of admission and had been urinating frequently and on herself and having headaches.  She drinks 2-6 beers daily and uses cocaine (last 1 week PTA).  On ED arrival, labs were notable for K 2.6, AG 17, LA 3--->5.9, WBC 17.8. Working diagnosis was sepsis and patient was started on antibiotics.  Due to confusion, CT Head performed demonstrating SAH.  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  CT chest/abd/pelvis did not reveal any acute issues. Transferred to [facility] for further care.  PCCM consulted.  Pertinent Medical History:  HTN, COPD (not on home O2), DMT2, Vitamin D deficiency, GERD, chronic back pain, EtOH abuse, polysubstance abuse   Significant Hospital Events: Including procedures, antibiotic start and stop dates in addition to other pertinent events   8/31 - Transferred to [facility] from [facility] acute SAH d/t ruptured Acomm and associated Hydrocephalus 9/1 - Underwent Acomm aneurysm coiling (NSGY) 9/2 Per RN, decreased responsiveness since coiling; prior to intervention was alert and oriented to self, talking/following commands. Spot EEG negative for seizures. Permissive HTN, off of Cleviprex  per NSGY.  Echocardiogram showed  hyperechoic mobile density in the anterior left hip of the mitral valve.  LVEF 60 to 65% normal RV function. Mental status declined on 9/1 with worsening lethargy no longer following commands.  CT was initially negative for change, EEG has been negative for seizure.  BP parameters changed to make goal 180-200 for permissive hypertension on 9/2.  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  Mental status continues to be poor.  Cultures still negative. 9/6: Tracking today.  Saying ouch to painful stimuli. 9/9: stable. Still on levo/vaso for SBP >160  Interim History / Subjective:  Fever to 102 overnight. Night elink obtained repeat cxr and placed her on vancomycin /zosyn  for now. Levo requirements down this morning. Placed a new a line. EVD removed.  Objective:   Blood pressure (!) 163/87, pulse (!) 117, temperature 100 F (37.8 C), temperature source Axillary, resp. rate (!) 22, height 5' 6 (1.676 m), weight 59.3 kg, SpO2 97%.        Intake/Output Summary (Last 24 hours) at 08/04/2024 0957 Last data filed at 08/04/2024 0900 Gross per 24 hour  Intake 2326.15 ml  Output 2380 ml  Net -53.85 ml   Filed Weights   08/03/24 0500 08/04/24 0515  Weight: 59 kg 59.3 kg   Physical Examination: General:awake and alert, acute on chronically ill appearing  Lungs: Clear to auscultation bilaterally  Heart:s1s2 no murmur, rub, gallop Abdomen: rounded, ntnd, +BS Neuro: awake, opens eyes to voice and moves eyes toward voice but does not make eye contact or track me. No verbal response to painful stimulus today. Delayed dorsiflexion to painful stimulus. No movement of upper extremities     Resolved Problem List:  Lactic acidosis resolved 9/1 SIRS: off abx.   Assessment and Plan:  aSAH due to ruptured ACOM aneurysm status post coiling, associated hydrocephalus status post EVD Acute encephalopathy due to [facility] CTA 9/2: Showing left acute infarct which is new along with vasospasm in ACA, R MCA, left PCA territory.  Prior SAH, IVH and ventricular enlargement have improved. MRI done showed large left ACA acute infarct without midline shift and other multiple vascular territory infarct. Repeat head CT 9/10 with scattered bilateral brain infarcts, stable bleed  - Continue nimodipine .  - SBP goal160-200. Currently on levophed  and vasopressin  to maintain SBP. Will not add 3rd pressor. Levo weaning today  - midodrine  10mg  TID  - EVD removed this morning  -TCD's per protocol -Neurosurgery following - keppra  7 day course completed 9/9 -Maintain normal sodium levels.   Fever  Leukocytosis  Suspect fever is neuro related. Procal 9/8 negative. Not on culprit meds. Unclear if her leukocytosis is related to high dose pressors or infection. CXR does not demonstrate a pneumonia. EVD was removed 9/10.  - repeat cbc, blood cultures, ua  - will not remove picc until we are off pressor support  - no foley present  - con't vanc/zosyn  that was started 9/10 early AM for now, can deescalate after MRSA PCR returns   Sinus tachycardia  Suspect this is neuro related. 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  Blood cultures have been negative.  Presented with elevated lactate but now normal. Per NSGY, aneurysm position less likely to be secondary to mycotic aneurysm.  - query multiple infarcts on CT related to possible vegetation  - TEE low yield  - cardiology deferred TEE until patient has improved    History of COPD  Per family no  history of smoking.  However reportedly has COPD. - duonebs prn   History of ETOH use Polysubstance abuse, cocaine  Chronic pain  - Continue thiamine , folate, multivitamin supplementation - Seizure precautions  History of HTN: - home regimen on hold with need for pressors for SBP goal   DMT2:  - Continue SSI, every 4 hours - CBG goal 140-180  Physical deconditioning: At risk for malnutrition: - PT/OT/SLP - Continue tube feeds via cortrak - protein supplementation, multivitamin, thiamine , folic acid   CC time: 32 Tinnie FORBES Furth, PA-C [facility] 08/04/24 9:57 AM  Please see [email or web address] for pager details.  From 7A-7P if no response, please call [phone] After hours, please call ELink [phone]

## 2024-08-04 NOTE — Progress Notes (Signed)
 Speech Language Pathology Treatment: Dysphagia;Cognitive-Linguistic  Patient Details Name: VIRGIE KUNDA MRN: [id number] DOB: 03-12-1956 Today's Date: 08/04/2024 Time: 8871-8860 SLP Time Calculation (min) (ACUTE ONLY): 11 min  Assessment / Plan / Recommendation Clinical Impression  Pt with abnormal respiratory pattern today, intermittently panting and grimacing. SLP completed oral care and pt manipulated the sponge and seemed to appreciate removal of dry secretions. When offered a cup of water  with a straw pt took two small sips, no coughing, but did not take further sips if offered. SLP attempted to elicit some response with exaggerated Y/N intonation. Pt did seem to shake her head 'no' once, very minimally. Not interested in PO enough for instrumental testing today, will f/u.    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 S/p diagnostic cerebral angiogram, coil embolization of ACA aneurysm 9/1. EEG 9/2 suggestive of moderate to severe diffuse encephalopathy. PMHx: HTN, polysubstance abuse, chronic back pain, T2DM, COPD      SLP Plan  Continue with current plan of care          Recommendations  Diet recommendations: NPO                              Continue with current plan of care     Isauro Skelley, Consuelo Fitch  08/04/2024, 12:43 PM

## 2024-08-04 NOTE — Progress Notes (Signed)
 Transcranial Doppler   Date POD PCO2 HCT BP   MCA ACA PCA OPHT SIPH VERT Basilar  9/2 JH 1   36.7 152/94 Right  Left   *  *   *  *   *  *   16  10   *  22   -23  -23   -31        9/3 JH 2    36.3  158/107  Right  Left    *   *    *   *    *   *    10   13    25   19     -19   -16    *   *     9/5 RS         Right  Left    *   *    *   *    *   *    20   19    *   42    -22   -28    -29        9/8 CK          Right  Left    *   *    *   *    *   *    20   19    *   56    *   *    *   *    9/10 JH    9    35.3  138/87  Right  Left    *   *    *   *    *   *    12   13    19    36    -15   -23    -24                 Right  Left                                                               Right  Left                                                       MCA = Middle Cerebral Artery      OPHT = Opthalmic Artery     BASILAR = Basilar Artery   ACA = Anterior Cerebral Artery     SIPH = Carotid Siphon PCA = Posterior Cerebral Artery   VERT = Verterbral Artery                    Normal MCA = 62+\-12 ACA = 50+\-12 PCA = 42+\-23         (*) Not insonated -Poor transtemporal windows.    Results can be found under chart review under CV PROC. 08/04/2024 5:28 PM Kean Gautreau RVT, RDMS

## 2024-08-04 NOTE — TOC Progression Note (Signed)
 Transition of Care [facility]) - Progression Note    Patient Details  Name: Daisy [name] MRN: [id number] Date of Birth: 1956/05/19  Transition of [facility]) CM/SW Contact  Inocente GORMAN Kindle, LCSW Phone Number: 08/04/2024, 11:54 AM  Clinical Narrative:    CSW continuing to follow for medical progression.    Expected Discharge Plan: Skilled Nursing Facility Barriers to Discharge: Continued Medical Work up, English as a second language teacher, SNF Pending bed offer               Expected Discharge Plan and Services In-house Referral: Clinical Social Work     Living arrangements for the past 2 months: Single Family Home                                       Social Drivers of Health (SDOH) Interventions SDOH Screenings   Tobacco Use: Low Risk  (07/26/2024)    Readmission Risk Interventions     No data to display

## 2024-08-04 NOTE — Progress Notes (Addendum)
  NEUROSURGERY PROGRESS NOTE   Pt seen and examined. No issues overnight  EXAM: Temp:  [98.1 F (36.7 C)-102.9 F (39.4 C)] 100 F (37.8 C) (09/10 0700) Pulse Rate:  [97-146] 117 (09/10 0900) Resp:  [19-60] 22 (09/10 0900) BP: (128-209)/(80-120) 163/87 (09/10 0900) SpO2:  [91 %-100 %] 97 % (09/10 0900) Arterial Line BP: (88-205)/(73-146) 168/78 (09/10 0900) Weight:  [59.3 kg] 59.3 kg (09/10 0515) Intake/Output      09/09 0701 09/10 0700 09/10 0701 09/11 0700   I.V. (mL/kg) 918.3 (15.5) 83.5 (1.4)   NG/GT 1122.5 100   IV Piggyback 294.5 5.2   Total Intake(mL/kg) 2335.3 (39.4) 188.7 (3.2)   Urine (mL/kg/hr) 2405 (1.7)    Emesis/NG output 0    Drains 0    Stool 100    Total Output 2505    Net -169.7 +188.7        Emesis Occurrence 1 x     Eyes open spontaneously Occasionally tracks Occasionally says ouch to central pain Not following commands Localizing LUE, minimal w/d RUE. Minimal w/d BLE Right groin site soft EVD in place clamped.  LABS: Lab Results  Component Value Date   CREATININE 0.50 08/03/2024   BUN 15 08/03/2024   NA 138 08/03/2024   K 3.9 08/03/2024   CL 104 08/03/2024   CO2 20 (L) 08/03/2024   Lab Results  Component Value Date   WBC 18.8 (H) 08/03/2024   HGB 12.1 08/03/2024   HCT 34.8 (L) 08/03/2024   MCV 86.4 08/03/2024   PLT 237 08/03/2024   IMAGING: CTH this am reveals stable appearance of SAH and known LACA territory stroke. Ventricular size is unchanged in comparison to previous MRI when EVD was draining.  IMPRESSION: - 68 y.o. female SAH d# 10 s/p coil embolization of Acom aneurysm. Remains obtunded after coiling which did reveal significant ACA spasm. Spot EEG negative. Left ACA territory stroke and multiple smaller embolic strokes on recent MRI - Vasospasm seen on initial angiogram, TCD with poor sonographic windows. - TTE reveals possible mitral vegetation, no plan for further eval currently. - HCP appears to be resolved. - Fevers  of unclear origin, likely central  PLAN: - EVD removed at bedside without complication - Cont with hyperdynamic treatment for goal SBP >168mmHg if possible. I think a reasonable plan would be to cont to augment BP for the rest of the week, then wean off while monitoring neurologic exam. - Cont Nimotop  split Q2 - TCD today - Cont empiric abx for now per PCCM   Gerldine Maizes, MD [facility]

## 2024-08-04 NOTE — Progress Notes (Signed)
 Pharmacy Antibiotic Note  Daisy [name] is a 68 y.o. female admitted on 07/25/2024 with possible aspiration pneumonia. Patient is febrile to 102.9, WBC 18.  Pharmacy has been consulted for vancomycin , piperacillin /tazobactam  dosing.  9/10 Vancomycin  1250mg  Q 24 hr with Est AUC: 506 Scr used: 0.8 mg/dL; Vd coeff: 0.72 L/kg  Plan: Vancomycin  1250mg  q24hr Piperacillin /tazobactam 3.375g Q8hr EI Monitor cultures, clinical status, renal function, vancomycin  level Narrow abx as able and f/u duration    Height: 5' 6 (167.6 cm) Weight: 59 kg (130 lb 1.1 oz) IBW/kg (Calculated) : 59.3  Temp (24hrs), Avg:100.4 F (38 C), Min:98.1 F (36.7 C), Max:102.9 F (39.4 C)  Recent Labs  Lab 07/29/24 0416 07/30/24 0502 07/30/24 1237 07/31/24 0403 07/31/24 0404 08/01/24 0517 08/02/24 0939 08/03/24 0549  WBC 13.1*  --  18.5*  --  17.9*  --  18.4* 18.8*  CREATININE 0.59 0.54  --  0.51  --  0.59 0.49 0.50    Estimated Creatinine Clearance: 64.4 mL/min (by C-G formula based on SCr of 0.5 mg/dL).    Allergies  Allergen Reactions   Codeine Other (See Comments)    mental status change    Antimicrobials this admission: CTX  8/31 >> 9/6 vanc 8/31 >> 9/2, 9/10 >>  Piptazo 9/10 >>   Dose adjustments this admission: N/a  Microbiology results: 8/31 BCx: ngtd  8/31 MRSA PCR: neg  Thank you for allowing pharmacy to be a part of this patient's care.  Jinnie Door, PharmD, BCPS, BCCP Clinical Pharmacist  Please check AMION for all [facility] phone numbers After 10:00 PM, call Main Pharmacy [phone]

## 2024-08-04 NOTE — Progress Notes (Signed)
 Afternoon round: stable exam. EVD out this morning. She is now completely off of levophed . Only on vasopressin  to maintain her SBP > 160 per NSGY. Will wean vaso next and then midodrine  once she is off drips. Continue vanc/zosyn  . Can de-escalate Vancomycin  tomorrow likely with negative MRSA PCR. F/u blood cultures and trend wbc,fever curve.   Tinnie FORBES Furth, PA-C [facility] 08/04/24 5:04 PM  Please see [email or web address] for pager details.  From 7A-7P if no response, please call [phone] After hours, please call ELink [phone]

## 2024-08-05 DIAGNOSIS — R509 Fever, unspecified: Secondary | ICD-10-CM

## 2024-08-05 LAB — GLUCOSE, CAPILLARY
Glucose-Capillary: 100 mg/dL — ABNORMAL HIGH (ref 70–99)
Glucose-Capillary: 149 mg/dL — ABNORMAL HIGH (ref 70–99)
Glucose-Capillary: 155 mg/dL — ABNORMAL HIGH (ref 70–99)
Glucose-Capillary: 157 mg/dL — ABNORMAL HIGH (ref 70–99)
Glucose-Capillary: 164 mg/dL — ABNORMAL HIGH (ref 70–99)
Glucose-Capillary: 176 mg/dL — ABNORMAL HIGH (ref 70–99)

## 2024-08-05 LAB — CBC
HCT: 34.1 % — ABNORMAL LOW (ref 36.0–46.0)
Hemoglobin: 11.5 g/dL — ABNORMAL LOW (ref 12.0–15.0)
MCH: 29.7 pg (ref 26.0–34.0)
MCHC: 33.7 g/dL (ref 30.0–36.0)
MCV: 88.1 fL (ref 80.0–100.0)
Platelets: 263 K/uL (ref 150–400)
RBC: 3.87 MIL/uL (ref 3.87–5.11)
RDW: 15.6 % — ABNORMAL HIGH (ref 11.5–15.5)
WBC: 17 K/uL — ABNORMAL HIGH (ref 4.0–10.5)
nRBC: 0.2 % (ref 0.0–0.2)

## 2024-08-05 LAB — BASIC METABOLIC PANEL WITH GFR
Anion gap: 14 (ref 5–15)
BUN: 23 mg/dL (ref 8–23)
CO2: 18 mmol/L — ABNORMAL LOW (ref 22–32)
Calcium: 9.3 mg/dL (ref 8.9–10.3)
Chloride: 115 mmol/L — ABNORMAL HIGH (ref 98–111)
Creatinine, Ser: 0.72 mg/dL (ref 0.44–1.00)
GFR, Estimated: >60 mL/min (ref >60)
Glucose, Bld: 129 mg/dL — ABNORMAL HIGH (ref 70–99)
Potassium: 3.6 mmol/L (ref 3.5–5.1)
Sodium: 147 mmol/L — ABNORMAL HIGH (ref 135–145)

## 2024-08-05 MED ORDER — POTASSIUM CHLORIDE 20 MEQ PO PACK
40.0000 meq | PACK | Freq: Once | ORAL | Status: AC
  Administered 2024-08-05: 40 meq
  Filled 2024-08-05: qty 2

## 2024-08-05 MED ORDER — ACETAMINOPHEN 160 MG/5ML PO SOLN: 650.0000 mg | ORAL | Status: DC | PRN

## 2024-08-05 MED ORDER — ACETAMINOPHEN 160 MG/5ML PO SOLN
650.0000 mg | ORAL | Status: DC | PRN
  Administered 2024-08-05 – 2024-08-06 (×4): 650 mg
  Filled 2024-08-05 (×4): qty 20.3

## 2024-08-05 MED ORDER — NIMODIPINE 6 MG/ML PO SOLN
60.0000 mg | Freq: Once | ORAL | Status: AC
  Administered 2024-08-05: 60 mg

## 2024-08-05 MED ORDER — NIMODIPINE 6 MG/ML PO SOLN: 60.0000 mg | Freq: Once | ORAL | Status: DC

## 2024-08-05 MED ORDER — NIMODIPINE 6 MG/ML PO SOLN: 60.0000 mg | ORAL | Status: DC

## 2024-08-05 MED ORDER — NIMODIPINE 6 MG/ML PO SOLN
30.0000 mg | ORAL | Status: DC
  Administered 2024-08-05 – 2024-08-13 (×97): 30 mg
  Filled 2024-08-05 (×4): qty 10
  Filled 2024-08-05: qty 5
  Filled 2024-08-05 (×26): qty 10
  Filled 2024-08-05: qty 5
  Filled 2024-08-05 (×4): qty 10
  Filled 2024-08-05 (×2): qty 5
  Filled 2024-08-05: qty 10
  Filled 2024-08-05: qty 5
  Filled 2024-08-05: qty 10
  Filled 2024-08-05: qty 5
  Filled 2024-08-05 (×12): qty 10
  Filled 2024-08-05: qty 5
  Filled 2024-08-05 (×3): qty 10
  Filled 2024-08-05 (×3): qty 5
  Filled 2024-08-05: qty 10
  Filled 2024-08-05: qty 5
  Filled 2024-08-05: qty 10
  Filled 2024-08-05: qty 5
  Filled 2024-08-05: qty 10
  Filled 2024-08-05: qty 5
  Filled 2024-08-05 (×4): qty 10
  Filled 2024-08-05: qty 5
  Filled 2024-08-05 (×3): qty 10

## 2024-08-05 MED ORDER — NIMODIPINE 6 MG/ML PO SOLN
30.0000 mg | ORAL | Status: DC
Start: 2024-08-05 — End: 2024-08-05

## 2024-08-05 MED ORDER — NIMODIPINE 30 MG PO CAPS: 60.0000 mg | ORAL_CAPSULE | ORAL | Status: DC

## 2024-08-05 MED ORDER — FREE WATER: 200.0000 mL | Freq: Four times a day (QID) | Status: DC

## 2024-08-05 MED ORDER — VANCOMYCIN HCL 1250 MG/250ML IV SOLN
1250.0000 mg | INTRAVENOUS | Status: DC
  Administered 2024-08-06: 1250 mg via INTRAVENOUS
  Filled 2024-08-05: qty 250

## 2024-08-05 MED ORDER — NIMODIPINE 30 MG PO CAPS
60.0000 mg | ORAL_CAPSULE | ORAL | Status: DC
Start: 2024-08-05 — End: 2024-08-05

## 2024-08-05 MED ORDER — FREE WATER
200.0000 mL | Freq: Four times a day (QID) | Status: DC
  Administered 2024-08-05 – 2024-08-06 (×4): 200 mL

## 2024-08-05 MED ORDER — NIMODIPINE 30 MG PO CAPS
30.0000 mg | ORAL_CAPSULE | ORAL | Status: DC
  Filled 2024-08-05 (×13): qty 1

## 2024-08-05 NOTE — Plan of Care (Signed)
  Problem: Fluid Volume: Goal: Hemodynamic stability will improve Outcome: Progressing   Problem: Respiratory: Goal: Ability to maintain adequate ventilation will improve Outcome: Progressing   

## 2024-08-05 NOTE — TOC Progression Note (Signed)
 Transition of Care [facility]) - Progression Note    Patient Details  Name: Daisy [name] MRN: [id number] Date of Birth: 1956/05/10  Transition of Care [facility]) CM/SW Contact  Inocente GORMAN Kindle, LCSW Phone Number: 08/05/2024, 10:12 AM  Clinical Narrative:    CSW continuing to follow for medical stability.    Expected Discharge Plan: Skilled Nursing Facility Barriers to Discharge: Continued Medical Work up, English as a second language teacher, SNF Pending bed offer               Expected Discharge Plan and Services In-house Referral: Clinical Social Work     Living arrangements for the past 2 months: Single Family Home                                       Social Drivers of Health (SDOH) Interventions SDOH Screenings   Tobacco Use: Low Risk  (07/26/2024)    Readmission Risk Interventions     No data to display

## 2024-08-05 NOTE — Progress Notes (Signed)
 Pharmacy Electrolyte Replacement  Recent Labs:  Recent Labs    08/05/24 0537  K 3.6  CREATININE 0.72    Low Critical Values (K </= 2.5, Phos </= 1, Mg </= 1) Present: None  Plan: Give KCL 40 meq per tube.   Gearline Spilman, PharmD

## 2024-08-05 NOTE — Plan of Care (Signed)
  Problem: Respiratory: Goal: Ability to maintain adequate ventilation will improve Outcome: Progressing   Problem: Nutrition: Goal: Adequate nutrition will be maintained Outcome: Progressing   Problem: Elimination: Goal: Will not experience complications related to urinary retention Outcome: Progressing   Problem: Nutritional: Goal: Maintenance of adequate nutrition will improve Outcome: Progressing   Problem: Fluid Volume: Goal: Hemodynamic stability will improve Outcome: Not Progressing   Problem: Clinical Measurements: Goal: Signs and symptoms of infection will decrease Outcome: Not Progressing   Problem: Clinical Measurements: Goal: Ability to maintain clinical measurements within normal limits will improve Outcome: Not Progressing   Problem: Elimination: Goal: Will not experience complications related to bowel motility Outcome: Not Progressing

## 2024-08-05 NOTE — Progress Notes (Signed)
 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 S/p diagnostic cerebral angiogram, coil embolization of ACA aneurysm 9/1. EEG 9/2 suggestive of moderate to severe diffuse encephalopathy. PMHx: HTN, polysubstance abuse, chronic back pain, T2DM, COPD   OT comments  Patient with dependent transfer to recliner.  Did verbalize ouch with side lying to sit.  OT reduced frequency to 1x/wk.  Resting hand splint to be ordered to L hand.  skilled nursing facility.  Possible rehab attempt, possible LTC.      If plan is discharge home, recommend the following:  Two people to help with walking and/or transfers;Two people to help with bathing/dressing/bathroom;Assistance with cooking/housework;Assistance with feeding;Direct supervision/assist for medications management;Direct supervision/assist for financial management;Assist for transportation;Help with stairs or ramp for entrance;Supervision due to cognitive status   Equipment Recommendations  None recommended by OT    Recommendations for Other Services      Precautions / Restrictions Precautions Precautions: Fall Recall of Precautions/Restrictions: Impaired Precaution/Restrictions Comments: permissive HTN given vasospasm, no set amount but RN states <200 SBP. EVD clamped during session. Rectal pouch Restrictions Weight Bearing Restrictions Per Provider Order: No       Mobility Bed Mobility Overal bed mobility: Needs Assistance Bed Mobility: Sidelying to Sit Rolling: Total assist Sidelying to sit: Total assist            Transfers Overall transfer level: Needs assistance Equipment used: 1 person hand held assist Transfers: Bed to chair/wheelchair/BSC     Squat pivot transfers: Total assist, +2 safety/equipment              Balance Overall balance assessment: Needs assistance Sitting-balance support: No upper extremity supported, Feet unsupported Sitting balance-Leahy Scale: Zero                                     ADL either performed or assessed with clinical judgement   ADL   Eating/Feeding: NPO   Grooming: Total assistance                                      Extremity/Trunk Assessment Upper Extremity Assessment RUE Deficits / Details: no command following, PROM WFL RUE Sensation: decreased light touch;decreased proprioception LUE Deficits / Details: incr tone to elbow and shoulder,resting hand splint ordered 9/11 LUE Sensation: decreased light touch;decreased proprioception LUE Coordination: decreased fine motor;decreased gross motor   Lower Extremity Assessment Lower Extremity Assessment: Defer to PT evaluation        Vision   Vision Assessment?: Vision impaired- to be further tested in functional context   Perception Perception Perception: Not tested   Praxis Praxis Praxis: Not tested   Communication Communication Communication: Impaired Factors Affecting Communication: Difficulty expressing self   Cognition Arousal: Alert Behavior During Therapy: Flat affect Cognition: Difficult to assess Difficult to assess due to: Impaired communication           OT - Cognition Comments: No command following noted                 Following commands: Impaired        Cueing   Cueing Techniques: Gestural cues, Verbal cues, Tactile cues, Visual cues  Exercises  Shoulder Instructions       General Comments      Pertinent Vitals/ Pain       Pain Assessment Pain Assessment: Faces Faces Pain Scale: Hurts little more Pain Location: Said ouch with supine to sit Pain Intervention(s): Monitored during session                                                          Frequency  Min 1X/week        Progress  Toward Goals  OT Goals(current goals can now be found in the care plan section)  Progress towards OT goals: Not progressing toward goals - comment  Acute Rehab OT Goals OT Goal Formulation: Patient unable to participate in goal setting Time For Goal Achievement: 08/10/24 Potential to Achieve Goals: Fair  Plan      Co-evaluation                 AM-PAC OT 6 Clicks Daily Activity     Outcome Measure   Help from another person eating meals?: Total Help from another person taking care of personal grooming?: Total Help from another person toileting, which includes using toliet, bedpan, or urinal?: Total Help from another person bathing (including washing, rinsing, drying)?: Total Help from another person to put on and taking off regular upper body clothing?: Total Help from another person to put on and taking off regular lower body clothing?: Total 6 Click Score: 6    End of Session Equipment Utilized During Treatment: Gait belt  OT Visit Diagnosis: Unsteadiness on feet (R26.81);Other abnormalities of gait and mobility (R26.89);Muscle weakness (generalized) (M62.81);History of falling (Z91.81)   Activity Tolerance Patient tolerated treatment well   Patient Left in chair;with call bell/phone within reach;with chair alarm set   Nurse Communication Mobility status        Time: 9098-9078 OT Time Calculation (min): 20 min  Charges: OT General Charges $OT Visit: 1 Visit OT Treatments $Therapeutic Activity: 8-22 mins  08/05/2024  RP, OTR/L  Acute Rehabilitation Services  Office:  [phone]   Daisy [name] 08/05/2024, 9:37 AM

## 2024-08-05 NOTE — Progress Notes (Signed)
   08/05/24 0134  Vitals  Pulse Rate (!) 132  ECG Heart Rate (!) 133  Resp (!) 42  Oxygen Therapy  SpO2 96 %  Art Line  Arterial Line BP 232/91 (see Notes)  Arterial Line MAP (mmHg) 124 mmHg  MEWS Score  MEWS Temp 0  MEWS Systolic 0  MEWS Pulse 3  MEWS RR 3  MEWS LOC 1  MEWS Score 7  MEWS Score Color Red  Provider Notification  Provider Name/Title [facility] answering service  Date Provider Notified 08/05/24  Time Provider Notified 0134  Method of Notification Call (Page to Dr. Gillie)  Notification Reason Change in status;Other (Comment) (SBP continually above parameters, increased HR)  Provider response See new orders  Date of Provider Response 08/05/24  Time of Provider Response 0139   Patient's SBP continuously above parameters of 160-200 mmHg at this time, with systolic in the 220s and HR increased to 130s. PRN given for pain with no improvement in SBP or HR. Patient's most recent temperature 98.8 orally at 0100. Previously, patient's BP very labile having been both below and above goals, and on and off of pressors.   In prior shifts, patient's temperature would be elevated when she started having increased HR and irregular breathing pattern of tachypnea followed by more relaxed respirations.   Patient's neurological status remains unchanged.   Dr. Gillie notified and verbal orders to give regular dose of 60 of Nimotop  received. One time order placed for 0200 dose. Next scheduled doses will be re-timed based on this. Pharmacy also notified.   Patient's temperature rechecked at 0200, febrile to 102.5 axillary, and PRN tylenol  given.

## 2024-08-05 NOTE — Progress Notes (Signed)
  NEUROSURGERY PROGRESS NOTE   Pt seen and examined. No issues overnight  EXAM: Temp:  [98.6 F (37 C)-102.5 F (39.2 C)] 101.9 F (38.8 C) (09/11 1124) Pulse Rate:  [105-133] 118 (09/11 1230) Resp:  [16-60] 34 (09/11 0915) BP: (123-205)/(68-126) 194/97 (09/11 1230) SpO2:  [92 %-99 %] 98 % (09/11 1230) Arterial Line BP: (122-232)/(60-108) 173/79 (09/11 1230) Weight:  [59.9 kg] 59.9 kg (09/11 0500) Intake/Output      09/10 0701 09/11 0700 09/11 0701 09/12 0700   I.V. (mL/kg) 337.6 (5.6) 24 (0.4)   NG/GT 1250 150   IV Piggyback 383.9 21.5   Total Intake(mL/kg) 1971.5 (32.9) 195.6 (3.3)   Urine (mL/kg/hr) 1000 (0.7)    Emesis/NG output     Drains     Stool 100    Total Output 1100    Net +871.5 +195.6        Stool Occurrence 1 x     Eyes open spontaneously Occasionally tracks Occasionally says ouch to central pain Not following commands Localizing LUE, minimal w/d RUE. Minimal w/d BLE Right groin site soft  LABS: Lab Results  Component Value Date   CREATININE 0.72 08/05/2024   BUN 23 08/05/2024   NA 147 (H) 08/05/2024   K 3.6 08/05/2024   CL 115 (H) 08/05/2024   CO2 18 (L) 08/05/2024   Lab Results  Component Value Date   WBC 17.0 (H) 08/05/2024   HGB 11.5 (L) 08/05/2024   HCT 34.1 (L) 08/05/2024   MCV 88.1 08/05/2024   PLT 263 08/05/2024    IMPRESSION: - 68 y.o. female SAH d# 11 s/p coil embolization of Acom aneurysm. Remains obtunded after coiling which did reveal significant ACA spasm. Spot EEG negative. Left ACA territory stroke and multiple smaller embolic strokes on recent MRI - Vasospasm seen on initial angiogram, TCD with poor sonographic windows, largely unhelpful. - TTE reveals possible mitral vegetation, no plan for further eval currently. - HCP appears to be resolved. - Fevers of unclear origin, likely central.   PLAN: - Cont with hyperdynamic treatment for goal SBP >154mmHg if possible. Can begin to wean off pressors, relax SBP goal  starting tomorrow while monitoring neurologic exam. - Cont Nimotop  split Q2 - Cont empiric abx for now per PCCM, de-escalating with negative cultures   Gerldine Maizes, MD [facility]

## 2024-08-05 NOTE — Progress Notes (Signed)
 NAME:  Daisy [name], MRN:  [id number], DOB:  Sep 03, 1956, LOS: 11 ADMISSION DATE:  07/25/2024, CONSULTATION DATE:  07/25/2024 REFERRING MD: Darnella - NSGY, CHIEF COMPLAINT:  Sepsis/SAH   History of Present Illness:  68 year old woman who presented to [facility] 8/31 for AMS and not feel well x 1 week. PMHx significant for HTN, COPD (not on home O2), DMT2, vitamin D deficiency, GERD, chronic back pain, EtOH abuse, polysubstance abuse.  Per report, patient was confused and feeling unwell x 1 week and was found down at home. Patient's son called 911. EMS found patient on the ground and drenched in urine with tachycardia and temp 99.50F. Patient was reportedly more confused on day of admission and had been urinating frequently and on herself and having headaches.  She drinks 2-6 beers daily and uses cocaine (last 1 week PTA).  On ED arrival, labs were notable for K 2.6, AG 17, LA 3--->5.9, WBC 17.8. Working diagnosis was sepsis and patient was started on antibiotics.  Due to confusion, CT Head performed demonstrating SAH.  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  CT chest/abd/pelvis did not reveal any acute issues. Transferred to [facility] for further care.  PCCM consulted  Pertinent Medical History:  HTN, COPD (not on home O2), DMT2, Vitamin D deficiency, GERD, chronic back pain, EtOH abuse, polysubstance abuse   Significant Hospital Events: Including procedures, antibiotic start and stop dates in addition to other pertinent events   8/31 - Transferred to [facility] ICU from [facility] acute SAH d/t ruptured Acomm and associated Hydrocephalus 9/1 - Underwent Acomm aneurysm coiling (NSGY) 9/2 Per RN, decreased responsiveness since coiling; prior to intervention was alert and oriented to self, talking/following commands. Spot EEG negative for seizures. Permissive HTN, off of Cleviprex  per NSGY.  Echocardiogram showed  hyperechoic mobile density in the anterior left hip of the mitral valve.  LVEF 60 to 65% normal RV function. Mental status declined on 9/1 with worsening lethargy no longer following commands.  CT was initially negative for change, EEG has been negative for seizure.  BP parameters changed to make goal 180-200 for permissive hypertension on 9/2.  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  Mental status continues to be poor.  Cultures still negative. 9/6: Tracking today.  Saying ouch to painful stimuli. 9/9: stable. Still on levo/vaso for SBP >160 9/10 EVD removed. Wide BP swings. Intermittent fevers. Max 102.5.  Interim History / Subjective:  Off pressors yesterday, but then became hypertensive requiring PRN anti-hypertensive dosing. Then BP dropped quite a bit requiring NE to be restarted. Dose as high as 38 mcg. Now back to 11 mcg. Intermittent fevers up to 102.5.   Objective:   Blood pressure (!) 149/82, pulse (!) 115, temperature 99.1 F (37.3 C), temperature source Axillary, resp. rate (!) 44, height 5' 6 (1.676 m), weight 59.9 kg, SpO2 96%.        Intake/Output Summary (Last 24 hours) at 08/05/2024 0719 Last data filed at 08/05/2024 0700 Gross per 24 hour  Intake 1971.48 ml  Output 1000 ml  Net 971.48 ml   Filed Weights   08/03/24  0500 08/04/24 0515 08/05/24 0500  Weight: 59 kg 59.3 kg 59.9 kg   Physical Examination:  General:Middle aged female in NAD Lungs: Clear bilateral breath sounds Heart: RRR, no MRG Abdomen: Soft, non distended, non-tender Neuro: Eyes open to voice. Seems to track. Attempts to protrude tongue to commands. No other command following.  Withdrawals from pain.    Na 147, K 3.6, CO2 18, Gap 14, WBC 17 (stable), Hgb 11.5  Resolved Problem List:  Lactic acidosis resolved 9/1 SIRS: off abx.   Assessment and Plan:  aSAH due to ruptured ACOM aneurysm status post coiling, associated hydrocephalus status post EVD Acute encephalopathy due to [facility] CTA 9/2: Showing left acute infarct which is new along with vasospasm in ACA, R MCA, left PCA territory.  Prior SAH, IVH and ventricular enlargement have improved. MRI done showed large left ACA acute infarct without midline shift and other multiple vascular territory infarct. Repeat head CT 9/10 stable from 9/5 with scattered bilateral infarcts and stable volume of SAH, IVH. EVD removed 9/10. 7 days Keppra  ended 9/9. - Continue nimodipine .  - SBP goal 160-235mmHg. Continue midodrine  and NE infusion. Vaso off. BP goal per neurosurgery. - TCD's per protocol - Neurosurgery following - Maintain normal sodium levels.   Fever  Leukocytosis  Suspect fever is neuro related. Procal 9/8 negative. Not on culprit meds. Unclear if her leukocytosis is related to high dose pressors or infection. CXR does not demonstrate a pneumonia. EVD was removed 9/10.  - WBC stable, UA negative, Cx pending.  - Maintain PICC for now. - continue zosyn  - DC vanc as MRSA PCR now negative.   Sinus tachycardia  Suspect this is neuro related. 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  Blood cultures have been negative.  Presented with elevated lactate but now normal. Per NSGY, aneurysm position less likely to be secondary to mycotic aneurysm.  - query multiple infarcts on CT related to possible vegetation  - TEE low yield  - cardiology deferred TEE until patient has improved  - repeat blood cultures pending   History of COPD  Per family no history of  smoking.  However reportedly has COPD. - duonebs prn   History of ETOH use Polysubstance abuse, cocaine  Chronic pain  - Continue thiamine , folate, multivitamin supplementation - Seizure precautions  History of HTN: - home regimen on hold with need for pressors for SBP goal   DMT2:  - Continue SSI, every 4 hours - CBG goal 140-180  Physical deconditioning: At risk for malnutrition: - PT/OT/SLP - Continue tube feeds via cortrak - protein supplementation, multivitamin, thiamine , folic acid    CC time: 36 min   Deward Eastern, AGACNP-BC [facility]  See Amion for personal pager PCCM on call pager [phone] until 7pm. Please call Elink 7p-7a. [phone]  08/05/2024 7:33 AM

## 2024-08-05 NOTE — Progress Notes (Signed)
 During assessment, this RN found a 2mg  vial of unopened Morphine  on this computer. This RN returned medication to [facility] under patient's name. Please see Pyxis for return dose.

## 2024-08-05 NOTE — Progress Notes (Signed)
 Orthopedic Tech Progress Note Patient Details:  Daisy [name] [birth year]  Called in order to [employer] for a (MEDIUM) RESTING HAND SPLINT    Patient ID: ALYDA MEGNA, female   DOB: 09/18/1956, 68 y.o.   MRN: [id number]  Delanna LITTIE Pac 08/05/2024, 1:33 PM

## 2024-08-06 ENCOUNTER — Inpatient Hospital Stay (HOSPITAL_COMMUNITY)

## 2024-08-06 DIAGNOSIS — I609 Nontraumatic subarachnoid hemorrhage, unspecified: Secondary | ICD-10-CM | POA: Diagnosis not present

## 2024-08-06 LAB — CBC
HCT: 33.8 % — ABNORMAL LOW (ref 36.0–46.0)
Hemoglobin: 11.5 g/dL — ABNORMAL LOW (ref 12.0–15.0)
MCH: 30 pg (ref 26.0–34.0)
MCHC: 34 g/dL (ref 30.0–36.0)
MCV: 88.3 fL (ref 80.0–100.0)
Platelets: 337 K/uL (ref 150–400)
RBC: 3.83 MIL/uL — ABNORMAL LOW (ref 3.87–5.11)
RDW: 15.6 % — ABNORMAL HIGH (ref 11.5–15.5)
WBC: 20.6 K/uL — ABNORMAL HIGH (ref 4.0–10.5)
nRBC: 0.7 % — ABNORMAL HIGH (ref 0.0–0.2)

## 2024-08-06 LAB — POCT I-STAT 7, (LYTES, BLD GAS, ICA,H+H)
Acid-base deficit: 6 mmol/L — ABNORMAL HIGH (ref 0.0–2.0)
Bicarbonate: 18.3 mmol/L — ABNORMAL LOW (ref 20.0–28.0)
Calcium, Ion: 1.27 mmol/L (ref 1.15–1.40)
HCT: 33 % — ABNORMAL LOW (ref 36.0–46.0)
Hemoglobin: 11.2 g/dL — ABNORMAL LOW (ref 12.0–15.0)
O2 Saturation: 96 %
Patient temperature: 100.8
Potassium: 4 mmol/L (ref 3.5–5.1)
Sodium: 156 mmol/L — ABNORMAL HIGH (ref 135–145)
TCO2: 19 mmol/L — ABNORMAL LOW (ref 22–32)
pCO2 arterial: 31.1 mmHg — ABNORMAL LOW (ref 32–48)
pH, Arterial: 7.382 (ref 7.35–7.45)
pO2, Arterial: 86 mmHg (ref 83–108)

## 2024-08-06 LAB — BASIC METABOLIC PANEL WITH GFR
Anion gap: 15 (ref 5–15)
BUN: 34 mg/dL — ABNORMAL HIGH (ref 8–23)
CO2: 19 mmol/L — ABNORMAL LOW (ref 22–32)
Calcium: 9.5 mg/dL (ref 8.9–10.3)
Chloride: 118 mmol/L — ABNORMAL HIGH (ref 98–111)
Creatinine, Ser: 0.83 mg/dL (ref 0.44–1.00)
GFR, Estimated: >60 mL/min (ref >60)
Glucose, Bld: 174 mg/dL — ABNORMAL HIGH (ref 70–99)
Potassium: 3.4 mmol/L — ABNORMAL LOW (ref 3.5–5.1)
Sodium: 152 mmol/L — ABNORMAL HIGH (ref 135–145)

## 2024-08-06 LAB — GLUCOSE, CAPILLARY
Glucose-Capillary: 117 mg/dL — ABNORMAL HIGH (ref 70–99)
Glucose-Capillary: 136 mg/dL — ABNORMAL HIGH (ref 70–99)
Glucose-Capillary: 140 mg/dL — ABNORMAL HIGH (ref 70–99)
Glucose-Capillary: 144 mg/dL — ABNORMAL HIGH (ref 70–99)
Glucose-Capillary: 159 mg/dL — ABNORMAL HIGH (ref 70–99)
Glucose-Capillary: 190 mg/dL — ABNORMAL HIGH (ref 70–99)

## 2024-08-06 LAB — PHOSPHORUS: Phosphorus: 4.3 mg/dL (ref 2.5–4.6)

## 2024-08-06 LAB — MAGNESIUM: Magnesium: 2.6 mg/dL — ABNORMAL HIGH (ref 1.7–2.4)

## 2024-08-06 MED ORDER — NOREPINEPHRINE 4 MG/250ML-% IV SOLN
0.0000 ug/min | INTRAVENOUS | Status: DC
  Administered 2024-08-06: 4 ug/min via INTRAVENOUS
  Administered 2024-08-07: 26 ug/min via INTRAVENOUS
  Administered 2024-08-07: 24 ug/min via INTRAVENOUS
  Administered 2024-08-07: 7 ug/min via INTRAVENOUS
  Administered 2024-08-07: 22 ug/min via INTRAVENOUS
  Filled 2024-08-06 (×2): qty 250
  Filled 2024-08-06: qty 500
  Filled 2024-08-06 (×2): qty 250

## 2024-08-06 MED ORDER — LOPERAMIDE HCL 1 MG/7.5ML PO SUSP
2.0000 mg | Freq: Two times a day (BID) | ORAL | Status: DC
  Administered 2024-08-06 – 2024-08-10 (×9): 2 mg
  Filled 2024-08-06 (×9): qty 15

## 2024-08-06 MED ORDER — OXYCODONE HCL 5 MG PO TABS
10.0000 mg | ORAL_TABLET | Freq: Four times a day (QID) | ORAL | Status: DC | PRN
Start: 2024-08-06 — End: 2024-08-14
  Administered 2024-08-06 – 2024-08-13 (×12): 10 mg
  Filled 2024-08-06 (×12): qty 2

## 2024-08-06 MED ORDER — ACETAMINOPHEN 160 MG/5ML PO SOLN
650.0000 mg | ORAL | Status: AC | PRN
  Administered 2024-08-08: 650 mg
  Filled 2024-08-06: qty 20.3

## 2024-08-06 MED ORDER — FREE WATER
200.0000 mL | Status: DC
  Administered 2024-08-06 – 2024-08-07 (×6): 200 mL

## 2024-08-06 MED ORDER — POTASSIUM CHLORIDE 20 MEQ PO PACK
40.0000 meq | PACK | Freq: Once | ORAL | Status: AC
  Administered 2024-08-06: 40 meq
  Filled 2024-08-06: qty 2

## 2024-08-06 MED ORDER — POTASSIUM CHLORIDE 20 MEQ PO PACK: 40.0000 meq | PACK | Freq: Once | ORAL | Status: DC

## 2024-08-06 NOTE — Plan of Care (Signed)
  Problem: Clinical Measurements: Goal: Ability to maintain clinical measurements within normal limits will improve Outcome: Progressing Goal: Respiratory complications will improve Outcome: Progressing   Problem: Coping: Goal: Will identify appropriate support needs Outcome: Progressing   Problem: Health Behavior/Discharge Planning: Goal: Goals will be collaboratively established with patient/family Outcome: Progressing   Problem: Education: Goal: Knowledge of General Education information will improve Description: Including pain rating scale, medication(s)/side effects and non-pharmacologic comfort measures Outcome: Not Progressing   Problem: Education: Goal: Knowledge of disease or condition will improve Outcome: Not Progressing   Problem: Coping: Goal: Will verbalize positive feelings about self Outcome: Not Progressing

## 2024-08-06 NOTE — Progress Notes (Signed)
 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 S/p diagnostic cerebral angiogram, coil embolization of ACA aneurysm 9/1. EEG 9/2 suggestive of moderate to severe diffuse encephalopathy. PMHx: HTN, polysubstance abuse, chronic back pain, T2DM, COPD.  L Resting Hand splint added 9/12.   OT comments  L resting hand pre-fab splint received and fitted to the patient.  Will discuss with RN 4 hours no/off schedule.  OT will observe for fit and skin integrity issues/swelling.  Initial fit is good, no initial concerns.        If plan is discharge home, recommend the following:  Two people to help with walking and/or transfers;Two people to help with bathing/dressing/bathroom;Assistance with cooking/housework;Assistance with feeding;Direct supervision/assist for medications management;Direct supervision/assist for financial management;Assist for transportation;Help with stairs or ramp for entrance;Supervision due to cognitive status   Equipment Recommendations  None recommended by OT    Recommendations for Other Services      Precautions / Restrictions Precautions Precautions: Fall Recall of Precautions/Restrictions: Impaired Precaution/Restrictions Comments: permissive HTN given vasospasm, no set amount but RN states <200 SBP. EVD clamped during session. Rectal pouch Restrictions Weight Bearing Restrictions Per Provider Order: No                Extremity/Trunk Assessment Upper Extremity Assessment LUE Deficits / Details: incr tone to elbow and shoulder,resting hand splint placed 9/12. LUE Sensation: decreased light touch;decreased proprioception LUE Coordination: decreased fine motor;decreased gross motor   Lower Extremity Assessment Lower Extremity Assessment: Defer to PT  evaluation   Cervical / Trunk Assessment Cervical / Trunk Assessment: Other exceptions Cervical / Trunk Exceptions: head turn to L    Vision Ability to See in Adequate Light: 1 Impaired     Perception Perception Perception: Not tested   Praxis Praxis Praxis: Not tested   Communication Communication Communication: Impaired Factors Affecting Communication: Difficulty expressing self   Cognition Arousal: Alert Behavior During Therapy: Flat affect Cognition: Difficult to assess Difficult to assess due to: Impaired communication           OT - Cognition Comments: No command following noted                 Following commands: Intact        Cueing   Cueing Techniques: Gestural cues, Verbal cues, Tactile cues, Visual cues  Exercises      Shoulder Instructions       General Comments      Pertinent Vitals/ Pain       Pain Assessment Pain Assessment: No/denies pain Pain Intervention(s): Monitored during session                                                          Frequency  Min 1X/week        Progress Toward Goals  OT Goals(current goals can now be found in the care plan section)     Acute Rehab OT Goals OT Goal Formulation: Patient unable to participate in goal setting Time For Goal Achievement: 08/10/24 Potential to Achieve Goals: Fair  Plan      Co-evaluation  AM-PAC OT 6 Clicks Daily Activity     Outcome Measure   Help from another person eating meals?: Total Help from another person taking care of personal grooming?: Total Help from another person toileting, which includes using toliet, bedpan, or urinal?: Total Help from another person bathing (including washing, rinsing, drying)?: Total Help from another person to put on and taking off regular upper body clothing?: Total Help from another person to put on and taking off regular lower body clothing?: Total 6 Click Score: 6     End of Session    OT Visit Diagnosis: Unsteadiness on feet (R26.81);Other abnormalities of gait and mobility (R26.89);Muscle weakness (generalized) (M62.81);History of falling (Z91.81)   Activity Tolerance Patient tolerated treatment well   Patient Left in bed;with call bell/phone within reach;with bed alarm set   Nurse Communication Other (comment) (Splint placed)        Time: 8849-8794 OT Time Calculation (min): 15 min  Charges: OT General Charges $OT Visit: 1 Visit OT Treatments $Orthotics Fit/Training: 8-22 mins  08/06/2024  RP, OTR/L  Acute Rehabilitation Services  Office:  [phone]   [name] 08/06/2024, 12:16 PM

## 2024-08-06 NOTE — Progress Notes (Signed)
 Transcranial Doppler   Date POD PCO2 HCT BP   MCA ACA PCA OPHT SIPH VERT Basilar  9/2 JH 1   36.7 152/94 Right  Left   *  *   *  *   *  *   16  10   *  22   -23  -23   -31        9/3 JH 2    36.3  158/107  Right  Left    *   *    *   *    *   *    10   13    25   19     -19   -16    *   *     9/5 RS         Right  Left    *   *    *   *    *   *    20   19    *   42    -22   -28    -29        9/8 CK          Right  Left    *   *    *   *    *   *    20   19    *   56    *   *    *   *    9/10 JH    9    35.3  138/87  Right  Left    *   *    *   *    *   *    12   13    19    36    -15   -23    -24        9/12 11    33.0  177/107  Right  Left    *   *    *   *    *   *    13   13    33   29    -46   -25    -14                 Right  Left                                                       MCA = Middle Cerebral Artery      OPHT = Opthalmic Artery     BASILAR = Basilar Artery   ACA = Anterior Cerebral Artery     SIPH = Carotid Siphon PCA = Posterior Cerebral Artery   VERT = Verterbral Artery                    Normal MCA = 62+\-12 ACA = 50+\-12 PCA = 42+\-23         (*) Not insonated -Poor transtemporal windows.     Results can be found under chart review under CV PROC. 08/06/2024 2:49 PM Hibo Blasdell RVT, RDMS

## 2024-08-06 NOTE — Progress Notes (Signed)
 Patient with pupil change around 0900.  L pupil is 5 and reactive and R pupil is 2 and reactive.  See flowsheet for previous pupil sizes and full neuro assessment.  Neurosx aware and STAT head ordered and obtained.

## 2024-08-06 NOTE — Progress Notes (Addendum)
 NAME:  Daisy [name], MRN:  [id number], DOB:  1956-08-15, LOS: 12 ADMISSION DATE:  07/25/2024, CONSULTATION DATE:  07/25/2024 REFERRING MD: Darnella - NSGY, CHIEF COMPLAINT:  Sepsis/SAH   History of Present Illness:  68 year old woman who presented to [facility] 8/31 for AMS and not feel well x 1 week. PMHx significant for HTN, COPD (not on home O2), DMT2, vitamin D deficiency, GERD, chronic back pain, EtOH abuse, polysubstance abuse.  Per report, patient was confused and feeling unwell x 1 week and was found down at home. Patient's son called 911. EMS found patient on the ground and drenched in urine with tachycardia and temp 99.45F. Patient was reportedly more confused on day of admission and had been urinating frequently and on herself and having headaches.  She drinks 2-6 beers daily and uses cocaine (last 1 week PTA).  On ED arrival, labs were notable for K 2.6, AG 17, LA 3--->5.9, WBC 17.8. Working diagnosis was sepsis and patient was started on antibiotics.  Due to confusion, CT Head performed demonstrating SAH.  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  CT chest/abd/pelvis did not reveal any acute issues. Transferred to [facility] for further care.  PCCM consulted  Pertinent Medical History:  HTN, COPD (not on home O2), DMT2, Vitamin D deficiency, GERD, chronic back pain, EtOH abuse, polysubstance abuse   Significant Hospital Events: Including procedures, antibiotic start and stop dates in addition to other pertinent events   8/31 - Transferred to [facility] ICU from [facility] acute SAH d/t ruptured Acomm and associated Hydrocephalus 9/1 - Underwent Acomm aneurysm coiling (NSGY) 9/2 Per RN, decreased responsiveness since coiling; prior to intervention was alert and oriented to self, talking/following commands. Spot EEG negative for seizures. Permissive HTN, off of Cleviprex  per NSGY.  Echocardiogram showed  hyperechoic mobile density in the anterior left hip of the mitral valve.  LVEF 60 to 65% normal RV function. Mental status declined on 9/1 with worsening lethargy no longer following commands.  CT was initially negative for change, EEG has been negative for seizure.  BP parameters changed to make goal 180-200 for permissive hypertension on 9/2.  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  Mental status continues to be poor.  Cultures still negative. 9/6: Tracking today.  Saying ouch to painful stimuli. 9/9: stable. Still on levo/vaso for SBP >160 9/10 EVD removed. Wide BP swings. Intermittent fevers. Max 102.5.  Interim History / Subjective:  Off pressors this morning Brief periods to tachypnea Intermittent fevers  Objective:   Blood pressure (!) 158/110, pulse (!) 121, temperature (!) 100.8 F (38.2 C), temperature source Axillary, resp. rate (!) 22, height 5' 6 (1.676 m), weight 59.7 kg, SpO2 99%.        Intake/Output Summary (Last 24 hours) at 08/06/2024 0803 Last data filed at 08/06/2024 0700 Gross per 24 hour  Intake 2432.53 ml  Output 2450 ml  Net -17.47 ml   Filed Weights   08/04/24 0515 08/05/24 0500 08/06/24 0545  Weight: 59.3 kg 59.9 kg 59.7 kg   Physical Examination:  General:Adult female in NAD Lungs: Clear bilateral breath sounds Heart:  Tachy, regular, no MRG Abdomen: Soft, NT. ND Neuro: Eyes open, tracks. Does not follow commands. Flicker to pain.   Na 152, K 3.4, CO2 19, Gap 15, WBC 20 (stable), Hgb 11.5 Tmax 101.9  Resolved Problem List:  Lactic acidosis resolved 9/1 SIRS: off abx.   Assessment and Plan:  aSAH due to ruptured ACOM aneurysm  status post coiling, associated hydrocephalus status post EVD Acute encephalopathy due to [facility] CTA 9/2: Showing left acute infarct which is new along with vasospasm in ACA, R MCA, left PCA territory.  Prior SAH, IVH and ventricular enlargement have improved. MRI done showed large left ACA acute infarct without midline shift and other multiple vascular territory infarct. Repeat head CT 9/10 stable from 9/5 with scattered bilateral infarcts and stable volume of SAH, IVH. EVD removed 9/10. 7 days Keppra  ended 9/9. - Continue nimodipine  half dose - SBP goal > 160 mmHg.  Can start to normalize goal today, while watching her neuro exam. Will keep midodrine  for now as her BP has been labile. Will plan to wean based on course.  - TCD's with poor windows - Neurosurgery following - Maintain normal sodium levels - increase free water  frequency.   Fever  Leukocytosis  Suspect fever is neuro related. Procal 9/8 negative. CXR does not demonstrate a pneumonia. EVD was removed 9/10.  - UA negative, Cx pending.  - Maintain PICC for now. - Repeat CXR - continue zosyn , vanco. Low threshold to DC CXR remains negative.  Sinus tachycardia  Suspect this is neuro related. 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  Blood cultures have been negative.  Presented with elevated lactate but now normal. Per NSGY, aneurysm position less likely to be secondary to mycotic aneurysm.  - query multiple infarcts on CT related to possible vegetation  - cardiology deferred TEE until patient has improved  - repeat blood cultures pending   History of COPD  Per family no history of smoking.  However reportedly has COPD. - duonebs prn   History of ETOH use Polysubstance abuse, cocaine  Chronic pain  - Continue thiamine , folate, multivitamin supplementation - Seizure  precautions  History of HTN: - home regimen on hold with need for pressors for SBP goal   DMT2:  - Continue SSI, every 4 hours - CBG goal 140-180  Physical deconditioning: At risk for malnutrition: - PT/OT/SLP - Continue tube feeds via cortrak - protein supplementation, multivitamin, thiamine , folic acid    CC time: 35 min   Deward Eastern, AGACNP-BC [facility]  See Amion for personal pager PCCM on call pager [phone] until 7pm. Please call Elink 7p-7a. [phone]  08/06/2024 8:03 AM

## 2024-08-06 NOTE — Progress Notes (Signed)
 SLP Cancellation Note  Patient Details Name: Daisy [name] MRN: [id number] DOB: 1956/11/03   Cancelled treatment:       Reason Eval/Treat Not Completed: Patient not medically ready. Lethargic and change in status. Will f/u next week.    Hagan Maltz, Consuelo Fitch 08/06/2024, 10:41 AM

## 2024-08-06 NOTE — Plan of Care (Signed)
  Problem: Fluid Volume: Goal: Hemodynamic stability will improve Outcome: Progressing   Problem: Respiratory: Goal: Ability to maintain adequate ventilation will improve Outcome: Progressing   Problem: Health Behavior/Discharge Planning: Goal: Goals will be collaboratively established with patient/family Outcome: Progressing   Problem: Coping: Goal: Will verbalize positive feelings about self Outcome: Not Progressing Goal: Will identify appropriate support needs Outcome: Not Progressing

## 2024-08-06 NOTE — Progress Notes (Signed)
 [facility] ADULT ICU REPLACEMENT PROTOCOL   The patient does apply for the [facility] Adult ICU Electrolyte Replacment Protocol based on the criteria listed below:   1.Exclusion criteria: TCTS, ECMO, Dialysis, and Myasthenia Gravis patients 2. Is GFR >/= 30 ml/min? Yes.    Patient's GFR today is >60 3. Is SCr </= 2? Yes.   Patient's SCr is 0.83 mg/dL 4. Did SCr increase >/= 0.5 in 24 hours? No. 5.Pt's weight >40kg  Yes.   6. Abnormal electrolyte(s): Potassium  7. Electrolytes replaced per protocol 8.  Call MD STAT for K+ </= 2.5, Phos </= 1, or Mag </= 1 Physician:  Dr. epimenio Medico A Brissa Asante 08/06/2024 5:34 AM

## 2024-08-06 NOTE — Progress Notes (Signed)
    Providing Compassionate, Quality Care - Together   NEUROSURGERY PROGRESS NOTE     S: NAEs o/n.   O: EXAM:  BP (!) 162/107 (BP Location: Left Leg)   Pulse (!) 123   Temp (!) 100.8 F (38.2 C) (Axillary)   Resp (!) 34   Ht 5' 6 (1.676 m)   Wt 59.7 kg   SpO2 99%   BMI 21.24 kg/m     Eyes open spontaneously Not following commands Flicker response to pain LUE, minimal w/d RUE/BLE L pupil 7mm, R pupil 2mm. Sluggishly reactive   ASSESSMENT:  68 y.o.  SAH d# 12 s/p coil embolization of Acom aneurysm. Remains obtunded after coiling which did reveal significant ACA spasm. Spot EEG negative. Left ACA territory stroke and multiple smaller embolic strokes on recent MRI - Vasospasm seen on initial angiogram, TCD with poor sonographic windows, largely unhelpful. - TTE reveals possible mitral vegetation, no plan for further eval currently. - HCP appears to be resolved. - Fevers of unclear origin, likely central.  -Repeat CTH this AM 9/12 without acute processes.     PLAN: -Continue goal SBP>160 -Continue Nimotop  Q2H -Continue supportive care per CCM -Call w/ questions/concerns.   Camie Pickle, [facility]

## 2024-08-06 NOTE — Progress Notes (Signed)
 PT Cancellation Note  Patient Details Name: KAREEM AUL MRN: [id number] DOB: [birth year]   Cancelled Treatment:    Reason Eval/Treat Not Completed: Medical issues which prohibited therapy - pt with tachypnea in 30s upon PT arrival, RN states has been up to 60 breaths/min. PT to hold, will check back as medically appropriate.  Kaylah Chiasson S, PT DPT Acute Rehabilitation Services Secure Chat Preferred  Office [phone]    Johana FORBES [place] 08/06/2024, 2:30 PM

## 2024-08-07 DIAGNOSIS — I609 Nontraumatic subarachnoid hemorrhage, unspecified: Secondary | ICD-10-CM | POA: Diagnosis not present

## 2024-08-07 DIAGNOSIS — Z515 Encounter for palliative care: Secondary | ICD-10-CM | POA: Diagnosis not present

## 2024-08-07 DIAGNOSIS — Z7189 Other specified counseling: Secondary | ICD-10-CM | POA: Diagnosis not present

## 2024-08-07 DIAGNOSIS — D72829 Elevated white blood cell count, unspecified: Secondary | ICD-10-CM

## 2024-08-07 DIAGNOSIS — E440 Moderate protein-calorie malnutrition: Secondary | ICD-10-CM

## 2024-08-07 DIAGNOSIS — R6510 Systemic inflammatory response syndrome (SIRS) of non-infectious origin without acute organ dysfunction: Secondary | ICD-10-CM | POA: Diagnosis not present

## 2024-08-07 LAB — PHOSPHORUS: Phosphorus: 3.8 mg/dL (ref 2.5–4.6)

## 2024-08-07 LAB — MAGNESIUM: Magnesium: 2.5 mg/dL — ABNORMAL HIGH (ref 1.7–2.4)

## 2024-08-07 LAB — COMPREHENSIVE METABOLIC PANEL WITH GFR
ALT: 46 U/L — ABNORMAL HIGH (ref 0–44)
AST: 55 U/L — ABNORMAL HIGH (ref 15–41)
Albumin: 2.9 g/dL — ABNORMAL LOW (ref 3.5–5.0)
Alkaline Phosphatase: 75 U/L (ref 38–126)
Anion gap: 13 (ref 5–15)
BUN: 31 mg/dL — ABNORMAL HIGH (ref 8–23)
CO2: 18 mmol/L — ABNORMAL LOW (ref 22–32)
Calcium: 9.4 mg/dL (ref 8.9–10.3)
Chloride: 121 mmol/L — ABNORMAL HIGH (ref 98–111)
Creatinine, Ser: 0.75 mg/dL (ref 0.44–1.00)
GFR, Estimated: >60 mL/min (ref >60)
Glucose, Bld: 256 mg/dL — ABNORMAL HIGH (ref 70–99)
Potassium: 3.8 mmol/L (ref 3.5–5.1)
Sodium: 152 mmol/L — ABNORMAL HIGH (ref 135–145)
Total Bilirubin: 0.2 mg/dL (ref 0.0–1.2)
Total Protein: 7.3 g/dL (ref 6.5–8.1)

## 2024-08-07 LAB — GLUCOSE, CAPILLARY
Glucose-Capillary: 120 mg/dL — ABNORMAL HIGH (ref 70–99)
Glucose-Capillary: 140 mg/dL — ABNORMAL HIGH (ref 70–99)
Glucose-Capillary: 154 mg/dL — ABNORMAL HIGH (ref 70–99)
Glucose-Capillary: 155 mg/dL — ABNORMAL HIGH (ref 70–99)
Glucose-Capillary: 165 mg/dL — ABNORMAL HIGH (ref 70–99)
Glucose-Capillary: 168 mg/dL — ABNORMAL HIGH (ref 70–99)

## 2024-08-07 LAB — CBC
HCT: 32.7 % — ABNORMAL LOW (ref 36.0–46.0)
Hemoglobin: 10.5 g/dL — ABNORMAL LOW (ref 12.0–15.0)
MCH: 29.3 pg (ref 26.0–34.0)
MCHC: 32.1 g/dL (ref 30.0–36.0)
MCV: 91.3 fL (ref 80.0–100.0)
Platelets: 301 K/uL (ref 150–400)
RBC: 3.58 MIL/uL — ABNORMAL LOW (ref 3.87–5.11)
RDW: 16.1 % — ABNORMAL HIGH (ref 11.5–15.5)
WBC: 16.6 K/uL — ABNORMAL HIGH (ref 4.0–10.5)
nRBC: 1.9 % — ABNORMAL HIGH (ref 0.0–0.2)

## 2024-08-07 MED ORDER — NOREPINEPHRINE 16 MG/250ML-% IV SOLN
0.0000 ug/min | INTRAVENOUS | Status: DC
  Administered 2024-08-08 (×3): 30 ug/min via INTRAVENOUS
  Administered 2024-08-09: 40 ug/min via INTRAVENOUS
  Administered 2024-08-09: 38 ug/min via INTRAVENOUS
  Administered 2024-08-10: 13 ug/min via INTRAVENOUS
  Filled 2024-08-07 (×6): qty 250

## 2024-08-07 MED ORDER — FREE WATER
300.0000 mL | Status: DC
  Administered 2024-08-07 – 2024-08-10 (×18): 300 mL

## 2024-08-07 NOTE — Consult Note (Signed)
 Consultation Note Date: 08/07/2024   Patient Name: Daisy [name]  DOB: 1956/08/11  MRN: [id number]  Age / Sex: 68 y.o., female  PCP: Darra Hamilton, PA-C Referring Physician: Kara Dorn NOVAK, MD  Reason for Consultation: Establishing goals of care  HPI/Patient Profile: 68 y.o. female  with past medical history of HTN, COPD (not on home O2), DMT2, vitamin D deficiency, GERD, chronic back pain, EtOH abuse, polysubstance abuse admitted on 07/25/2024 with altered mental status after being found down by son.   Patient was initially admitted with a working diagnosis of sepsis and found to have SAH on CT head, aneurysm on CTA as likely source of SAH.  She had coiling on 9/1 with subsequent decline in mental status.  Repeat CTA then showed vasospasm, new large acute left anterior cerebral territory infarct, as well as small acute infarct in the PCA territory on the left on 9/2.  Echo on 9/3 showed concerns for vegetation, though unable to get TEE until patient improves further.  She is on and off pressors as needed with intermittent fevers.  PMT has been consulted to assist with goals of care conversation.  Clinical Assessment and Goals of Care:  I have reviewed medical records including EPIC notes, labs and imaging, discussed with RN, assessed the patient and then met at the bedside with patient's son to discuss diagnosis prognosis, GOC, EOL wishes, disposition and options.  I introduced Palliative Medicine as specialized medical care for people living with serious illness. It focuses on providing relief from the symptoms and stress of a serious illness. The goal is to improve quality of life for both the patient and the family.  We discussed a brief life review of the patient and then focused on their current illness.   I attempted to elicit values and goals of care important to the patient.    Medical History Review and Understanding:  We discussed patient's acute  illness in the context of their chronic comorbidities. Patient's son seems to understand the severity of patient's illness.  Social History: Patient has never been married. She lives at home with her boyfriend. She has 2 sons, the younger of which is under DSS custody in [place]. She does not have a lot of support from friends per her eldest son. She previously enjoyed shopping and dressing up. She is described as a Geophysical data processor.  Functional and Nutritional State: Patient  Palliative Symptoms: Dyspnea, pain  Advance Directives: No documentation current on file. Patient's next of kin is her son.  Code Status: Concepts specific to code status, artifical feeding and hydration, and rehospitalization were considered and discussed. Recommended consideration of DNR status, understanding evidenced-based poor outcomes in similar hospitalized patients, as the cause of the arrest is likely associated with chronic/terminal disease rather than a reversible acute cardio-pulmonary event.  Discussion: Patient's son has never discussed patient's care preferences with her, as she did not share the details of her medical conditions openly.  Emotional support and therapeutic listening was provided as he shared the difficulty he has trying to be here for without much support for himself.  She does not have many friends or other people in her life either.  He has not been to work since her admission and will be returning for the first time tonight, though still grappling with whether he wants to go or not.  He has been driving door Dash in the nearby vicinity in order to be close by if anything were to happen acutely.  We discussed the  importance of planning for the worst while still hoping for the best. Discussed high likelihood that patient would require 24/7 care even the best case scenario of survival.  Patient's son does not feel like she would ever want to live in a facility, but he does not see any other  options.  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  He is still grappling with the fact that patient's boyfriend waited so long to inform him of her declining mental status.  He has also tried to get visitation secured from patient's younger son, who is in DSS custody.  Offered to assist with any documentation that they would require.   I shared my concern for her suffering without improvement as expected.  He would like to stay positive.  We discussed his concern for possibility of sedating medications, clarifying that she is receiving minimal pain/sedating medications.  Discussed that she would benefit from more liberal comfort medications, though we are not currently able to provide as effectively given focus on prolonging her life with aggressive care.    Discussed the importance of continued conversation with family and the medical providers regarding overall plan of care and treatment options, ensuring decisions are within the context of the patient's values and GOCs.   Questions and concerns were addressed.  Hard Choices booklet left for review. The family was encouraged to call with questions or concerns.  PMT will continue to support holistically.   SUMMARY OF RECOMMENDATIONS   - Continue full code/full scope treatment - Recommended DNR/DNI with consideration of additional limitations/boundaries given patient's suffering - Ongoing GOC discussions. Patient's son is overwhelmed and hopeful for some recovery - Psychosocial and emotional support provided -PMT will continue to follow and support  Prognosis:  Poor  Discharge Planning: To Be Determined      Primary Diagnoses: Present on Admission:  SIRS (systemic inflammatory response syndrome) (HCC)  HYPOKALEMIA, MILD    Physical Exam Vitals and nursing note reviewed.  Constitutional:      General: She is in acute distress.     Appearance: She is ill-appearing.  HENT:     Head: Normocephalic and atraumatic.  Cardiovascular:     Rate  and Rhythm: Tachycardia present.  Pulmonary:     Effort: Tachypnea and respiratory distress present.  Skin:    General: Skin is warm and dry.  Neurological:     Mental Status: She is lethargic.  Psychiatric:        Speech: She is noncommunicative.        Cognition and Memory: Cognition is impaired.     Vital Signs: BP (!) 144/94   Pulse (!) 113   Temp 98.3 F (36.8 C) (Axillary)   Resp (!) 32   Ht 5' 6 (1.676 m)   Wt 57.8 kg   SpO2 98%   BMI 20.57 kg/m  Pain Scale: CPOT   Pain Score: Asleep   SpO2: SpO2: 98 % O2 Device:SpO2: 98 % O2 Flow Rate: .O2 Flow Rate (L/min): 2 L/min   Palliative Assessment/Data: 30% (on tube feeds)   Billing based on MDM: High  Problems Addressed: One acute or chronic illness or injury that poses a threat to life or bodily function  Amount and/or Complexity of Data: Category 1:Review of prior external note(s) from each unique source, Review of the result(s) of each unique test, and Assessment requiring an independent historian(s), Category 2:Independent interpretation of a test performed by another physician/other qualified health care professional (not separately reported), and Category 3:Discussion of management  or test interpretation with external physician/other qualified health care professional/appropriate source (not separately reported)  Risks: Decision regarding hospitalization or escalation of hospital care   Maxie Slovacek SHAUNNA Fell, PA-C  Palliative Medicine Team Team phone # [phone]  Thank you for allowing the Palliative Medicine Team to assist in the care of this patient. Please utilize secure chat with additional questions, if there is no response within 30 minutes please call the above phone number.  Palliative Medicine Team providers are available by phone from 7am to 7pm daily and can be reached through the team cell phone.  Should this patient require assistance outside of these hours, please call the patient's attending  physician.

## 2024-08-07 NOTE — Progress Notes (Signed)
 Patient is awake this morning.  She appears somewhat aware.  She is nonverbal.  She has some weak purposeful movements in her upper extremities without significant laterality.  Follow-up head CT scan yesterday with stable appearance of her distal ACA infarct.  No evidence of progressive hydrocephalus.  Patient status post ACOM aneurysm rupture and subsequent coiling.  Continue supportive efforts.  No new recommendations from our standpoint.

## 2024-08-07 NOTE — Progress Notes (Signed)
 eLink Physician-Brief Progress Note Patient Name: Daisy [name] DOB: 13-Dec-1955 MRN: [id number]   Date of Service  08/07/2024  HPI/Events of Note  Arterial line is non-functional.  eICU Interventions  Order to discontinue arterial line entered.        Kinga Cassar U Cleophus Mendonsa 08/07/2024, 1:44 AM

## 2024-08-07 NOTE — Plan of Care (Signed)
 Problem: Fluid Volume: Goal: Hemodynamic stability will improve Outcome: Not Progressing   Problem: Clinical Measurements: Goal: Diagnostic test results will improve Outcome: Not Progressing Goal: Signs and symptoms of infection will decrease Outcome: Not Progressing   Problem: Respiratory: Goal: Ability to maintain adequate ventilation will improve Outcome: Not Progressing   Problem: Education: Goal: Knowledge of General Education information will improve Description: Including pain rating scale, medication(s)/side effects and non-pharmacologic comfort measures Outcome: Not Progressing   Problem: Health Behavior/Discharge Planning: Goal: Ability to manage health-related needs will improve Outcome: Not Progressing   Problem: Clinical Measurements: Goal: Ability to maintain clinical measurements within normal limits will improve Outcome: Not Progressing Goal: Will remain free from infection Outcome: Not Progressing Goal: Diagnostic test results will improve Outcome: Not Progressing Goal: Respiratory complications will improve Outcome: Not Progressing Goal: Cardiovascular complication will be avoided Outcome: Not Progressing   Problem: Activity: Goal: Risk for activity intolerance will decrease Outcome: Not Progressing   Problem: Nutrition: Goal: Adequate nutrition will be maintained Outcome: Not Progressing   Problem: Coping: Goal: Level of anxiety will decrease Outcome: Not Progressing   Problem: Elimination: Goal: Will not experience complications related to bowel motility Outcome: Not Progressing Goal: Will not experience complications related to urinary retention Outcome: Not Progressing   Problem: Pain Managment: Goal: General experience of comfort will improve and/or be controlled Outcome: Not Progressing   Problem: Safety: Goal: Ability to remain free from injury will improve Outcome: Not Progressing   Problem: Skin Integrity: Goal: Risk for  impaired skin integrity will decrease Outcome: Not Progressing   Problem: Education: Goal: Ability to describe self-care measures that may prevent or decrease complications (Diabetes Survival Skills Education) will improve Outcome: Not Progressing Goal: Individualized Educational Video(s) Outcome: Not Progressing   Problem: Coping: Goal: Ability to adjust to condition or change in health will improve Outcome: Not Progressing   Problem: Fluid Volume: Goal: Ability to maintain a balanced intake and output will improve Outcome: Not Progressing   Problem: Health Behavior/Discharge Planning: Goal: Ability to identify and utilize available resources and services will improve Outcome: Not Progressing Goal: Ability to manage health-related needs will improve Outcome: Not Progressing   Problem: Metabolic: Goal: Ability to maintain appropriate glucose levels will improve Outcome: Not Progressing   Problem: Nutritional: Goal: Maintenance of adequate nutrition will improve Outcome: Not Progressing Goal: Progress toward achieving an optimal weight will improve Outcome: Not Progressing   Problem: Skin Integrity: Goal: Risk for impaired skin integrity will decrease Outcome: Not Progressing   Problem: Tissue Perfusion: Goal: Adequacy of tissue perfusion will improve Outcome: Not Progressing Problem: Spontaneous Subarachnoid Hemorrhage Tissue Perfusion: Goal: Complications of Spontaneous Subarachnoid Hemorrhage will be minimized Outcome: Not Progressing   Problem: Coping: Goal: Will verbalize positive feelings about self Outcome: Not Progressing Goal: Will identify appropriate support needs Outcome: Not Progressing   Problem: Health Behavior/Discharge Planning: Goal: Ability to manage health-related needs will improve Outcome: Not Progressing Goal: Goals will be collaboratively established with patient/family Outcome: Not Progressing   Problem: Self-Care: Goal: Ability to  participate in self-care as condition permits will improve Outcome: Not Progressing Goal: Verbalization of feelings and concerns over difficulty with self-care will improve Outcome: Not Progressing Goal: Ability to communicate needs accurately will improve Outcome: Not Progressing   Problem: Nutrition: Goal: Risk of aspiration will decrease Outcome: Not Progressing Goal: Dietary intake will improve Outcome: Not Progressing    Problem: Education: Goal: Knowledge of disease or condition will improve Outcome: Not Progressing Goal: Knowledge of secondary prevention will improve (  MUST DOCUMENT ALL) Outcome: Not Progressing Goal: Knowledge of patient specific risk factors will improve (DELETE if not current risk factor) Outcome: Not Progressing   Problem: Spontaneous Subarachnoid Hemorrhage Tissue Perfusion: Goal: Complications of Spontaneous Subarachnoid Hemorrhage will be minimized Outcome: Not Progressing

## 2024-08-07 NOTE — Progress Notes (Addendum)
 0800 patient alert tracks but doesn't follow any commands. 1100 Patient has respiratory rate 20-60s PRN pain meds given that helped but only because patient fell asleep when she woke up respiratory rate continued to fluctuate. Bath and CHG completed LEVO titrated based on BP goals.

## 2024-08-07 NOTE — Progress Notes (Signed)
 NAME:  Daisy [name], MRN:  [id number], DOB:  [birth year], LOS: 13 ADMISSION DATE:  07/25/2024, CONSULTATION DATE:  07/25/2024 REFERRING MD: Darnella - NSGY, CHIEF COMPLAINT:  Sepsis/SAH   History of Present Illness:  68 year old woman who presented to [facility] 8/31 for AMS and not feel well x 1 week. PMHx significant for HTN, COPD (not on home O2), DMT2, vitamin D deficiency, GERD, chronic back pain, EtOH abuse, polysubstance abuse.  Per report, patient was confused and feeling unwell x 1 week and was found down at home. Patient's son called 911. EMS found patient on the ground and drenched in urine with tachycardia and temp 99.51F. Patient was reportedly more confused on day of admission and had been urinating frequently and on herself and having headaches.  She drinks 2-6 beers daily and uses cocaine (last 1 week PTA).  On ED arrival, labs were notable for K 2.6, AG 17, LA 3--->5.9, WBC 17.8. Working diagnosis was sepsis and patient was started on antibiotics.  Due to confusion, CT Head performed demonstrating SAH.  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  CT chest/abd/pelvis did not reveal any acute issues. Transferred to [facility] for further care.  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 Spot EEG negative for seizures. Permissive HTN, off of Cleviprex  per NSGY.  Echocardiogram showed  hyperechoic mobile density in the anterior left hip of the mitral valve.  LVEF 60 to 65% normal RV function. Mental status declined on 9/1 with worsening lethargy no longer following commands.  CT was initially negative for change, EEG has been negative for seizure.  BP parameters changed to make goal 180-200 for permissive hypertension on 9/2.  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  Mental status continues to be poor.  Cultures still negative. 9/6: Tracking today.  Saying ouch to painful stimuli. 9/9: stable. Still on levo/vaso for SBP >160 9/10 EVD removed. Wide BP swings. Intermittent fevers. Max 102.5. 9/12 arterial line removed, on intermittent levophed  for nimotop   Interim History / Subjective:  Intermittently on levophed  after nimotop  administration Arterial line removed overnight. No fevers overnight  Objective:   Blood pressure (!) 146/101, pulse (!) 118, temperature 98.8 F (37.1 C), temperature source Axillary, resp. rate (!) 42, height 5' 6 (1.676 m), weight 57.8 kg, SpO2 98%.        Intake/Output Summary (Last 24 hours) at 08/07/2024 0731 Last data filed at 08/07/2024 0700 Gross per 24 hour  Intake 2555.83 ml  Output 2850 ml  Net -294.17 ml   Filed Weights   08/05/24 0500 08/06/24 0545 08/07/24 0600  Weight: 59.9 kg 59.7 kg 57.8 kg   Physical Examination:  General:Adult female in NAD Lungs: Clear bilateral breath sounds Heart: Tachy, regular, no MRG Abdomen: Soft, NT. ND Neuro: Eyes open, tracks. Does not follow commands.   Resolved Problem List:  Lactic acidosis resolved 9/1 SIRS: off abx.   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  Prior SAH, IVH and ventricular enlargement have improved. MRI done showed large left ACA acute infarct without midline shift and other multiple vascular territory infarct. Repeat head CT 9/10 stable from 9/5 with scattered bilateral infarcts and stable volume of SAH, IVH. EVD removed 9/10. 7 days Keppra  ended 9/9. - Continue nimodipine  half dose - SBP goal > 160 mmHg.  Levophed  as needed Will keep midodrine  for now as her BP has been labile.  - TCD's with poor windows - Neurosurgery following - Maintain normal sodium levels - increase free water  to 300mL q4hrs. FW Deficit is 2.24L   Fever  Leukocytosis  Suspect fever is neuro related. Procal 9/8 negative. CXR does not demonstrate a pneumonia. EVD was removed 9/10.  - UA negative, Cx pending.  - Maintain PICC for now. - antibiotics stopped yesterday  Sinus tachycardia  Suspect this is neuro related. 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  Blood cultures have been negative.  Presented with elevated lactate but now normal. Per NSGY, aneurysm position less likely to be secondary to mycotic aneurysm.  - query multiple infarcts on CT related to possible vegetation  - cardiology deferred TEE until patient has improved  - repeat blood cultures pending   History of COPD  Per family no history of smoking.  However reportedly has COPD. - duonebs prn   History of ETOH use Polysubstance abuse, cocaine  Chronic pain  - Continue thiamine , folate, multivitamin supplementation - Seizure precautions  History of HTN: - home regimen on hold with need for pressors for SBP goal   DMT2:   - Continue SSI, every 4 hours - CBG goal 140-180  Physical deconditioning: At risk for malnutrition: - PT/OT/SLP - Continue tube feeds via cortrak - protein supplementation, multivitamin, thiamine , folic acid    CC time: 32 min   Dorn Chill, MD [facility] Office: [phone]   See Amion for personal pager PCCM on call pager [phone] until 7pm. Please call Elink 7p-7a. [phone]

## 2024-08-08 DIAGNOSIS — I609 Nontraumatic subarachnoid hemorrhage, unspecified: Secondary | ICD-10-CM | POA: Diagnosis not present

## 2024-08-08 DIAGNOSIS — D72829 Elevated white blood cell count, unspecified: Secondary | ICD-10-CM | POA: Diagnosis not present

## 2024-08-08 DIAGNOSIS — R6510 Systemic inflammatory response syndrome (SIRS) of non-infectious origin without acute organ dysfunction: Secondary | ICD-10-CM | POA: Diagnosis not present

## 2024-08-08 DIAGNOSIS — Z515 Encounter for palliative care: Secondary | ICD-10-CM | POA: Diagnosis not present

## 2024-08-08 DIAGNOSIS — Z7189 Other specified counseling: Secondary | ICD-10-CM | POA: Diagnosis not present

## 2024-08-08 LAB — COMPREHENSIVE METABOLIC PANEL WITH GFR
ALT: 49 U/L — ABNORMAL HIGH (ref 0–44)
AST: 49 U/L — ABNORMAL HIGH (ref 15–41)
Albumin: 2.8 g/dL — ABNORMAL LOW (ref 3.5–5.0)
Alkaline Phosphatase: 81 U/L (ref 38–126)
Anion gap: 9 (ref 5–15)
BUN: 23 mg/dL (ref 8–23)
CO2: 19 mmol/L — ABNORMAL LOW (ref 22–32)
Calcium: 8.6 mg/dL — ABNORMAL LOW (ref 8.9–10.3)
Chloride: 116 mmol/L — ABNORMAL HIGH (ref 98–111)
Creatinine, Ser: 0.59 mg/dL (ref 0.44–1.00)
GFR, Estimated: >60 mL/min (ref >60)
Glucose, Bld: 348 mg/dL — ABNORMAL HIGH (ref 70–99)
Potassium: 3.3 mmol/L — ABNORMAL LOW (ref 3.5–5.1)
Sodium: 144 mmol/L (ref 135–145)
Total Bilirubin: 0.3 mg/dL (ref 0.0–1.2)
Total Protein: 7.1 g/dL (ref 6.5–8.1)

## 2024-08-08 LAB — GLUCOSE, CAPILLARY
Glucose-Capillary: 152 mg/dL — ABNORMAL HIGH (ref 70–99)
Glucose-Capillary: 155 mg/dL — ABNORMAL HIGH (ref 70–99)
Glucose-Capillary: 157 mg/dL — ABNORMAL HIGH (ref 70–99)
Glucose-Capillary: 163 mg/dL — ABNORMAL HIGH (ref 70–99)
Glucose-Capillary: 174 mg/dL — ABNORMAL HIGH (ref 70–99)
Glucose-Capillary: 182 mg/dL — ABNORMAL HIGH (ref 70–99)

## 2024-08-08 LAB — CBC
HCT: 32 % — ABNORMAL LOW (ref 36.0–46.0)
Hemoglobin: 10.5 g/dL — ABNORMAL LOW (ref 12.0–15.0)
MCH: 30.1 pg (ref 26.0–34.0)
MCHC: 32.8 g/dL (ref 30.0–36.0)
MCV: 91.7 fL (ref 80.0–100.0)
Platelets: 247 K/uL (ref 150–400)
RBC: 3.49 MIL/uL — ABNORMAL LOW (ref 3.87–5.11)
RDW: 16.3 % — ABNORMAL HIGH (ref 11.5–15.5)
WBC: 16.9 K/uL — ABNORMAL HIGH (ref 4.0–10.5)
nRBC: 3.9 % — ABNORMAL HIGH (ref 0.0–0.2)

## 2024-08-08 LAB — PHOSPHORUS: Phosphorus: 3.4 mg/dL (ref 2.5–4.6)

## 2024-08-08 LAB — MAGNESIUM: Magnesium: 2 mg/dL (ref 1.7–2.4)

## 2024-08-08 MED ORDER — POTASSIUM CHLORIDE CRYS ER 20 MEQ PO TBCR
20.0000 meq | EXTENDED_RELEASE_TABLET | ORAL | Status: DC
  Filled 2024-08-08: qty 1

## 2024-08-08 MED ORDER — POTASSIUM CHLORIDE 10 MEQ/50ML IV SOLN
10.0000 meq | INTRAVENOUS | Status: AC
  Administered 2024-08-08 (×4): 10 meq via INTRAVENOUS
  Filled 2024-08-08 (×4): qty 50

## 2024-08-08 MED ORDER — NUTRISOURCE FIBER PO PACK
1.0000 | PACK | Freq: Four times a day (QID) | ORAL | Status: DC
  Administered 2024-08-08 – 2024-08-13 (×22): 1
  Filled 2024-08-08 (×23): qty 1

## 2024-08-08 MED ORDER — PHENYLEPHRINE HCL-NACL 20-0.9 MG/250ML-% IV SOLN
0.0000 ug/min | INTRAVENOUS | Status: DC
  Administered 2024-08-08: 20 ug/min via INTRAVENOUS
  Administered 2024-08-09: 30 ug/min via INTRAVENOUS
  Filled 2024-08-08 (×2): qty 250

## 2024-08-08 MED ORDER — POTASSIUM CHLORIDE 20 MEQ PO PACK
20.0000 meq | PACK | ORAL | Status: AC
  Administered 2024-08-08 (×2): 20 meq
  Filled 2024-08-08 (×2): qty 1

## 2024-08-08 MED ORDER — VASOPRESSIN 20 UNITS/100 ML INFUSION FOR SHOCK
0.0400 [IU]/min | INTRAVENOUS | Status: DC
  Administered 2024-08-08 – 2024-08-09 (×5): 0.04 [IU]/min via INTRAVENOUS
  Filled 2024-08-08 (×5): qty 100

## 2024-08-08 NOTE — Progress Notes (Signed)
 eLink Physician-Brief Progress Note Patient Name: Daisy [name] DOB: 13-Apr-1956 MRN: [id number]   Date of Service  08/08/2024  HPI/Events of Note  Acute subarachnoid hemorrhage secondary to ACOM aneurysm, known mobile mass on the mitral valve.  SBP goal greater than 160, currently on norepinephrine  at 34 mcg, rapidly escalating.  Split dose nimodipine  in place.  eICU Interventions  Add vasopressin  fixed, phenylephrine  titratable PICC line in place     Intervention Category Intermediate Interventions: Hypotension - evaluation and management  Lazer Wollard 08/08/2024, 2:14 AM

## 2024-08-08 NOTE — Progress Notes (Signed)
 NAME:  Daisy [name], MRN:  [id number], DOB:  1955/12/01, LOS: 14 ADMISSION DATE:  07/25/2024, CONSULTATION DATE:  07/25/2024 REFERRING MD: Darnella - NSGY, CHIEF COMPLAINT:  Sepsis/SAH   History of Present Illness:  68 year old woman who presented to [facility] 8/31 for AMS and not feel well x 1 week. PMHx significant for HTN, COPD (not on home O2), DMT2, vitamin D deficiency, GERD, chronic back pain, EtOH abuse, polysubstance abuse.  Per report, patient was confused and feeling unwell x 1 week and was found down at home. Patient's son called 911. EMS found patient on the ground and drenched in urine with tachycardia and temp 99.83F. Patient was reportedly more confused on day of admission and had been urinating frequently and on herself and having headaches.  She drinks 2-6 beers daily and uses cocaine (last 1 week PTA).  On ED arrival, labs were notable for K 2.6, AG 17, LA 3--->5.9, WBC 17.8. Working diagnosis was sepsis and patient was started on antibiotics.  Due to confusion, CT Head performed demonstrating SAH.  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  CT chest/abd/pelvis did not reveal any acute issues. Transferred to [facility] for further care.  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 Spot EEG negative for seizures. Permissive HTN, off of Cleviprex  per NSGY.  Echocardiogram showed  hyperechoic mobile density in the anterior left hip of the mitral valve.  LVEF 60 to 65% normal RV function. Mental status declined on 9/1 with worsening lethargy no longer following commands.  CT was initially negative for change, EEG has been negative for seizure.  BP parameters changed to make goal 180-200 for permissive hypertension on 9/2.  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  Mental status continues to be poor.  Cultures still negative. 9/6: Tracking today.  Saying ouch to painful stimuli. 9/9: stable. Still on levo/vaso for SBP >160 9/10 EVD removed. Wide BP swings. Intermittent fevers. Max 102.5. 9/12 arterial line removed, on intermittent levophed  for nimotop   Interim History / Subjective:  Back on vasopressin  and levophed  after nimotop  administration No fevers overnight  Palliative care met with family yesterday  Objective:   Blood pressure (!) 165/116, pulse (!) 107, temperature 98.4 F (36.9 C), temperature source Axillary, resp. rate (!) 29, height 5' 6 (1.676 m), weight 57.6 kg, SpO2 100%.        Intake/Output Summary (Last 24 hours) at 08/08/2024 0717 Last data filed at 08/08/2024 0700 Gross per 24 hour  Intake 4591.92 ml  Output 3350 ml  Net 1241.92 ml   Filed Weights   08/06/24 0545 08/07/24 0600 08/08/24 0500  Weight: 59.7 kg 57.8 kg 57.6  kg   Physical Examination:  General:Adult female in NAD Lungs: Clear bilateral breath sounds Heart: Tachy, regular, no MRG Abdomen: Soft, NT. ND Neuro: Eyes open, tracks. Does not follow commands.   Resolved Problem List:  Lactic acidosis resolved 9/1 SIRS: off abx.   Assessment and Plan:  aSAH due  to ruptured ACOM aneurysm status post coiling, associated hydrocephalus status post EVD Acute encephalopathy due to [facility] CTA 9/2: Showing left acute infarct which is new along with vasospasm in ACA, R MCA, left PCA territory.  Prior SAH, IVH and ventricular enlargement have improved. MRI done showed large left ACA acute infarct without midline shift and other multiple vascular territory infarct. Repeat head CT 9/10 stable from 9/5 with scattered bilateral infarcts and stable volume of SAH, IVH. EVD removed 9/10. 7 days Keppra  ended 9/9. - Continue nimodipine  half dose - SBP goal > 160 mmHg.  Levophed /vasopressin  as needed. Continue midodrine . - TCD's with poor windows - Neurosurgery following - Maintain normal sodium levels - continue free water  to 300mL q4hrs.   Fever  Leukocytosis  Suspect fever is neuro related. Procal 9/8 negative. CXR does not demonstrate a pneumonia. EVD was removed 9/10.  - UA negative, Cx pending.  - Maintain PICC for now. - antibiotics stopped yesterday  Sinus tachycardia  Suspect this is neuro related. 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  Blood cultures have been negative.  Presented with elevated lactate but now normal. Per NSGY, aneurysm position less likely to be secondary to mycotic aneurysm.  - query multiple infarcts on CT related to possible vegetation  - cardiology deferred TEE until patient has improved  - repeat blood cultures pending   History of COPD  Per family no history of smoking.  However reportedly has COPD. - duonebs prn   History of ETOH use Polysubstance abuse, cocaine  Chronic pain  - Continue thiamine , folate, multivitamin supplementation - Seizure precautions  History of HTN: - home regimen on hold with need for pressors for SBP goal   DMT2:  - Continue SSI, every 4  hours - CBG goal 140-180  Physical deconditioning: At risk for malnutrition: - PT/OT/SLP - Continue tube feeds via cortrak - protein supplementation, multivitamin, thiamine , folic acid   GOC: palliative care following  CC time: 31 min   Dorn Chill, MD [facility] Office: [phone]   See Amion for personal pager PCCM on call pager [phone] until 7pm. Please call Elink 7p-7a. [phone]

## 2024-08-08 NOTE — Progress Notes (Signed)
 [facility] ADULT ICU REPLACEMENT PROTOCOL   The patient does apply for the [facility] Adult ICU Electrolyte Replacment Protocol based on the criteria listed below:   1.Exclusion criteria: TCTS, ECMO, Dialysis, and Myasthenia Gravis patients 2. Is GFR >/= 30 ml/min? Yes.    Patient's GFR today is >60 3. Is SCr </= 2? Yes.   Patient's SCr is 0.59 mg/dL 4. Did SCr increase >/= 0.5 in 24 hours? No. 5.Pt's weight >40kg  Yes.   6. Abnormal electrolyte(s): K  7. Electrolytes replaced per protocol 8.  Call MD STAT for K+ </= 2.5, Phos </= 1, or Mag </= 1 Physician:  [place] E Tandre Conly 08/08/2024 6:00 AM

## 2024-08-08 NOTE — Plan of Care (Signed)
 Problem: Fluid Volume: Goal: Hemodynamic stability will improve Outcome: Not Progressing   Problem: Clinical Measurements: Goal: Diagnostic test results will improve Outcome: Not Progressing Goal: Signs and symptoms of infection will decrease Outcome: Not Progressing   Problem: Respiratory: Goal: Ability to maintain adequate ventilation will improve Outcome: Not Progressing   Problem: Education: Goal: Knowledge of General Education information will improve Description: Including pain rating scale, medication(s)/side effects and non-pharmacologic comfort measures Outcome: Not Progressing   Problem: Health Behavior/Discharge Planning: Goal: Ability to manage health-related needs will improve Outcome: Not Progressing   Problem: Clinical Measurements: Goal: Ability to maintain clinical measurements within normal limits will improve Outcome: Not Progressing Goal: Will remain free from infection Outcome: Not Progressing Goal: Diagnostic test results will improve Outcome: Not Progressing Goal: Respiratory complications will improve Outcome: Not Progressing Goal: Cardiovascular complication will be avoided Outcome: Not Progressing   Problem: Activity: Goal: Risk for activity intolerance will decrease Outcome: Not Progressing   Problem: Nutrition: Goal: Adequate nutrition will be maintained Outcome: Not Progressing   Problem: Coping: Goal: Level of anxiety will decrease Outcome: Not Progressing   Problem: Elimination: Goal: Will not experience complications related to bowel motility Outcome: Not Progressing Goal: Will not experience complications related to urinary retention Outcome: Not Progressing   Problem: Pain Managment: Goal: General experience of comfort will improve and/or be controlled Outcome: Not Progressing   Problem: Safety: Goal: Ability to remain free from injury will improve Outcome: Not Progressing   Problem: Skin Integrity: Goal: Risk for  impaired skin integrity will decrease Outcome: Not Progressing   Problem: Education: Goal: Ability to describe self-care measures that may prevent or decrease complications (Diabetes Survival Skills Education) will improve Outcome: Not Progressing Goal: Individualized Educational Video(s) Outcome: Not Progressing   Problem: Coping: Goal: Ability to adjust to condition or change in health will improve Outcome: Not Progressing   Problem: Fluid Volume: Goal: Ability to maintain a balanced intake and output will improve Outcome: Not Progressing   Problem: Health Behavior/Discharge Planning: Goal: Ability to identify and utilize available resources and services will improve Outcome: Not Progressing Goal: Ability to manage health-related needs will improve Outcome: Not Progressing   Problem: Metabolic: Goal: Ability to maintain appropriate glucose levels will improve Outcome: Not Progressing   Problem: Nutritional: Goal: Maintenance of adequate nutrition will improve Outcome: Not Progressing Goal: Progress toward achieving an optimal weight will improve Outcome: Not Progressing   Problem: Skin Integrity: Goal: Risk for impaired skin integrity will decrease Outcome: Not Progressing   Problem: Tissue Perfusion: Goal: Adequacy of tissue perfusion will improve Outcome: Not Progressing   Problem: Education: Goal: Knowledge of disease or condition will improve Outcome: Not Progressing Goal: Knowledge of secondary prevention will improve (MUST DOCUMENT ALL) Outcome: Not Progressing Goal: Knowledge of patient specific risk factors will improve (DELETE if not current risk factor) Outcome: Not Progressing   Problem: Spontaneous Subarachnoid Hemorrhage Tissue Perfusion: Goal: Complications of Spontaneous Subarachnoid Hemorrhage will be minimized Outcome: Not Progressing   Problem: Coping: Goal: Will verbalize positive feelings about self Outcome: Not Progressing Goal: Will  identify appropriate support needs Outcome: Not Progressing   Problem: Health Behavior/Discharge Planning: Goal: Ability to manage health-related needs will improve Outcome: Not Progressing Goal: Goals will be collaboratively established with patient/family Outcome: Not Progressing   Problem: Self-Care: Goal: Ability to participate in self-care as condition permits will improve Outcome: Not Progressing Goal: Verbalization of feelings and concerns over difficulty with self-care will improve Outcome: Not Progressing Goal: Ability to communicate needs accurately will  improve Outcome: Not Progressing   Problem: Nutrition: Goal: Risk of aspiration will decrease Outcome: Not Progressing Goal: Dietary intake will improve Outcome: Not Progressing

## 2024-08-08 NOTE — Progress Notes (Signed)
 No new issues or problems.  Patient remains awake and appears somewhat more aware.  She gets agitated with stimulation.  Is not following commands.  Upper extremity strength reasonably symmetric.  Continue supportive efforts.  Continue efforts at hydration and try to maintain good mean arterial pressure.  No new recommendations otherwise.

## 2024-08-08 NOTE — Progress Notes (Signed)
 Daily Progress Note   Patient Name: Daisy [name]       Date: 08/08/2024 DOB: 1956/02/03  Age: 68 y.o. MRN#: [id number] Attending Physician: Kara Dorn NOVAK, MD Primary Care Physician: Darra Hamilton, PA-C Admit Date: 07/25/2024  Reason for Consultation/Follow-up: Establishing goals of care  Subjective: Medical records reviewed including progress notes, labs and imaging. Patient assessed at the bedside. She is more alert today, shed a tear when I mentioned I would be calling her son. No family was present visiting. Discussed with RN.  Called patient's son Jermaine for ongoing palliative support and GOC discussions. He has not yet had the time to review Hard Choices booklet, but he is off tomorrow and the next day. I gently encouraged him to consider code status again. He feels that she is more aware today and trying to figure out what is going on. This is encouraging for him.  Questions and concerns addressed. PMT will continue to support holistically.   Length of Stay: 14   Physical Exam Vitals and nursing note reviewed.  Constitutional:      General: She is not in acute distress.    Appearance: She is ill-appearing.  HENT:     Head: Normocephalic and atraumatic.  Cardiovascular:     Rate and Rhythm: Tachycardia present.  Pulmonary:     Effort: Tachypnea present.  Skin:    General: Skin is warm and dry.  Neurological:     Mental Status: She is alert.  Psychiatric:        Speech: She is noncommunicative.             Vital Signs: BP (!) 183/101   Pulse (!) 111   Temp 98.7 F (37.1 C) (Axillary)   Resp (!) 29   Ht 5' 6 (1.676 m)   Wt 57.6 kg   SpO2 95%   BMI 20.50 kg/m  SpO2: SpO2: 95 % O2 Device: O2 Device: Room Air O2 Flow Rate: O2 Flow Rate (L/min): 2 L/min      Palliative Assessment/Data: 30% (on tube  feeds)   Palliative Care Assessment & Plan   Patient Profile: 68 y.o. female  with past medical history of HTN, COPD (not on home O2), DMT2, vitamin D deficiency, GERD, chronic back pain, EtOH abuse, polysubstance abuse admitted on 07/25/2024 with altered mental status after being found down by son.    Patient was initially admitted with a working diagnosis of sepsis and found to have SAH on CT head, aneurysm on CTA as likely source of SAH.  She had coiling on 9/1 with subsequent decline in mental status.  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  Echo on 9/3 showed concerns for vegetation, though unable to get TEE until patient improves further.  She is on and off pressors as needed with intermittent fevers.   PMT has been consulted to assist with goals of care conversation.  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 Recommended DNR/DNI, understanding evidenced-based poor outcomes in similar hospitalized patients, as the cause of the arrest is likely associated with terminal disease rather than a reversible acute cardio-pulmonary event Ongoing GOC discussions Psychosocial and emotional support provided PMT will continue to follow and support   Prognosis:  Guarded to Poor  Discharge Planning: To Be Determined   Care plan was discussed with RN, patient's son  MDM: High      Mikea Quadros SHAUNNA Fell, PA-C  Palliative Medicine Team Team phone # [phone]  Thank you for allowing the Palliative Medicine Team to assist in the care of this patient. Please utilize secure chat with additional questions, if there is no  response within 30 minutes please call the above phone number.  Palliative Medicine Team providers are available by phone from 7am to 7pm daily and can be reached through the team cell phone.  Should this patient require assistance outside of these hours, please call the patient's attending physician.

## 2024-08-09 ENCOUNTER — Inpatient Hospital Stay (HOSPITAL_COMMUNITY)

## 2024-08-09 DIAGNOSIS — Z7189 Other specified counseling: Secondary | ICD-10-CM | POA: Diagnosis not present

## 2024-08-09 DIAGNOSIS — D72829 Elevated white blood cell count, unspecified: Secondary | ICD-10-CM | POA: Diagnosis not present

## 2024-08-09 DIAGNOSIS — R6510 Systemic inflammatory response syndrome (SIRS) of non-infectious origin without acute organ dysfunction: Secondary | ICD-10-CM | POA: Diagnosis not present

## 2024-08-09 DIAGNOSIS — Z515 Encounter for palliative care: Secondary | ICD-10-CM | POA: Diagnosis not present

## 2024-08-09 DIAGNOSIS — I609 Nontraumatic subarachnoid hemorrhage, unspecified: Secondary | ICD-10-CM | POA: Diagnosis not present

## 2024-08-09 LAB — GLUCOSE, CAPILLARY
Glucose-Capillary: 118 mg/dL — ABNORMAL HIGH (ref 70–99)
Glucose-Capillary: 131 mg/dL — ABNORMAL HIGH (ref 70–99)
Glucose-Capillary: 146 mg/dL — ABNORMAL HIGH (ref 70–99)
Glucose-Capillary: 162 mg/dL — ABNORMAL HIGH (ref 70–99)
Glucose-Capillary: 168 mg/dL — ABNORMAL HIGH (ref 70–99)
Glucose-Capillary: 176 mg/dL — ABNORMAL HIGH (ref 70–99)

## 2024-08-09 LAB — COMPREHENSIVE METABOLIC PANEL WITH GFR
ALT: 38 U/L (ref 0–44)
AST: 34 U/L (ref 15–41)
Albumin: 2.7 g/dL — ABNORMAL LOW (ref 3.5–5.0)
Alkaline Phosphatase: 80 U/L (ref 38–126)
Anion gap: 12 (ref 5–15)
BUN: 14 mg/dL (ref 8–23)
CO2: 19 mmol/L — ABNORMAL LOW (ref 22–32)
Calcium: 8.2 mg/dL — ABNORMAL LOW (ref 8.9–10.3)
Chloride: 109 mmol/L (ref 98–111)
Creatinine, Ser: 0.5 mg/dL (ref 0.44–1.00)
GFR, Estimated: >60 mL/min (ref >60)
Glucose, Bld: 343 mg/dL — ABNORMAL HIGH (ref 70–99)
Potassium: 3.6 mmol/L (ref 3.5–5.1)
Sodium: 140 mmol/L (ref 135–145)
Total Bilirubin: 0.5 mg/dL (ref 0.0–1.2)
Total Protein: 6.5 g/dL (ref 6.5–8.1)

## 2024-08-09 LAB — CBC
HCT: 30.1 % — ABNORMAL LOW (ref 36.0–46.0)
Hemoglobin: 9.8 g/dL — ABNORMAL LOW (ref 12.0–15.0)
MCH: 30.3 pg (ref 26.0–34.0)
MCHC: 32.6 g/dL (ref 30.0–36.0)
MCV: 93.2 fL (ref 80.0–100.0)
Platelets: 209 K/uL (ref 150–400)
RBC: 3.23 MIL/uL — ABNORMAL LOW (ref 3.87–5.11)
RDW: 16.1 % — ABNORMAL HIGH (ref 11.5–15.5)
WBC: 16 K/uL — ABNORMAL HIGH (ref 4.0–10.5)
nRBC: 1.6 % — ABNORMAL HIGH (ref 0.0–0.2)

## 2024-08-09 LAB — CULTURE, BLOOD (ROUTINE X 2)
Culture: NO GROWTH
Culture: NO GROWTH

## 2024-08-09 LAB — BLOOD GAS, VENOUS
Acid-base deficit: 3.4 mmol/L — ABNORMAL HIGH (ref 0.0–2.0)
Bicarbonate: 21.4 mmol/L (ref 20.0–28.0)
O2 Saturation: 99.4 %
Patient temperature: 37
pCO2, Ven: 37 mmHg — ABNORMAL LOW (ref 44–60)
pH, Ven: 7.37 (ref 7.25–7.43)
pO2, Ven: 143 mmHg — ABNORMAL HIGH (ref 32–45)

## 2024-08-09 LAB — MAGNESIUM: Magnesium: 1.9 mg/dL (ref 1.7–2.4)

## 2024-08-09 LAB — PHOSPHORUS: Phosphorus: 4.2 mg/dL (ref 2.5–4.6)

## 2024-08-09 MED ORDER — POTASSIUM CHLORIDE 20 MEQ PO PACK
40.0000 meq | PACK | Freq: Once | ORAL | Status: AC
  Administered 2024-08-09: 40 meq
  Filled 2024-08-09: qty 2

## 2024-08-09 MED ORDER — MAGNESIUM SULFATE 2 GM/50ML IV SOLN
2.0000 g | Freq: Once | INTRAVENOUS | Status: AC
  Administered 2024-08-09: 2 g via INTRAVENOUS
  Filled 2024-08-09: qty 50

## 2024-08-09 MED ORDER — MIDODRINE HCL 5 MG PO TABS
15.0000 mg | ORAL_TABLET | Freq: Three times a day (TID) | ORAL | Status: DC
  Administered 2024-08-09 – 2024-08-11 (×8): 15 mg
  Filled 2024-08-09 (×8): qty 3

## 2024-08-09 NOTE — Progress Notes (Signed)
 Nutrition Follow-up  DOCUMENTATION CODES:   Non-severe (moderate) malnutrition in context of social or environmental circumstances  INTERVENTION:  Continue Tube feeding via Cortrak tube: Osmolite 1.5 @50  ml/hr (1200 ml per day) Prosource TF20 60 ml BID   Provides 1960 kcal, 115 gm protein, 912 ml free water  daily   Continue MVI with minerals, folic acid  and thiamine    NUTRITION DIAGNOSIS:   Moderate Malnutrition related to social / environmental circumstances as evidenced by moderate fat depletion, severe muscle depletion. - Ongoing   GOAL:   Patient will meet greater than or equal to 90% of their needs - Progressing   MONITOR:   TF tolerance, Labs  REASON FOR ASSESSMENT:   Consult Enteral/tube feeding initiation and management  ASSESSMENT:   Pt with PMH of COPD (no home O2), DM, Vitamin D deficiency, GERD, chronic back pain, ETOH abuse, and polysubstance abuse (cocaine). Pt was not feeling well x 1 week PTA and found down at home by son, admitted with [facility] secondary to ruptured aneurysm.  9/1 - s/p EVD placement and coiling  9/2 - s/p cortrak placement; tip distal stomach per xray 9/3 Echo showed possible signs of vegetation  9/10 - EVD removed    Pt opens eyes but does not follow commands. On [place] 2L. Remains on NE, Vaso, Phenylephrine . Pressor requirements have been increasing for SBP goal of >160.  Tolerating tube feeds at goal. Having BM, on Imodium  and fiber. Ongoing GOC.   Temp (24hrs), Avg:99.4 F (37.4 C), Min:99.1 F (37.3 C), Max:99.8 F (37.7 C) MAP (cuff):  Admit weight: 67 kg - ? Accuracy   Current weight: 59.7 kg   Intake/Output Summary (Last 24 hours) at 08/09/2024 1118 Last data filed at 08/09/2024 1118 Gross per 24 hour  Intake 5176.35 ml  Output 2875 ml  Net 2301.35 ml   Net IO Since Admission: 12,433.78 mL [08/09/24 1118]  Drains/Lines: FMS: 275 ml x 24 hours   Average Meal Intake: NPO  Nutritionally Relevant  Medications: Scheduled Meds:  feeding supplement (PROSource TF20)  60 mL Per Tube BID   fiber  1 packet Per Tube QID   folic acid   1 mg Per Tube Daily   free water   300 mL Per Tube Q4H   insulin  aspart  0-9 Units Subcutaneous Q4H   multivitamin with minerals  1 tablet Per Tube Daily   thiamine   100 mg Per Tube Daily   Continuous Infusions:  feeding supplement (OSMOLITE 1.5 CAL) 50 mL/hr at 08/09/24 1022   magnesium  sulfate bolus IVPB     norepinephrine  (LEVOPHED ) Adult infusion 28 mcg/min (08/09/24 1022)   vasopressin  0.04 Units/min (08/09/24 1022)   Labs Reviewed: Calcium 8.2 CBG ranges from 146-174 mg/dL over the last 24 hours HgbA1c 6.3  Diet Order:   Diet Order             Diet NPO time specified  Diet effective midnight                   EDUCATION NEEDS:   Not appropriate for education at this time  Skin:  Skin Assessment: Skin Integrity Issues: Skin Integrity Issues:: Other (Comment) Other: Wound on buttocks  Last BM:  9/15 via FMS type 7  Height:   Ht Readings from Last 1 Encounters:  07/25/24 5' 6 (1.676 m)    Weight:   Wt Readings from Last 1 Encounters:  08/09/24 59.7 kg    Ideal Body Weight:  59.1 kg  BMI:  Body mass  index is 21.24 kg/m.  Estimated Nutritional Needs:   Kcal:  1800-2000  Protein:  110-120 grams  Fluid:  >1.8 L/day   Olivia Kenning, RD Registered Dietitian  See Amion for more information

## 2024-08-09 NOTE — Progress Notes (Signed)
 Occupational Therapy Treatment Patient Details Name: Daisy [name] MRN: [id number] DOB: October 08, 1956 Today's Date: 08/09/2024   History of present illness 68 yo female adm 07/25/24 after found on ground with confusion 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 S/p diagnostic cerebral angiogram, coil embolization of ACA aneurysm 9/1. EEG 9/2 suggestive of moderate to severe diffuse encephalopathy. PMHx: HTN, polysubstance abuse, chronic back pain, T2DM, COPD.  L Resting Hand splint added 9/12.   OT comments  Pt still with no command follow, L head turn and preference for L gaze, max physical assist to rotate head to midline and then pt with brief instances of tracking to R. Pt able to sit EOB max +2, minA for sitting balance at best, pt with L lateral, posterior, and anterior leans during session. Pt presenting with impairments listed below, will follow acutely. Patient will benefit from continued inpatient follow up therapy, <3 hours/day to maximize safety/ind with ADL/functional mobility.       If plan is discharge home, recommend the following:  Two people to help with walking and/or transfers;Two people to help with bathing/dressing/bathroom;Assistance with cooking/housework;Assistance with feeding;Direct supervision/assist for medications management;Direct supervision/assist for financial management;Assist for transportation;Help with stairs or ramp for entrance;Supervision due to cognitive status   Equipment Recommendations  None recommended by OT    Recommendations for Other Services PT consult    Precautions / Restrictions Precautions Precautions: Fall Recall of Precautions/Restrictions: Impaired Precaution/Restrictions Comments: SBP now okay >120; on pressors Required Braces or Orthoses: Other Brace Other Brace: L resting hand splint Restrictions Weight Bearing Restrictions Per Provider Order: No       Mobility Bed Mobility Overal bed mobility: Needs Assistance Bed Mobility:  Supine to Sit, Sit to Supine     Supine to sit: Total assist, +2 for physical assistance Sit to supine: Total assist, +2 for physical assistance   General bed mobility comments: L lateral and anterior/posterior lean noted during session    Transfers                   General transfer comment: deferred     Balance Overall balance assessment: Needs assistance Sitting-balance support: No upper extremity supported, Feet unsupported Sitting balance-Leahy Scale: Zero   Postural control: Posterior lean                                 ADL either performed or assessed with clinical judgement   ADL Overall ADL's : Needs assistance/impaired                                       General ADL Comments: dependent for all ADLs    Extremity/Trunk Assessment Upper Extremity Assessment RUE Deficits / Details: no command following, PROM WFL, RUE Sensation: decreased light touch;decreased proprioception RUE Coordination: decreased fine motor;decreased gross motor LUE Deficits / Details: incr tone to elbow and shoulder,resting hand splint placed 9/12. LUE Sensation: decreased light touch;decreased proprioception LUE Coordination: decreased fine motor;decreased gross motor   Lower Extremity Assessment Lower Extremity Assessment: Defer to PT evaluation        Vision   Vision Assessment?: Vision impaired- to be further tested in functional context   Perception Perception Perception: Not tested   Praxis Praxis Praxis: Not tested   Communication Communication Communication: Impaired Factors Affecting Communication: Difficulty expressing self   Cognition Arousal: Alert Behavior  During Therapy: Flat affect               OT - Cognition Comments: No command following noted                 Following commands: Impaired Following commands impaired:  (no command follow)      Cueing   Cueing Techniques: Gestural cues, Verbal cues,  Tactile cues, Visual cues  Exercises      Shoulder Instructions       General Comments SBP >120 throughout session    Pertinent Vitals/ Pain       Pain Assessment Pain Assessment: Faces Pain Score: 6  Faces Pain Scale: Hurts even more Pain Location: grimacing with mobility Pain Descriptors / Indicators: Moaning, Grimacing Pain Intervention(s): Limited activity within patient's tolerance, Monitored during session, Repositioned  Home Living                                          Prior Functioning/Environment              Frequency  Min 1X/week        Progress Toward Goals  OT Goals(current goals can now be found in the care plan section)  Progress towards OT goals: Not progressing toward goals - comment  Acute Rehab OT Goals Patient Stated Goal: none stated OT Goal Formulation: Patient unable to participate in goal setting Time For Goal Achievement: 08/23/24 Potential to Achieve Goals: Fair ADL Goals Pt Will Perform Grooming: with mod assist;bed level Pt/caregiver will Perform Home Exercise Program: Both right and left upper extremity;With written HEP provided;With minimal assist Additional ADL Goal #1: pt will follow 1 step command 50% of time in prep for ADLs Additional ADL Goal #2: pt will demo fair sitting balance in prep for ADLs Additional ADL Goal #3: pt will track to midline to improve visual scanning for ADLs  Plan      Co-evaluation    PT/OT/SLP Co-Evaluation/Treatment: Yes Reason for Co-Treatment: Complexity of the patient's impairments (multi-system involvement);Necessary to address cognition/behavior during functional activity;To address functional/ADL transfers;For patient/therapist safety PT goals addressed during session: Mobility/safety with mobility;Balance OT goals addressed during session: ADL's and self-care      AM-PAC OT 6 Clicks Daily Activity     Outcome Measure   Help from another person eating meals?:  Total Help from another person taking care of personal grooming?: Total Help from another person toileting, which includes using toliet, bedpan, or urinal?: Total Help from another person bathing (including washing, rinsing, drying)?: Total Help from another person to put on and taking off regular upper body clothing?: Total Help from another person to put on and taking off regular lower body clothing?: Total 6 Click Score: 6    End of Session    OT Visit Diagnosis: Unsteadiness on feet (R26.81);Other abnormalities of gait and mobility (R26.89);Muscle weakness (generalized) (M62.81);History of falling (Z91.81)   Activity Tolerance Patient tolerated treatment well   Patient Left in bed;with call bell/phone within reach;with bed alarm set   Nurse Communication Mobility status;Other (comment) (splint left off, skin breakdown on posterior L ear)        Time: 9057-8993 OT Time Calculation (min): 24 min  Charges: OT General Charges $OT Visit: 1 Visit OT Treatments $Self Care/Home Management : 8-22 mins  Damaria Stofko K, OTD, OTR/L SecureChat Preferred Acute Rehab [phone]   Laneta POUR  Koonce 08/09/2024, 12:27 PM

## 2024-08-09 NOTE — TOC Progression Note (Signed)
 Transition of Care [facility]) - Progression Note    Patient Details  Name: FRANSHESKA WILLINGHAM MRN: [id number] Date of Birth: [birth year]  [facility] of [facility]) CM/SW Contact  Inocente GORMAN Kindle, LCSW Phone Number: 08/09/2024, 5:15 PM  Clinical Narrative:    CSW continuing to follow. Patient still with [facility].    Expected Discharge Plan: Skilled Nursing Facility Barriers to Discharge: Continued Medical Work up, English as a second language teacher, SNF Pending bed offer               Expected Discharge Plan and Services In-house Referral: Clinical Social Work     Living arrangements for the past 2 months: Single Family Home                                       Social Drivers of Health (SDOH) Interventions SDOH Screenings   Tobacco Use: Low Risk  (07/26/2024)    Readmission Risk Interventions     No data to display

## 2024-08-09 NOTE — Progress Notes (Signed)
  NEUROSURGERY PROGRESS NOTE   Pt seen and examined. No issues overnight. Has requiring multiple pressors for even 160 mmHg SBP.  EXAM: Temp:  [99.1 F (37.3 C)-99.8 F (37.7 C)] 99.1 F (37.3 C) (09/15 0800) Pulse Rate:  [89-112] 93 (09/15 1015) Resp:  [13-53] 24 (09/15 1015) BP: (124-199)/(72-115) 150/82 (09/15 1015) SpO2:  [93 %-100 %] 98 % (09/15 1015) Weight:  [59.7 kg] 59.7 kg (09/15 0500) Intake/Output      09/14 0701 09/15 0700 09/15 0701 09/16 0700   I.V. (mL/kg) 1242.1 (20.8) 236.4 (4)   Other 60    NG/GT 3640 468.3   IV Piggyback 186.9    Total Intake(mL/kg) 5129 (85.9) 704.7 (11.8)   Urine (mL/kg/hr) 3250 (2.3)    Stool 275    Total Output 3525    Net +1604 +704.7        Urine Occurrence 1 x    Stool Occurrence 2 x     Eyes open spontaneously Occasionally tracks Occasionally says ouch to central pain Not following commands Localizing LUE, minimal w/d RUE. Minimal w/d BLE Right groin site soft  LABS: Lab Results  Component Value Date   CREATININE 0.50 08/09/2024   BUN 14 08/09/2024   NA 140 08/09/2024   K 3.6 08/09/2024   CL 109 08/09/2024   CO2 19 (L) 08/09/2024   Lab Results  Component Value Date   WBC 16.0 (H) 08/09/2024   HGB 9.8 (L) 08/09/2024   HCT 30.1 (L) 08/09/2024   MCV 93.2 08/09/2024   PLT 209 08/09/2024    IMPRESSION: - 68 y.o. female SAH d# 14 s/p coil embolization of Acom aneurysm. Remains obtunded after coiling which did reveal significant ACA spasm. Spot EEG negative. Left ACA territory stroke and multiple smaller embolic strokes on recent MRI - Vasospasm seen on initial angiogram, TCD with poor sonographic windows, largely unhelpful. Pts clinical exam has been completely unchanged essentially since coiling procedure despite aggressive medical treatment. At this point we can therefore relax blood pressure parameters in an attempt to wean pressors. - TTE reveals possible mitral vegetation, no plan for further eval currently. -  HCP resolved.  PLAN: - Can aim for normotension and wean pressors - Cont Nimotop  split Q2, although at this point if she is very labile can consider d/c - Cont PT/OT   Gerldine Maizes, MD [facility]

## 2024-08-09 NOTE — Progress Notes (Signed)
 Pharmacy Electrolyte Replacement  Recent Labs:  Recent Labs    08/09/24 0543  K 3.6  MG 1.9  PHOS 4.2  CREATININE 0.50    Low Critical Values (K </= 2.5, Phos </= 1, Mg </= 1) Present: None  Plan: Give KCL 40 mEq per tube and magnesium  sulfate 2 g IV once.   Neala Miggins, PharmD

## 2024-08-09 NOTE — Progress Notes (Signed)
   08/09/24 0030  Provider Notification  Provider Name/Title [facility]  Date Provider Notified 08/09/24  Time Provider Notified 0030  Method of Notification Call  Notification Reason Change in status (SBP 142 despite being maxxed on quad strength levophed  gtt @ 40 mcg/min, vasopressin  @ 0.04 unit/min, and increasing phenylephrine  gtt currently at 40 mcg/min. Pt also tachypneic to the 50's)  Provider response Evaluate remotely;See new orders

## 2024-08-09 NOTE — Progress Notes (Signed)
 Per Dr. Lanis: staples can be removed Friday, 9/19, by nursing staff.

## 2024-08-09 NOTE — Progress Notes (Signed)
 Heard patient say ouch. This is first verbalization I have heard of pain/ any word choice.

## 2024-08-09 NOTE — Progress Notes (Signed)
 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 S/p diagnostic cerebral angiogram, coil embolization of ACA aneurysm 9/1. EEG 9/2 suggestive of moderate to severe diffuse encephalopathy. PMHx: HTN, polysubstance abuse, chronic back pain, T2DM, COPD.  L Resting Hand splint added 9/12.    PT Comments  PT focus of session on tolerance for upright, seated balance at EOB. Pt requiring total +2 all aspects of mobility, once EOB pt able to sit for periods of supervision/CGA but with fatigue pt requiring significant posterior truncal support. Pt demonstrating some attempts at righting reactions with LUE engagement, but fatigues quickly with this. Pt tracking towards R x2 across midline, benefits from PT/OT physically moving head to midline first as pt is in significant L cervical rotation at rest and with activity. PT to continue to follow.     If plan is discharge home, recommend the following: Two people to help with walking and/or transfers;Two people to help with bathing/dressing/bathroom   Can travel by private Environmental consultant;Hospital bed    Recommendations for Other Services       Precautions / Restrictions Precautions Precautions: Fall Precaution/Restrictions Comments: SBP now okay >120; on pressors Restrictions Weight Bearing Restrictions Per Provider Order: No     Mobility  Bed Mobility Overal bed mobility: Needs Assistance Bed Mobility: Supine to Sit, Sit to Supine     Supine to sit: Total assist, +2 for physical assistance Sit to supine: Total assist, +2 for physical assistance   General bed mobility comments: assist for all aspects, benefits from +2 support for anterior and posterior support. Pt transitioned to chair position, then  progressed LEs over EOB towards L.    Transfers                   General transfer comment: deferred    Ambulation/Gait                   Stairs             Wheelchair Mobility     Tilt Bed    Modified Rankin (Stroke Patients Only) Modified Rankin (Stroke Patients Only) Pre-Morbid Rankin Score: No symptoms Modified Rankin: Severe disability     Balance Overall balance assessment: Needs assistance Sitting-balance support: No upper extremity supported, Feet unsupported Sitting balance-Leahy Scale: Zero Sitting balance - Comments: periods of supervision initially, transitioning to mod truncal assist posteriorly for posterior bias. EOB sitting x15 minutes, attempted to engage pt in elbow propping L, righting reactions (min compensatory LUE engagement with posterior LOB), and ADL tasks (washing face with wet wash cloth) Postural control: Posterior lean                                  Communication Communication Communication: Impaired Factors Affecting Communication: Difficulty expressing self  Cognition Arousal: Alert Behavior During Therapy: Flat affect   PT - Cognitive impairments: Difficult to assess                       PT - Cognition Comments: no command following Following commands: Intact      Cueing Cueing Techniques: Gestural cues, Verbal cues, Tactile cues, Visual cues  Exercises  General Comments General comments (skin integrity, edema, etc.): vss, SBP 124-140 throughout      Pertinent Vitals/Pain Pain Assessment Pain Assessment: Faces Faces Pain Scale: Hurts even more Pain Location: grimacing with mobility Pain Descriptors / Indicators: Moaning, Grimacing Pain Intervention(s): Limited activity within patient's tolerance, Monitored during session, Repositioned    Home Living                          Prior Function            PT Goals (current goals can now be found in the care  plan section) Acute Rehab PT Goals Patient Stated Goal: pt unable to state PT Goal Formulation: With patient/family Time For Goal Achievement: 08/23/24 Potential to Achieve Goals: Poor Progress towards PT goals: Not progressing toward goals - comment (total +2, no command following)    Frequency    Min 1X/week      PT Plan      Co-evaluation PT/OT/SLP Co-Evaluation/Treatment: Yes Reason for Co-Treatment: Complexity of the patient's impairments (multi-system involvement);Necessary to address cognition/behavior during functional activity;To address functional/ADL transfers;For patient/therapist safety PT goals addressed during session: Mobility/safety with mobility;Balance        AM-PAC PT 6 Clicks Mobility   Outcome Measure  Help needed turning from your back to your side while in a flat bed without using bedrails?: Total Help needed moving from lying on your back to sitting on the side of a flat bed without using bedrails?: Total Help needed moving to and from a bed to a chair (including a wheelchair)?: Total Help needed standing up from a chair using your arms (e.g., wheelchair or bedside chair)?: Total Help needed to walk in hospital room?: Total Help needed climbing 3-5 steps with a railing? : Total 6 Click Score: 6    End of Session   Activity Tolerance: Patient limited by fatigue;Patient tolerated treatment well Patient left: in bed;with call bell/phone within reach;with bed alarm set Nurse Communication: Mobility status PT Visit Diagnosis: Other abnormalities of gait and mobility (R26.89);Muscle weakness (generalized) (M62.81)     Time: 9056-8993 PT Time Calculation (min) (ACUTE ONLY): 23 min  Charges:    $Neuromuscular Re-education: 8-22 mins PT General Charges $$ ACUTE PT VISIT: 1 Visit                     Johana RAMAN, PT DPT Acute Rehabilitation Services Secure Chat Preferred  Office [phone]    Caitlain Tweed E Johna 08/09/2024, 10:53 AM

## 2024-08-09 NOTE — Progress Notes (Signed)
 NAME:  Daisy [name], MRN:  [id number], DOB:  09-21-1956, LOS: 15 ADMISSION DATE:  07/25/2024, CONSULTATION DATE:  07/25/2024 REFERRING MD: Darnella - NSGY, CHIEF COMPLAINT:  Sepsis/SAH   History of Present Illness:  68 year old woman who presented to [facility] 8/31 for AMS and not feel well x 1 week. PMHx significant for HTN, COPD (not on home O2), DMT2, vitamin D deficiency, GERD, chronic back pain, EtOH abuse, polysubstance abuse.  Per report, patient was confused and feeling unwell x 1 week and was found down at home. Patient's son called 911. EMS found patient on the ground and drenched in urine with tachycardia and temp 99.20F. Patient was reportedly more confused on day of admission and had been urinating frequently and on herself and having headaches.  She drinks 2-6 beers daily and uses cocaine (last 1 week PTA).  On ED arrival, labs were notable for K 2.6, AG 17, LA 3--->5.9, WBC 17.8. Working diagnosis was sepsis and patient was started on antibiotics.  Due to confusion, CT Head performed demonstrating SAH.  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  CT chest/abd/pelvis did not reveal any acute issues. Transferred to [facility] for further care.  PCCM consulted  Pertinent Medical History:  HTN, COPD (not on home O2), DMT2, Vitamin D deficiency, GERD, chronic back pain, EtOH abuse, polysubstance abuse   Significant Hospital Events: Including procedures, antibiotic start and stop dates in addition to other pertinent events   8/31 - Transferred to [facility] ICU from [facility] acute SAH d/t ruptured Acomm and associated Hydrocephalus 9/1 - Underwent Acomm aneurysm coiling (NSGY) 9/2 Per RN, decreased responsiveness since coiling; prior to intervention was alert and oriented to self, talking/following commands. Spot EEG negative for seizures. Permissive HTN, off of Cleviprex  per NSGY.  Echocardiogram showed  hyperechoic mobile density in the anterior left hip of the mitral valve.  LVEF 60 to 65% normal RV function. Mental status declined on 9/1 with worsening lethargy no longer following commands.  CT was initially negative for change, EEG has been negative for seizure.  BP parameters changed to make goal 180-200 for permissive hypertension on 9/2.  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  Mental status continues to be poor.  Cultures still negative. 9/6: Tracking today.  Saying ouch to painful stimuli. 9/9: stable. Still on levo/vaso for SBP >160 9/10 EVD removed. Wide BP swings. Intermittent fevers. Max 102.5. 9/12 arterial line removed, on intermittent levophed  for nimotop   Interim History / Subjective:  Remains on NE, Vaso, Phenylephrine . SBP high 140s   Objective:   Blood pressure 138/77, pulse 90, temperature 99.8 F (37.7 C), temperature source Axillary, resp. rate (!) 21, height 5' 6 (1.676 m), weight 59.7 kg, SpO2 98%.        Intake/Output Summary (Last 24 hours) at 08/09/2024 0830 Last data filed at 08/09/2024 0736 Gross per 24 hour  Intake 4879.66 ml  Output 3525 ml  Net 1354.66 ml   Filed Weights   08/07/24 0600 08/08/24 0500 08/09/24 0500  Weight: 57.8 kg 57.6 kg 59.7 kg   Physical Examination:  General:Adult female in NAD Lungs:  Clear bilateral breath sounds Heart: Tachy, regular, no MRG Abdomen: Soft, NT. ND Neuro: Eyes open, tracks. Does not follow commands.   Resolved Problem List:  Lactic acidosis resolved 9/1 SIRS: off abx.   Assessment and Plan:   aSAH due to ruptured ACOM aneurysm status post coiling, associated hydrocephalus status post  EVD Acute encephalopathy due to [facility] CTA 9/2: Showing left acute infarct which is new along with vasospasm in ACA, R MCA, left PCA territory.  Prior SAH, IVH and ventricular enlargement have improved. MRI done showed large left ACA acute infarct without midline shift and other multiple vascular territory infarct. Repeat head CT 9/10 stable from 9/5 with scattered bilateral infarcts and stable volume of SAH, IVH. EVD removed 9/10. 7 days Keppra  ended 9/9. - Continue nimodipine  half dose - Continue NE, Vaso, Phenylephrine , Midodrine  (increase from 10 to 15) as needed for goal SBP > 160 mmHg. - TCD's with poor windows - Neurosurgery following - Maintain normal sodium levels - continue free water  300mL q4hrs.   Fever - resolved Leukocytosis - stable. UA and CXR neg. Suspect fever is neuro related. Procal 9/8 negative. CXR does not demonstrate a pneumonia. EVD was removed 9/10.  - Maintain PICC for now. - antibiotics stopped 9/13, continue to hold  Sinus tachycardia  Suspect this is neuro related. 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  Blood cultures have been negative.  Presented with elevated lactate but now normal. Per NSGY, aneurysm position less likely to be secondary to mycotic aneurysm.  - query multiple infarcts on CT related to possible vegetation  - cardiology deferred TEE until patient has improved  - repeat blood cultures pending   History of COPD  Per family no history of smoking.  However reportedly has COPD. - duonebs prn   History of ETOH use Polysubstance abuse, cocaine  Chronic pain  - Continue thiamine , folate, multivitamin supplementation - Seizure precautions  History of HTN: - home regimen on hold with need for pressors for SBP goal   DMT2:  - Continue SSI, every 4 hours - CBG goal 140-180  Physical  deconditioning: At risk for malnutrition: - PT/OT/SLP - Continue tube feeds via [facility] - protein supplementation, multivitamin, thiamine , folic acid   GOC: palliative care following   CC time: 30 min.   Sammi Gore, PA - [facility] For pager details, please see AMION or use Epic chat  After 1900, please call [facility] for cross coverage needs 08/09/2024, 8:33 AM

## 2024-08-09 NOTE — Progress Notes (Signed)
 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  SBP goal greater than 160  Currently on norepinephrine , vasopressin  and escalating doses of phenylephrine   Notified that the patient is also more tachypneic and labored compared to normal.  This is verified on camera exam-tachypneic anywhere from 30-45 respirations per minute  eICU Interventions  Obtain VBG Obtain chest radiograph        Kesa Birky 08/09/2024, 12:58 AM

## 2024-08-09 NOTE — Progress Notes (Signed)
 Daily Progress Note   Patient Name: Daisy [name]       Date: 08/09/2024 DOB: [birth year]  Age: 68 y.o. MRN#: [id number] Attending Physician: Daisy Scholz, MD Primary Care Physician: Daisy Hamilton, PA-C Admit Date: 07/25/2024  Reason for Consultation/Follow-up: Establishing goals of care  Subjective: Medical records reviewed including progress notes, labs and imaging. Patient assessed at the bedside. She seems a little aware of the conversation, making concerned facial expressions on a couple of occasions. Her boyfriend Daisy [name] just arrived for his visit.  I introduced role of palliative medicine in the patient's care and explored Daisy [name]'s thoughts and feelings on patient's current illness. Explored any previous conversations held regarding her care preferences that they may have had in their 30+ years together. They have not had these discussions and he does not want to talk like there is no hope. He has strong hopes for her recovery. He also relies on her quite a bit due to limitations from his own lung disease. I provided education on the severity of patient's illness (he believed her stroke was mild) and shared my concern for poor quality of life with lack of further meaningful improvement.  Daisy [name] acknowledges that she would most likely prefer not to live than to have a permanent feeding tube, long-term nursing home placement, etc.) He is in a place of hoping that it doesn't come to that. Hard Choices booklet was provided for review as well as PMT contact information. Daisy [name] also expressed concerns with her son's ability to make decisions on her behalf. I provided education on [place] hierarchy of medical decision-making, as he is her legal next of kin.  I then called patient's son Daisy [name] for ongoing goals of care discussions and palliative support. Provided  update on the above conversation. He has not yet considered the difficult decisions that are likely to come, though he does not feel Daisy [name] should have input due to how long it took him to act when she began declining at home PTA. He shares that she is too young to be thinking about end of life. I provided extensive counseling on options including aggressive care vs comfort care. Reviewed that she is likely facing a prolonged recovery period, with high risk for additional acute medical complications, infections, bed sores and poor quality of life in the best case scenario. He is still overwhelmed but verbalizes that he understands rationale for my concern.   He is also still worried that she is being given too much pain medication. Provided reassurance and education on patient's underlying illness as the cause of her poor mental status rather than PRN opioids. Daisy [name] feels that the medical team should do anything they can to help her including CPR, though hesitant about a PEG. I provided education on the risks and benefits. He understands that this decision may be needed soon. I shared that I am back on service Friday and encouraged him to reach out to PMT phone line if needs arise in the meantime. He verbalized his appreciation.  Questions and concerns addressed. PMT will continue to support holistically.   Length of Stay: 15   Physical Exam Vitals and nursing note reviewed.  Constitutional:      General:  She is not in acute distress.    Appearance: She is ill-appearing.  HENT:     Head: Normocephalic and atraumatic.  Cardiovascular:     Rate and Rhythm: Tachycardia present.  Pulmonary:     Effort: Tachypnea present.  Skin:    General: Skin is warm and dry.  Neurological:     Mental Status: She is alert.  Psychiatric:        Speech: She is noncommunicative.             Vital Signs: BP (!) 150/82   Pulse 93   Temp 99.1 F (37.3 C) (Axillary)   Resp (!) 24   Ht 5' 6 (1.676 m)   Wt  59.7 kg   SpO2 98%   BMI 21.24 kg/m  SpO2: SpO2: 98 % O2 Device: O2 Device: Nasal Cannula O2 Flow Rate: O2 Flow Rate (L/min): 2 L/min      Palliative Assessment/Data: 30% (on tube feeds)   Palliative Care Assessment & Plan   Patient Profile: 67 y.o. female  with past medical history of HTN, COPD (not on home O2), DMT2, vitamin D deficiency, GERD, chronic back pain, EtOH abuse, polysubstance abuse admitted on 07/25/2024 with altered mental status after being found down by son.    Patient was initially admitted with a working diagnosis of sepsis and found to have SAH on CT head, aneurysm on CTA as likely source of SAH.  She had coiling on 9/1 with subsequent decline in mental status.  Repeat CTA then showed vasospasm, new large acute left anterior cerebral territory infarct, as well as small acute infarct in the PCA territory on the left on 9/2.  Echo on 9/3 showed concerns for vegetation, though unable to get TEE until patient improves further.  She is on and off pressors as needed with intermittent fevers.   PMT has been consulted to assist with goals of care conversation.  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  Recommendations/Plan: Continue full code/full scope treatment Patient's son is struggling with severity of patient's illness, open to all aggressive interventions at this time Ongoing GOC discussions pending clinical course Psychosocial and emotional support provided PMT will see again 9/19. Please call team line if urgent needs arise   Prognosis:  Guarded to Poor  Discharge Planning: To Be Determined   Care plan was discussed with RN, patient's son, patient's boyfriend  MDM: High      Daisy [name] Daisy Fell, PA-C  Palliative Medicine Team Team phone # [phone]  Thank you for allowing the Palliative Medicine Team to assist in the  care of this patient. Please utilize secure chat with additional questions, if there is no response within 30 minutes please call the above phone number.  Palliative Medicine Team providers are available by phone from 7am to 7pm daily and can be reached through the team cell phone.  Should this patient require assistance outside of these hours, please call the patient's attending physician.

## 2024-08-10 DIAGNOSIS — D72829 Elevated white blood cell count, unspecified: Secondary | ICD-10-CM | POA: Diagnosis not present

## 2024-08-10 DIAGNOSIS — I609 Nontraumatic subarachnoid hemorrhage, unspecified: Secondary | ICD-10-CM | POA: Diagnosis not present

## 2024-08-10 LAB — BASIC METABOLIC PANEL WITH GFR
Anion gap: 13 (ref 5–15)
BUN: 20 mg/dL (ref 8–23)
CO2: 19 mmol/L — ABNORMAL LOW (ref 22–32)
Calcium: 9.3 mg/dL (ref 8.9–10.3)
Chloride: 108 mmol/L (ref 98–111)
Creatinine, Ser: 0.53 mg/dL (ref 0.44–1.00)
GFR, Estimated: >60 mL/min (ref >60)
Glucose, Bld: 196 mg/dL — ABNORMAL HIGH (ref 70–99)
Potassium: 3.9 mmol/L (ref 3.5–5.1)
Sodium: 140 mmol/L (ref 135–145)

## 2024-08-10 LAB — GLUCOSE, CAPILLARY
Glucose-Capillary: 106 mg/dL — ABNORMAL HIGH (ref 70–99)
Glucose-Capillary: 114 mg/dL — ABNORMAL HIGH (ref 70–99)
Glucose-Capillary: 119 mg/dL — ABNORMAL HIGH (ref 70–99)
Glucose-Capillary: 121 mg/dL — ABNORMAL HIGH (ref 70–99)
Glucose-Capillary: 130 mg/dL — ABNORMAL HIGH (ref 70–99)
Glucose-Capillary: 131 mg/dL — ABNORMAL HIGH (ref 70–99)

## 2024-08-10 LAB — CBC
HCT: 31.4 % — ABNORMAL LOW (ref 36.0–46.0)
Hemoglobin: 10.3 g/dL — ABNORMAL LOW (ref 12.0–15.0)
MCH: 29.9 pg (ref 26.0–34.0)
MCHC: 32.8 g/dL (ref 30.0–36.0)
MCV: 91.3 fL (ref 80.0–100.0)
Platelets: 213 K/uL (ref 150–400)
RBC: 3.44 MIL/uL — ABNORMAL LOW (ref 3.87–5.11)
RDW: 15.9 % — ABNORMAL HIGH (ref 11.5–15.5)
WBC: 15.5 K/uL — ABNORMAL HIGH (ref 4.0–10.5)
nRBC: 0.9 % — ABNORMAL HIGH (ref 0.0–0.2)

## 2024-08-10 MED ORDER — LOPERAMIDE HCL 1 MG/7.5ML PO SUSP
2.0000 mg | Freq: Four times a day (QID) | ORAL | Status: AC
Start: 2024-08-10 — End: 2024-08-11
  Administered 2024-08-10 – 2024-08-11 (×6): 2 mg
  Filled 2024-08-10 (×6): qty 15

## 2024-08-10 NOTE — Progress Notes (Signed)
  NEUROSURGERY PROGRESS NOTE   Pt seen and examined. No issues overnight.  EXAM: Temp:  [98.2 F (36.8 C)-99.4 F (37.4 C)] 98.5 F (36.9 C) (09/16 0745) Pulse Rate:  [83-106] 106 (09/16 0900) Resp:  [14-39] 35 (09/16 0900) BP: (110-170)/(63-109) 161/92 (09/16 0900) SpO2:  [94 %-100 %] 97 % (09/16 0900) Weight:  [60.6 kg] 60.6 kg (09/16 0438) Intake/Output      09/15 0701 09/16 0700 09/16 0701 09/17 0700   I.V. (mL/kg) 558.1 (9.2)    Other 30    NG/GT 3600 370   IV Piggyback 50.1    Total Intake(mL/kg) 4238.2 (69.9) 370 (6.1)   Urine (mL/kg/hr) 2300 (1.6)    Stool 475    Total Output 2775    Net +1463.2 +370        Urine Occurrence 1 x     Eyes open spontaneously Appears to be tracking Occasionally says ouch to central pain Not following commands Localizing LUE, minimal w/d RUE. Minimal w/d BLE  LABS: Lab Results  Component Value Date   CREATININE 0.53 08/10/2024   BUN 20 08/10/2024   NA 140 08/10/2024   K 3.9 08/10/2024   CL 108 08/10/2024   CO2 19 (L) 08/10/2024   Lab Results  Component Value Date   WBC 15.5 (H) 08/10/2024   HGB 10.3 (L) 08/10/2024   HCT 31.4 (L) 08/10/2024   MCV 91.3 08/10/2024   PLT 213 08/10/2024    IMPRESSION: - 68 y.o. female SAH d# 15 s/p coil embolization of Acom aneurysm. Remains obtunded after coiling which did reveal significant ACA spasm. Spot EEG negative. Left ACA territory stroke and multiple smaller embolic strokes on MRI - Vasospasm seen on initial angiogram, TCD with poor sonographic windows, largely unhelpful. Pts clinical exam has been completely unchanged essentially since coiling procedure despite aggressive medical treatment. At this point we can therefore relax blood pressure parameters in an attempt to wean pressors. - TTE reveals possible mitral vegetation, no plan for further eval currently. - HCP resolved.  PLAN: - Can aim for normotension and cont to wean pressors - Cont Nimotop  split Q2, although at this  point if she is very labile can consider d/c - Cont PT/OT - will likely need gastrostomy once off pressors   Gerldine Maizes, MD [facility]

## 2024-08-10 NOTE — Plan of Care (Signed)
  Problem: Education: Goal: Knowledge of disease or condition will improve Outcome: Not Progressing   Problem: Coping: Goal: Will verbalize positive feelings about self Outcome: Not Progressing   

## 2024-08-10 NOTE — Progress Notes (Signed)
 NAME:  Daisy [name], MRN:  [id number], DOB:  1956-10-27, LOS: 16 ADMISSION DATE:  07/25/2024, CONSULTATION DATE:  07/25/2024 REFERRING MD: Darnella - NSGY, CHIEF COMPLAINT:  Sepsis/SAH   History of Present Illness:  68 year old woman who presented to [facility] 8/31 for AMS and not feel well x 1 week. PMHx significant for HTN, COPD (not on home O2), DMT2, vitamin D deficiency, GERD, chronic back pain, EtOH abuse, polysubstance abuse.  Per report, patient was confused and feeling unwell x 1 week and was found down at home. Patient's son called 911. EMS found patient on the ground and drenched in urine with tachycardia and temp 99.72F. Patient was reportedly more confused on day of admission and had been urinating frequently and on herself and having headaches.  She drinks 2-6 beers daily and uses cocaine (last 1 week PTA).  On ED arrival, labs were notable for K 2.6, AG 17, LA 3--->5.9, WBC 17.8. Working diagnosis was sepsis and patient was started on antibiotics.  Due to confusion, CT Head performed demonstrating SAH.  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  CT chest/abd/pelvis did not reveal any acute issues. Transferred to [facility] for further care.  PCCM consulted  Pertinent Medical History:  HTN, COPD (not on home O2), DMT2, Vitamin D deficiency, GERD, chronic back pain, EtOH abuse, polysubstance abuse   Significant Hospital Events: Including procedures, antibiotic start and stop dates in addition to other pertinent events   8/31 - Transferred to [facility] ICU from [facility] acute SAH d/t ruptured Acomm and associated Hydrocephalus 9/1 - Underwent Acomm aneurysm coiling (NSGY) 9/2 Per RN, decreased responsiveness since coiling; prior to intervention was alert and oriented to self, talking/following commands. Spot EEG negative for seizures. Permissive HTN, off of Cleviprex  per NSGY.  Echocardiogram showed  hyperechoic mobile density in the anterior left hip of the mitral valve.  LVEF 60 to 65% normal RV function. Mental status declined on 9/1 with worsening lethargy no longer following commands.  CT was initially negative for change, EEG has been negative for seizure.  BP parameters changed to make goal 180-200 for permissive hypertension on 9/2.  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  Mental status continues to be poor.  Cultures still negative. 9/6: Tracking today.  Saying ouch to painful stimuli. 9/9: stable. Still on levo/vaso for SBP >160 9/10 EVD removed. Wide BP swings. Intermittent fevers. Max 102.5. 9/12 arterial line removed, on intermittent levophed  for nimotop   Interim History / Subjective:  Down to 6 NE only. BP parameters relaxed yesterday 9/15 by NSGY, goal SBP 120 range   Objective:   Blood pressure (!) 149/86, pulse (!) 105, temperature 98.5 F (36.9 C), temperature source Oral, resp. rate (!) 26, height 5' 6 (1.676 m), weight 60.6 kg, SpO2 97%.        Intake/Output Summary (Last 24 hours) at 08/10/2024 0826 Last data filed at 08/10/2024 0742 Gross per 24 hour  Intake 3891.88 ml  Output 2775 ml  Net 1116.88 ml   Filed Weights   08/08/24 0500 08/09/24 0500 08/10/24 0438  Weight: 57.6 kg 59.7 kg 60.6 kg  Physical Examination:  General:Adult female in NAD Lungs: Clear bilateral breath sounds Heart: Slightly tachy, regular, no MRG Abdomen: BS x 4, S/NT/ND Neuro: Eyes open, tracks. Does not follow commands   Resolved Problem List:  Lactic acidosis resolved 9/1 SIRS: off abx.   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  Prior SAH, IVH and ventricular enlargement have improved. MRI done showed large left ACA acute infarct without midline shift and other multiple vascular territory infarct. Repeat head CT 9/10 stable from 9/5 with scattered bilateral infarcts and stable volume of SAH, IVH. EVD removed 9/10. 7 days Keppra  ended 9/9. - Continue nimodipine  half dose for now, can stop if BP falls but currently acceptable - Continue NE, midodrine . Goal SBP > 120 mmHg. - TCD's with poor windows - Neurosurgery following - Maintain normal sodium levels - continue free water  300mL q4hrs.   Fever - resolved Leukocytosis - stable. UA and CXR neg. Suspect fever is neuro related. Procal 9/8 negative. CXR does not demonstrate a pneumonia. EVD was removed 9/10.  - Maintain PICC for now until off pressors, hopefully can d/c soon - antibiotics stopped 9/13, continue to hold  Sinus tachycardia  Suspect this is neuro related. 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  Blood cultures have been negative.  Presented with elevated lactate but now normal. Per NSGY, aneurysm position less likely to be secondary to mycotic aneurysm.  - query multiple infarcts on CT related to possible vegetation  - cardiology deferred TEE until patient has improved    History of COPD  Per family no history of smoking.  However reportedly has COPD. - duonebs prn   History of ETOH use Polysubstance abuse, cocaine  Chronic pain  - Continue thiamine , folate, multivitamin supplementation - Seizure precautions  History of HTN: - home regimen on hold with need for pressors for SBP goal   DMT2:  -  Continue SSI, every 4 hours - CBG goal 140-180  Physical deconditioning: At risk for malnutrition: - PT/OT/SLP - Continue tube feeds via cortrak - protein supplementation, multivitamin, thiamine , folic acid   GOC: palliative care following   CC time: 30 min.   Sammi Gore, PA - C [facility] For pager details, please see AMION or use Epic chat  After 1900, please call [facility] for cross coverage needs 08/10/2024, 8:26 AM

## 2024-08-11 DIAGNOSIS — R6510 Systemic inflammatory response syndrome (SIRS) of non-infectious origin without acute organ dysfunction: Secondary | ICD-10-CM | POA: Diagnosis not present

## 2024-08-11 DIAGNOSIS — F1410 Cocaine abuse, uncomplicated: Secondary | ICD-10-CM

## 2024-08-11 DIAGNOSIS — I607 Nontraumatic subarachnoid hemorrhage from unspecified intracranial artery: Secondary | ICD-10-CM

## 2024-08-11 LAB — CBC
HCT: 31.4 % — ABNORMAL LOW (ref 36.0–46.0)
Hemoglobin: 10.4 g/dL — ABNORMAL LOW (ref 12.0–15.0)
MCH: 29.9 pg (ref 26.0–34.0)
MCHC: 33.1 g/dL (ref 30.0–36.0)
MCV: 90.2 fL (ref 80.0–100.0)
Platelets: 234 K/uL (ref 150–400)
RBC: 3.48 MIL/uL — ABNORMAL LOW (ref 3.87–5.11)
RDW: 15.9 % — ABNORMAL HIGH (ref 11.5–15.5)
WBC: 15.4 K/uL — ABNORMAL HIGH (ref 4.0–10.5)
nRBC: 0.7 % — ABNORMAL HIGH (ref 0.0–0.2)

## 2024-08-11 LAB — BASIC METABOLIC PANEL WITH GFR
Anion gap: 9 (ref 5–15)
BUN: 18 mg/dL (ref 8–23)
CO2: 20 mmol/L — ABNORMAL LOW (ref 22–32)
Calcium: 8.6 mg/dL — ABNORMAL LOW (ref 8.9–10.3)
Chloride: 110 mmol/L (ref 98–111)
Creatinine, Ser: 0.4 mg/dL — ABNORMAL LOW (ref 0.44–1.00)
GFR, Estimated: >60 mL/min (ref >60)
Glucose, Bld: 140 mg/dL — ABNORMAL HIGH (ref 70–99)
Potassium: 3.3 mmol/L — ABNORMAL LOW (ref 3.5–5.1)
Sodium: 139 mmol/L (ref 135–145)

## 2024-08-11 LAB — GLUCOSE, CAPILLARY
Glucose-Capillary: 131 mg/dL — ABNORMAL HIGH (ref 70–99)
Glucose-Capillary: 131 mg/dL — ABNORMAL HIGH (ref 70–99)
Glucose-Capillary: 131 mg/dL — ABNORMAL HIGH (ref 70–99)
Glucose-Capillary: 133 mg/dL — ABNORMAL HIGH (ref 70–99)
Glucose-Capillary: 138 mg/dL — ABNORMAL HIGH (ref 70–99)
Glucose-Capillary: 154 mg/dL — ABNORMAL HIGH (ref 70–99)

## 2024-08-11 LAB — PHOSPHORUS: Phosphorus: 3.9 mg/dL (ref 2.5–4.6)

## 2024-08-11 LAB — MAGNESIUM: Magnesium: 2.2 mg/dL (ref 1.7–2.4)

## 2024-08-11 MED ORDER — POTASSIUM CHLORIDE 20 MEQ PO PACK
40.0000 meq | PACK | Freq: Once | ORAL | Status: AC
  Administered 2024-08-11: 40 meq
  Filled 2024-08-11: qty 2

## 2024-08-11 MED ORDER — MIDODRINE HCL 5 MG PO TABS
10.0000 mg | ORAL_TABLET | Freq: Three times a day (TID) | ORAL | Status: DC
  Administered 2024-08-12 – 2024-08-13 (×6): 10 mg
  Filled 2024-08-11 (×6): qty 2

## 2024-08-11 NOTE — Progress Notes (Signed)
 Speech Language Pathology Treatment: Dysphagia  Patient Details Name: Daisy [name] MRN: [id number] DOB: 1955/12/03 Today's Date: 08/11/2024 Time: 8849-8791 SLP Time Calculation (min) (ACUTE ONLY): 18 min  Assessment / Plan / Recommendation Clinical Impression  Pt maximally alert, attending to right visual field intermittently, though easily distracted to left. When looking at SLP on the right pt does occasionally nod yes to simple social Y/N questions. Appears to open mouth to attempt response. Pt given choices of drinks but could not reach out to point or grasp drink of choice though she did have some spontaneous movement of RUE.  Pt very consistently accepted sips of water , juice and ginger ale from a straw. No difficulty sipping from straw, recognizes it when offered. No oral dysphagia, no signs of pharyngeal dysphagia or aspiration over many trials. Pt also accepted some bites of applesauce, but she grimaced as if she didn't like it. Will initiate a full liquid diet and f/u for tolerance or need for instrumental. Pt now consistently alert and drain out. Expect tolerance, but unsure how much she will accept. Didn't attempt soft solids yet.   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 S/p diagnostic cerebral angiogram, coil embolization of ACA aneurysm 9/1. EEG 9/2 suggestive of moderate to severe diffuse encephalopathy. PMHx: HTN, polysubstance abuse, chronic back pain, T2DM, COPD      SLP Plan  Continue with current plan of care          Recommendations  Diet recommendations: Thin liquid;Dysphagia 1 (puree) Medication Administration: Crushed with puree Supervision: Full supervision/cueing for compensatory strategies Postural Changes and/or Swallow Maneuvers: Seated upright 90 degrees                  Oral care BID           Continue with current plan of care     Kerington Hildebrant, Consuelo Fitch  08/11/2024,  12:40 PM

## 2024-08-11 NOTE — Hospital Course (Signed)
 68-year-old woman who presented to [facility] 8/31 for AMS and not feel well x 1 week. PMHx significant for HTN, COPD (not on home O2), DMT2, vitamin D deficiency, GERD, chronic back pain, EtOH abuse, polysubstance abuse.  Presented to the hospital secondary to [facility] with hydrocephalus.  Neurosurgery was consulted with aneurysm coiling followed by altered mental status development. 8/31 - Transferred to [facility] ICU from [facility] acute SAH d/t ruptured Acomm and associated Hydrocephalus 9/1 - Underwent Acomm aneurysm coiling (NSGY) 9/2 Per RN, decreased responsiveness since coiling; prior to intervention was alert and oriented to self, talking/following commands. Spot EEG negative for seizures. Permissive HTN, off of Cleviprex  per NSGY.  Echocardiogram showed hyperechoic mobile density in the anterior left hip of the mitral valve.  LVEF 60 to 65% normal RV function. Mental status declined on 9/1 with worsening lethargy no longer following commands.  CT was initially negative for change, EEG has been negative for seizure.  BP parameters changed to make goal 180-200 for permissive hypertension on 9/2.  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  Mental status continues to be poor.  Cultures still negative. 9/6: Tracking today.  Saying ouch to painful stimuli. 9/9: stable. Still on levo/vaso for SBP >160 9/10 EVD removed. Wide BP swings. Intermittent fevers. Max 102.5. 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  Prior SAH, IVH and ventricular enlargement have improved. MRI done showed large left ACA acute infarct without midline shift and other multiple vascular territory infarct. Repeat head CT 9/10 stable from 9/5 with scattered bilateral infarcts and stable volume of SAH, IVH. EVD removed 9/10. 7 days Keppra  ended 9/9. - Continue nimodipine  half dose for now, can stop if BP falls but currently acceptable - Continue NE, midodrine . Goal SBP > 120 mmHg. - TCD's with poor windows - Neurosurgery following - Maintain normal sodium levels - continue free water  300mL q4hrs.   Fever - resolved Leukocytosis - stable. UA and CXR neg. Suspect fever is neuro related. Procal 9/8 negative. CXR does not demonstrate a pneumonia. EVD was removed 9/10.  - Maintain PICC for now until off pressors, hopefully can d/c soon - antibiotics stopped 9/13, continue to hold   Sinus tachycardia  Suspect this is neuro related. 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  Blood cultures have been negative.  Presented with elevated lactate but now normal. Per NSGY, aneurysm position less likely to be secondary to mycotic aneurysm.  query multiple infarcts on CT related to possible vegetation  - cardiology deferred TEE until patient has improved    History of COPD  Per family no history of smoking.  However reportedly has COPD.  duonebs prn    History of ETOH use Polysubstance abuse, cocaine  Chronic pain  Continue thiamine , folate, multivitamin supplementation Seizure precautions   History of HTN: home regimen on hold with need  for pressors for SBP goal  Goal SBP less than 160, DBP less than 100.   DMT2:  Continue SSI, every 4 hours - CBG goal 140-180   Physical deconditioning: Moderate protein calorie malnutrition Continue tube feeds via  cortrak

## 2024-08-11 NOTE — TOC Progression Note (Signed)
 Transition of Care [facility]) - Progression Note    Patient Details  Name: Daisy [name] MRN: [id number] Date of Birth: 22-Apr-1956  Transition of Care [facility]) CM/SW Contact  Inocente GORMAN Kindle, LCSW Phone Number: 08/11/2024, 10:10 AM  Clinical Narrative:    CSW continuing to follow. Patient still with Cortrak and Nimotop .    Expected Discharge Plan: Skilled Nursing Facility Barriers to Discharge: Continued Medical Work up, English as a second language teacher, SNF Pending bed offer               Expected Discharge Plan and Services In-house Referral: Clinical Social Work     Living arrangements for the past 2 months: Single Family Home                                       Social Drivers of Health (SDOH) Interventions SDOH Screenings   Tobacco Use: Low Risk  (07/26/2024)    Readmission Risk Interventions     No data to display

## 2024-08-11 NOTE — Progress Notes (Signed)
 [facility] Progress Note Patient: Daisy [name] FMW:[id number] DOB: 29-Mar-1956  DOA: 07/25/2024 DOS: the patient was seen and examined on 08/11/2024  Brief Hospital Course: 68-year-old woman who presented to [facility] 8/31 for AMS and not feel well x 1 week. PMHx significant for HTN, COPD (not on home O2), DMT2, vitamin D deficiency, GERD, chronic back pain, EtOH abuse, polysubstance abuse.  Presented to the hospital secondary to [facility] with hydrocephalus.  Neurosurgery was consulted with aneurysm coiling followed by altered mental status development. 8/31 - Transferred to [facility] ICU from [facility] acute SAH d/t ruptured Acomm and associated Hydrocephalus 9/1 - Underwent Acomm aneurysm coiling (NSGY) 9/2 Per RN, decreased responsiveness since coiling; prior to intervention was alert and oriented to self, talking/following commands. Spot EEG negative for seizures. Permissive HTN, off of Cleviprex  per NSGY.  Echocardiogram showed hyperechoic mobile density in the anterior left hip of the mitral valve.  LVEF 60 to 65% normal RV function. Mental status declined on 9/1 with worsening lethargy no longer following commands.  CT was initially negative for change, EEG has been negative for seizure.  BP parameters changed to make goal 180-200 for permissive hypertension on 9/2.  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  Mental status continues to be poor.  Cultures still negative. 9/6: Tracking today.  Saying ouch to painful stimuli. 9/9: stable. Still on levo/vaso for SBP >160 9/10 EVD removed. Wide BP swings. Intermittent fevers. Max 102.5. 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  Prior SAH, IVH and ventricular enlargement have improved. MRI done showed large left ACA acute infarct without midline shift and other multiple vascular territory infarct. Repeat head CT 9/10 stable from 9/5 with scattered bilateral infarcts and stable volume of SAH, IVH. EVD removed 9/10. 7 days Keppra  ended 9/9. - Continue nimodipine  half dose for now, can stop if BP falls but currently acceptable - Continue NE, midodrine . Goal SBP > 120 mmHg. - TCD's with poor windows - Neurosurgery following - Maintain normal sodium levels - continue free water  300mL q4hrs.   Fever - resolved Leukocytosis - stable. UA and CXR neg. Suspect fever is neuro related. Procal 9/8 negative. CXR does not demonstrate a pneumonia. EVD was removed 9/10.  - Maintain PICC for now until off pressors, hopefully can d/c soon - antibiotics stopped 9/13, continue to hold   Sinus tachycardia  Suspect this is neuro related. 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  Blood cultures have been negative.  Presented with elevated lactate but now normal. Per NSGY, aneurysm position less likely to be secondary to mycotic aneurysm.  query multiple infarcts on CT related to possible vegetation  - cardiology deferred TEE until patient has improved    History of COPD  Per family no history of smoking.  However reportedly has COPD.  duonebs prn    History of  ETOH use Polysubstance abuse, cocaine  Chronic pain  Continue thiamine , folate, multivitamin supplementation Seizure precautions   History of HTN: home regimen on hold with need for pressors for SBP goal  Goal SBP  less than 160, DBP less than 100.   DMT2:  Continue SSI, every 4 hours - CBG goal 140-180   Physical deconditioning: Moderate protein calorie malnutrition Continue tube feeds via cortrak   Subjective: Minimally responsive.  No acute event overnight.  Physical Exam: Clear to auscultation. Lethargic.  Normal to follow commands.  Nonverbal. Bowel sound present.  Data Reviewed: I have Reviewed nursing notes, Vitals, and Lab results. Since last encounter, pertinent lab results CBC and BMP   . I have ordered test including CBC and BMP  .   Disposition: Status is: Inpatient Remains inpatient appropriate because: Monitor for improvement in mentation  enoxaparin  (LOVENOX ) injection 40 mg Start: 07/28/24 2200 SCDs Start: 07/26/24 0026   Family Communication: No one at bedside Level of care: Telemetry Medical   Vitals:   08/11/24 1400 08/11/24 1500 08/11/24 1600 08/11/24 1640  BP: (!) 142/105 (!) 174/95 (!) 152/110 128/88  Pulse: (!) 108 (!) 107 (!) 113 (!) 107  Resp: (!) 36 (!) 38 (!) 43 (!) 24  Temp:   99.4 F (37.4 C)   TempSrc:   Axillary   SpO2: 96% 98% 97% 97%  Weight:      Height:         Author: Yetta Blanch, MD 08/11/2024 6:45 PM  Please look on [email or web address] to find out who is on call.

## 2024-08-11 NOTE — Progress Notes (Signed)
  NEUROSURGERY PROGRESS NOTE   Pt seen and examined. No issues overnight.  EXAM: Temp:  [97.9 F (36.6 C)-99.6 F (37.6 C)] 98.4 F (36.9 C) (09/17 0800) Pulse Rate:  [99-111] 108 (09/17 0900) Resp:  [16-39] 20 (09/17 0800) BP: (112-149)/(79-102) 149/102 (09/17 0900) SpO2:  [95 %-100 %] 96 % (09/17 0900) Weight:  [59.6 kg] 59.6 kg (09/17 0353) Intake/Output      09/16 0701 09/17 0700 09/17 0701 09/18 0700   I.V. (mL/kg) 18.3 (0.3)    Other     NG/GT 1570 100   IV Piggyback     Total Intake(mL/kg) 1588.3 (26.7) 100 (1.7)   Urine (mL/kg/hr) 1900 (1.3) 500 (3.1)   Stool 200    Total Output 2100 500   Net -511.7 -400        Urine Occurrence 1 x     Eyes open spontaneously Appears to be tracking Occasionally says ouch to central pain Not following commands Localizing LUE, minimal w/d RUE. Minimal w/d BLE  LABS: Lab Results  Component Value Date   CREATININE 0.40 (L) 08/11/2024   BUN 18 08/11/2024   NA 139 08/11/2024   K 3.3 (L) 08/11/2024   CL 110 08/11/2024   CO2 20 (L) 08/11/2024   Lab Results  Component Value Date   WBC 15.4 (H) 08/11/2024   HGB 10.4 (L) 08/11/2024   HCT 31.4 (L) 08/11/2024   MCV 90.2 08/11/2024   PLT 234 08/11/2024    IMPRESSION: - 68 y.o. female SAH d# 16 s/p coil embolization of Acom aneurysm. Remains non-communicative after coiling which did reveal significant ACA spasm. Spot EEG negative. Left ACA territory stroke and multiple smaller embolic strokes on MRI - TTE reveals possible mitral vegetation, no plan for further eval currently. - HCP resolved.  PLAN: - Cont Nimotop  split Q2 - Cont PT/OT - will likely need IR for gastrostomy tube - Stable for transfer to stepdown   Gerldine Maizes, MD [facility]

## 2024-08-11 NOTE — Plan of Care (Signed)
  Problem: Health Behavior/Discharge Planning: Goal: Goals will be collaboratively established with patient/family Outcome: Progressing   Problem: Education: Goal: Knowledge of disease or condition will improve Outcome: Not Progressing   Problem: Coping: Goal: Will verbalize positive feelings about self Outcome: Not Progressing

## 2024-08-12 DIAGNOSIS — R0682 Tachypnea, not elsewhere classified: Secondary | ICD-10-CM

## 2024-08-12 DIAGNOSIS — R6510 Systemic inflammatory response syndrome (SIRS) of non-infectious origin without acute organ dysfunction: Secondary | ICD-10-CM | POA: Diagnosis not present

## 2024-08-12 DIAGNOSIS — F1910 Other psychoactive substance abuse, uncomplicated: Secondary | ICD-10-CM

## 2024-08-12 DIAGNOSIS — R000 Tachycardia, unspecified: Secondary | ICD-10-CM

## 2024-08-12 DIAGNOSIS — G8929 Other chronic pain: Secondary | ICD-10-CM

## 2024-08-12 LAB — CBC WITH DIFFERENTIAL/PLATELET
Abs Immature Granulocytes: 0.92 K/uL — ABNORMAL HIGH (ref 0.00–0.07)
Basophils Absolute: 0.1 K/uL (ref 0.0–0.1)
Basophils Relative: 1 %
Eosinophils Absolute: 0.2 K/uL (ref 0.0–0.5)
Eosinophils Relative: 1 %
HCT: 31 % — ABNORMAL LOW (ref 36.0–46.0)
Hemoglobin: 10.2 g/dL — ABNORMAL LOW (ref 12.0–15.0)
Immature Granulocytes: 7 %
Lymphocytes Relative: 18 %
Lymphs Abs: 2.5 K/uL (ref 0.7–4.0)
MCH: 30.4 pg (ref 26.0–34.0)
MCHC: 32.9 g/dL (ref 30.0–36.0)
MCV: 92.3 fL (ref 80.0–100.0)
Monocytes Absolute: 0.7 K/uL (ref 0.1–1.0)
Monocytes Relative: 5 %
Neutro Abs: 9.3 K/uL — ABNORMAL HIGH (ref 1.7–7.7)
Neutrophils Relative %: 68 %
Platelets: 208 K/uL (ref 150–400)
RBC: 3.36 MIL/uL — ABNORMAL LOW (ref 3.87–5.11)
RDW: 16.1 % — ABNORMAL HIGH (ref 11.5–15.5)
WBC: 13.7 K/uL — ABNORMAL HIGH (ref 4.0–10.5)
nRBC: 3.7 % — ABNORMAL HIGH (ref 0.0–0.2)

## 2024-08-12 LAB — BASIC METABOLIC PANEL WITH GFR
Anion gap: 13 (ref 5–15)
BUN: 27 mg/dL — ABNORMAL HIGH (ref 8–23)
CO2: 24 mmol/L (ref 22–32)
Calcium: 9.7 mg/dL (ref 8.9–10.3)
Chloride: 109 mmol/L (ref 98–111)
Creatinine, Ser: 0.56 mg/dL (ref 0.44–1.00)
GFR, Estimated: >60 mL/min (ref >60)
Glucose, Bld: 143 mg/dL — ABNORMAL HIGH (ref 70–99)
Potassium: 4.6 mmol/L (ref 3.5–5.1)
Sodium: 146 mmol/L — ABNORMAL HIGH (ref 135–145)

## 2024-08-12 LAB — MAGNESIUM: Magnesium: 2.5 mg/dL — ABNORMAL HIGH (ref 1.7–2.4)

## 2024-08-12 LAB — GLUCOSE, CAPILLARY
Glucose-Capillary: 130 mg/dL — ABNORMAL HIGH (ref 70–99)
Glucose-Capillary: 124 mg/dL — ABNORMAL HIGH (ref 70–99)
Glucose-Capillary: 160 mg/dL — ABNORMAL HIGH (ref 70–99)
Glucose-Capillary: 166 mg/dL — ABNORMAL HIGH (ref 70–99)
Glucose-Capillary: 120 mg/dL — ABNORMAL HIGH (ref 70–99)

## 2024-08-12 LAB — COMPREHENSIVE METABOLIC PANEL WITH GFR
ALT: 28 U/L (ref 0–44)
AST: 38 U/L (ref 15–41)
Albumin: 3 g/dL — ABNORMAL LOW (ref 3.5–5.0)
Alkaline Phosphatase: 111 U/L (ref 38–126)
Anion gap: 16 — ABNORMAL HIGH (ref 5–15)
BUN: 23 mg/dL (ref 8–23)
CO2: 21 mmol/L — ABNORMAL LOW (ref 22–32)
Calcium: 9.6 mg/dL (ref 8.9–10.3)
Chloride: 108 mmol/L (ref 98–111)
Creatinine, Ser: 0.53 mg/dL (ref 0.44–1.00)
GFR, Estimated: >60 mL/min (ref >60)
Glucose, Bld: 143 mg/dL — ABNORMAL HIGH (ref 70–99)
Potassium: 4.3 mmol/L (ref 3.5–5.1)
Sodium: 145 mmol/L (ref 135–145)
Total Bilirubin: 0.4 mg/dL (ref 0.0–1.2)
Total Protein: 7.4 g/dL (ref 6.5–8.1)

## 2024-08-12 LAB — PHOSPHORUS: Phosphorus: 5 mg/dL — ABNORMAL HIGH (ref 2.5–4.6)

## 2024-08-12 LAB — LACTIC ACID, PLASMA: Lactic Acid, Venous: 1 mmol/L (ref 0.5–1.9)

## 2024-08-12 MED ORDER — LORAZEPAM 1 MG PO TABS
0.5000 mg | ORAL_TABLET | ORAL | Status: DC | PRN
  Administered 2024-08-12 – 2024-08-13 (×2): 0.5 mg
  Filled 2024-08-12 (×2): qty 1

## 2024-08-12 MED ORDER — MORPHINE SULFATE (PF) 2 MG/ML IV SOLN
2.0000 mg | Freq: Once | INTRAVENOUS | Status: AC
  Administered 2024-08-12: 2 mg via INTRAVENOUS
  Filled 2024-08-12: qty 1

## 2024-08-12 NOTE — Progress Notes (Signed)
 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 S/p diagnostic cerebral angiogram, coil embolization of ACA aneurysm 9/1. EEG 9/2 suggestive of moderate to severe diffuse encephalopathy. PMHx: HTN, polysubstance abuse, chronic back pain, T2DM, COPD.  L Resting Hand splint added 9/12.    PT Comments  PT focus of session on seated balance, standing trials. Pt more awake/alert with periods of anxiety this date. Especially once EOB, pt developing tachypnea up to 50 breaths/min with other VSS and pt with anxious presentation at time of tachypnea. Pt requiring max-total +2 assist for bed mobility and repeated transfers into standing. Plan remains appropriate.   SBP 130s during session, RR with periods of tachypnea up to 50 breaths/min     If plan is discharge home, recommend the following: Two people to help with walking and/or transfers;Two people to help with bathing/dressing/bathroom   Can travel by private Environmental consultant;Hospital bed    Recommendations for Other Services       Precautions / Restrictions Precautions Precautions: Fall Recall of Precautions/Restrictions: Impaired Precaution/Restrictions Comments: SBP now >120; on pressors Other Brace: L resting hand splint (doffed during session as worn x4 hours) Restrictions Weight Bearing Restrictions Per Provider Order: No     Mobility  Bed Mobility Overal bed mobility: Needs Assistance Bed Mobility: Supine to Sit, Sit to Supine     Supine to sit: Max assist Sit to supine: Max assist   General bed mobility comments: assist for trunk and LE management.    Transfers Overall transfer level: Needs assistance Equipment used: 2 person hand held assist Transfers: Sit to/from Stand Sit to  Stand: Total assist, +2 physical assistance           General transfer comment: total +2 for power up, rise, steady, and hip clearance. Stand x2 EOB with max LE blocking bilat with significant buckling    Ambulation/Gait                   Stairs             Wheelchair Mobility     Tilt Bed    Modified Rankin (Stroke Patients Only) Modified Rankin (Stroke Patients Only) Pre-Morbid Rankin Score: No symptoms Modified Rankin: Severe disability     Balance Overall balance assessment: Needs assistance Sitting-balance support: No upper extremity supported, Feet unsupported Sitting balance-Leahy Scale: Poor Sitting balance - Comments: min-mod posterior truncal support Postural control: Posterior lean Standing balance support: Bilateral upper extremity supported, During functional activity Standing balance-Leahy Scale: Zero Standing balance comment: total +2                            Communication Communication Communication: Impaired Factors Affecting Communication: Difficulty expressing self  Cognition Arousal: Alert (once EOB) Behavior During Therapy: Restless, Anxious   PT - Cognitive impairments: Difficult to assess                       PT - Cognition Comments: no command following, reflexive grasp LUE and no grasp RUE. Periods of grimacing/anxiety with tachypnea Following commands: Impaired Following commands impaired:  (no command following)    Cueing Cueing Techniques: Gestural cues, Verbal cues, Tactile cues, Visual cues  Exercises  General Comments General comments (skin integrity, edema, etc.): SBP 130s during session, RR with periods of tachypnea up to 50 breaths/min      Pertinent Vitals/Pain Pain Assessment Pain Assessment: Faces Faces Pain Scale: Hurts little more Pain Location: grimacing with mobility, and with what appears to be anxiety Pain Descriptors / Indicators: Moaning, Grimacing Pain  Intervention(s): Limited activity within patient's tolerance, Monitored during session, Repositioned    Home Living                          Prior Function            PT Goals (current goals can now be found in the care plan section) Acute Rehab PT Goals Patient Stated Goal: pt unable to state PT Goal Formulation: With patient/family Time For Goal Achievement: 08/23/24 Potential to Achieve Goals: Poor Progress towards PT goals: Progressing toward goals    Frequency    Min 1X/week      PT Plan      Co-evaluation              AM-PAC PT 6 Clicks Mobility   Outcome Measure  Help needed turning from your back to your side while in a flat bed without using bedrails?: Total Help needed moving from lying on your back to sitting on the side of a flat bed without using bedrails?: Total Help needed moving to and from a bed to a chair (including a wheelchair)?: Total Help needed standing up from a chair using your arms (e.g., wheelchair or bedside chair)?: Total Help needed to walk in hospital room?: Total Help needed climbing 3-5 steps with a railing? : Total 6 Click Score: 6    End of Session Equipment Utilized During Treatment: Gait belt Activity Tolerance: Patient limited by fatigue;Patient tolerated treatment well Patient left: in bed;with call bell/phone within reach;with bed alarm set;with nursing/sitter in room Nurse Communication: Mobility status PT Visit Diagnosis: Other abnormalities of gait and mobility (R26.89);Muscle weakness (generalized) (M62.81)     Time: 8964-8941 PT Time Calculation (min) (ACUTE ONLY): 23 min  Charges:    $Therapeutic Activity: 8-22 mins $Neuromuscular Re-education: 8-22 mins PT General Charges $$ ACUTE PT VISIT: 1 Visit                     Johana RAMAN, PT DPT Acute Rehabilitation Services Secure Chat Preferred  Office [phone]    Hayslee Casebolt E Stroup 08/12/2024, 1:28 PM

## 2024-08-12 NOTE — Progress Notes (Signed)
 Nutrition Follow-up  DOCUMENTATION CODES:   Non-severe (moderate) malnutrition in context of social or environmental circumstances  INTERVENTION:  Continue Tube feeding via Cortrak tube: Osmolite 1.5 @50  ml/hr (1200 ml per day) Prosource TF20 60 ml BID Nutrisource Fiber 1 packet QID    Provides 1960 kcal, 115 gm protein, 912 ml free water  daily   Continue MVI with minerals, folic acid  and thiamine    Full liquid diet, advancement per SLP  NUTRITION DIAGNOSIS:   Moderate Malnutrition related to social / environmental circumstances as evidenced by moderate fat depletion, severe muscle depletion. - Ongoing   GOAL:   Patient will meet greater than or equal to 90% of their needs - Progressing   MONITOR:   TF tolerance, Labs  REASON FOR ASSESSMENT:   Consult Enteral/tube feeding initiation and management  ASSESSMENT:   Pt with PMH of COPD (no home O2), DM, Vitamin D deficiency, GERD, chronic back pain, ETOH abuse, and polysubstance abuse (cocaine). Pt was not feeling well x 1 week PTA and found down at home by son, admitted with [facility] secondary to ruptured aneurysm.  9/1 - s/p EVD placement and coiling  9/2 - s/p cortrak placement; tip distal stomach per xray 9/3 Echo showed possible signs of vegetation  9/10 - EVD removed  9/14 - fiber supplement added QID  9/17 - diet started by SLP    Patient seen in room, staff at bedside. Patient sleeping and difficult to arouse. Staff reports patient just finished getting a bath and has only been taking a few sips of liquids per meal. Tube feeds at goal rate via cortrak. Off pressors. Room air.   Temp (24hrs), Avg:99.1 F (37.3 C), Min:98.1 F (36.7 C), Max:99.4 F (37.4 C) MAP (cuff):  Admit weight: 67 kg - ? Accuracy   Current weight: 59.7 kg  Weight Information (since admission)     Date/Time Weight Weight in lbs BSA (Calculated - sq m) BMI (Calculated) Who   08/12/24 0500 59 kg 130.07 lbs -- 21 KU   08/11/24 0353  59.6 kg 131.39 lbs -- 21.22 KU   08/10/24 0438 60.6 kg 133.6 lbs -- 21.57 ES   08/09/24 0500 59.7 kg 131.61 lbs -- 21.25 ES   08/08/24 0500 57.6 kg 126.99 lbs -- 20.51 RP   08/07/24 0600 57.8 kg 127.43 lbs -- 20.58 KU   08/06/24 0545 59.7 kg 131.61 lbs -- 21.25 KU   08/05/24 0500 59.9 kg 132.06 lbs -- 21.32 JM   08/04/24 0515 59.3 kg 130.73 lbs -- 21.11 SM   08/03/24 0500 59 kg 130.07 lbs -- 21 BS   07/29/24 0704 69.6 kg 153.44 lbs -- 24.78 KE   07/28/24 0500 69.7 kg 153.66 lbs -- 24.81 JL   07/27/24 0500 69.7 kg 153.66 lbs -- 24.81 JL   07/26/24 0500 67 kg 147.71 lbs -- 23.85 JL   07/25/24 1553 67 kg 147.71 lbs 1.77 sq meters 23.85 CK     Intake/Output Summary (Last 24 hours) at 08/12/2024 1108 Last data filed at 08/12/2024 0700 Gross per 24 hour  Intake 1250 ml  Output 1675 ml  Net -425 ml   Net IO Since Admission: 11,555.57 mL [08/12/24 1108]  Drains/Lines: FMS: 300 ml x 24 hours   Average Meal Intake: Minimal, only taking small sips of liquids   Nutritionally Relevant Medications: Scheduled Meds:   feeding supplement (PROSource TF20)  60 mL Per Tube BID   fiber  1 packet Per Tube QID   folic acid   1 mg Per Tube Daily   insulin  aspart  0-9 Units Subcutaneous Q4H   multivitamin with minerals  1 tablet Per Tube Daily   pantoprazole  (PROTONIX ) IV  40 mg Intravenous QHS   thiamine   100 mg Per Tube Daily    Continuous Infusions:  feeding supplement (OSMOLITE 1.5 CAL) 50 mL/hr at 08/12/24 0700   Labs Reviewed: Glu 143  Phos 5.0  Mg 2.5  CBG ranges from 124-138 mg/dL over the last 24 hours HgbA1c 6.3  Diet Order:   Diet Order             Diet full liquid Room service appropriate? No; Fluid consistency: Thin  Diet effective now                   EDUCATION NEEDS:   Not appropriate for education at this time  Skin:  Skin Assessment: Skin Integrity Issues: Skin Integrity Issues:: Other (Comment) Other: Wound on buttocks  Last BM:  9/15 via FMS type  7  Height:   Ht Readings from Last 1 Encounters:  07/25/24 5' 6 (1.676 m)    Weight:   Wt Readings from Last 1 Encounters:  08/12/24 59 kg    Ideal Body Weight:  59.1 kg  BMI:  Body mass index is 20.99 kg/m.  Estimated Nutritional Needs:   Kcal:  1800-2000  Protein:  110-120 grams  Fluid:  >1.8 L/day   Madalyn Potters, MS, RD, LDN Clinical Dietitian  Please see AMiON for contact information.

## 2024-08-12 NOTE — Progress Notes (Signed)
 A Andrez made aware that patient with intermittent episodes of tachypnea (30-40) during shift. PRN oxycodone  given when due and one time dose of morphine  was given. Please see MAR. During episodes, patient oxygen saturations remain 96-99 and breath sounds unchanged.

## 2024-08-12 NOTE — Progress Notes (Signed)
 Speech Language Pathology Treatment: Dysphagia;Cognitive-Linguistic  Patient Details Name: ALEISHA PAONE MRN: [id number] DOB: [birth year] Today's Date: 08/12/2024 Time: 9044-8986 SLP Time Calculation (min) (ACUTE ONLY): 18 min  Assessment / Plan / Recommendation Clinical Impression  Pt fully alert, tracking SLP in room, occasionally making facial expressions conveying meaning - eyebrows up, grimacing, slight head nod yes. Pt does still have intermittent episodes of effortful breathing and increased rate. But these also calm down with interaction - wiping lips, offering small sips of juice or ice cream. Pt able to orally control boluses and swallow without loss of control due to respiratory pattern. She does seem comforted by sips and bites. Intake is minimal. Would need improved endurance for significant oral intake. Continue full liquids.    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 S/p diagnostic cerebral angiogram, coil embolization of ACA aneurysm 9/1. EEG 9/2 suggestive of moderate to severe diffuse encephalopathy. PMHx: HTN, polysubstance abuse, chronic back pain, T2DM, COPD      SLP Plan  Continue with current plan of care          Recommendations  Diet recommendations: Thin liquid Liquids provided via: Straw Medication Administration: Crushed with puree Supervision: Full supervision/cueing for compensatory strategies Compensations: Slow rate;Small sips/bites Postural Changes and/or Swallow Maneuvers: Seated upright 90 degrees                  Oral care BID           Continue with current plan of care     Janera Peugh, Consuelo Fitch  08/12/2024, 10:33 AM

## 2024-08-12 NOTE — Progress Notes (Signed)
 [facility] Progress Note Patient: Daisy [name] FMW:[id number] DOB: 1956-11-14  DOA: 07/25/2024 DOS: the patient was seen and examined on 08/12/2024  Brief Hospital Course: 68-year-old woman who presented to [facility] 8/31 for AMS and not feel well x 1 week. PMHx significant for HTN, COPD (not on home O2), DMT2, vitamin D deficiency, GERD, chronic back pain, EtOH abuse, polysubstance abuse.  Presented to the hospital secondary to [facility] with hydrocephalus.  Neurosurgery was consulted with aneurysm coiling followed by altered mental status development. 8/31 - Transferred to [facility] ICU from [facility] acute SAH d/t ruptured Acomm and associated Hydrocephalus 9/1 - Underwent Acomm aneurysm coiling (NSGY) 9/2 Per RN, decreased responsiveness since coiling; prior to intervention was alert and oriented to self, talking/following commands. Spot EEG negative for seizures. Permissive HTN, off of Cleviprex  per NSGY.  Echocardiogram showed hyperechoic mobile density in the anterior left hip of the mitral valve.  LVEF 60 to 65% normal RV function. Mental status declined on 9/1 with worsening lethargy no longer following commands.  CT was initially negative for change, EEG has been negative for seizure.  BP parameters changed to make goal 180-200 for permissive hypertension on 9/2.  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  Mental status continues to be poor.  Cultures still negative. 9/6: Tracking today.  Saying ouch to painful stimuli. 9/9: stable. Still on levo/vaso for SBP >160 9/10 EVD removed. Wide BP swings. Intermittent fevers. Max 102.5. 9/12 arterial line removed, on intermittent levophed  for  nimotop   Assessment and Plan: SAH due to ruptured ACOM aneurysm status post coiling, associated hydrocephalus status post EVD Acute encephalopathy due to [facility] CTA 9/2: Showing left acute infarct which is new along with vasospasm in ACA, R MCA, left PCA territory.  Prior SAH, IVH and ventricular enlargement have improved. MRI done showed large left ACA acute infarct without midline shift and other multiple vascular territory infarct. Repeat head CT 9/10 stable from 9/5 with scattered bilateral infarcts and stable volume of SAH, IVH. EVD removed 9/10. 7 days Keppra  ended 9/9. - Continue nimodipine  half dose for now,  - midodrine . Goal SBP > 120 mmHg. - TCD's with poor windows - Neurosurgery following - Maintain normal sodium levels    Fever - resolved Leukocytosis - stable. UA and CXR neg. Suspect fever is neuro related. Procal 9/8 negative. CXR does not demonstrate a pneumonia. EVD was removed 9/10.  PICC line discontinued.   Sinus tachycardia  Suspect this is neuro related. 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  Blood cultures have been negative.  Presented with elevated lactate but now normal. Per NSGY, aneurysm position less likely to be secondary to mycotic aneurysm.  query multiple infarcts on CT related to possible vegetation  - cardiology deferred TEE until patient has improved    History of COPD  Per family no history of smoking.  However reportedly has COPD.  duonebs prn    History of ETOH use Polysubstance abuse, cocaine  Chronic pain  Continue thiamine , folate, multivitamin supplementation Seizure precautions   History of HTN: home regimen on hold with need for pressors for  SBP goal  Goal SBP less than 160, DBP less than 100.   DMT2:  Continue SSI, every 4 hours - CBG goal 140-180   Physical deconditioning: Moderate protein calorie malnutrition Continue tube  feeds via cortrak  Intermittent tachypnea concerning for anxiety. Ativan  as needed.   Subjective: Intermittent tachycardia reported by the staff with facial grimacing as well as panic look. No nausea no vomiting no fever no chills.  Physical Exam: Clear to auscultation. S1-S2 present. Bowel sound present. No edema. Unable to follow any commands.  Nonverbal.  Data Reviewed: I have Reviewed nursing notes, Vitals, and Lab results. Since last encounter, pertinent lab results CBC and BMP   . I have ordered test including CBC and BMP  .   Disposition: Status is: Inpatient Remains inpatient appropriate because: Monitor for improvement in mentation  enoxaparin  (LOVENOX ) injection 40 mg Start: 07/28/24 2200 SCDs Start: 07/26/24 0026   Family Communication: No one at bedside Level of care: Telemetry Medical continue Vitals:   08/12/24 1549 08/12/24 1600 08/12/24 1700 08/12/24 1800  BP:  (!) 161/111 (!) 170/110 (!) 140/88  Pulse:  (!) 115 (!) 113 (!) 104  Resp:  (!) 21 (!) 41 (!) 28  Temp: 98.9 F (37.2 C)     TempSrc: Axillary     SpO2:  95% 98% 92%  Weight:      Height:         Author: Yetta Blanch, MD 08/12/2024 7:32 PM  Please look on [email or web address] to find out who is on call.

## 2024-08-13 ENCOUNTER — Encounter (HOSPITAL_COMMUNITY)

## 2024-08-13 ENCOUNTER — Inpatient Hospital Stay (HOSPITAL_COMMUNITY)

## 2024-08-13 ENCOUNTER — Other Ambulatory Visit (HOSPITAL_COMMUNITY): Payer: Self-pay

## 2024-08-13 ENCOUNTER — Telehealth (HOSPITAL_COMMUNITY): Payer: Self-pay | Admitting: Pharmacy Technician

## 2024-08-13 DIAGNOSIS — Z7189 Other specified counseling: Secondary | ICD-10-CM | POA: Diagnosis not present

## 2024-08-13 DIAGNOSIS — Z515 Encounter for palliative care: Secondary | ICD-10-CM | POA: Diagnosis not present

## 2024-08-13 DIAGNOSIS — R6510 Systemic inflammatory response syndrome (SIRS) of non-infectious origin without acute organ dysfunction: Secondary | ICD-10-CM | POA: Diagnosis not present

## 2024-08-13 DIAGNOSIS — I609 Nontraumatic subarachnoid hemorrhage, unspecified: Secondary | ICD-10-CM | POA: Diagnosis not present

## 2024-08-13 LAB — CBC WITH DIFFERENTIAL/PLATELET
Basophils Absolute: 0 K/uL (ref 0.0–0.1)
Basophils Relative: 0 %
Eosinophils Absolute: 0 K/uL (ref 0.0–0.5)
Eosinophils Relative: 0 %
HCT: 32.6 % — ABNORMAL LOW (ref 36.0–46.0)
Hemoglobin: 10.8 g/dL — ABNORMAL LOW (ref 12.0–15.0)
Lymphocytes Relative: 11 %
Lymphs Abs: 1.7 K/uL (ref 0.7–4.0)
MCH: 30.2 pg (ref 26.0–34.0)
MCHC: 33.1 g/dL (ref 30.0–36.0)
MCV: 91.1 fL (ref 80.0–100.0)
Monocytes Absolute: 0.3 K/uL (ref 0.1–1.0)
Monocytes Relative: 2 %
Neutro Abs: 13.5 K/uL — ABNORMAL HIGH (ref 1.7–7.7)
Neutrophils Relative %: 87 %
Platelets: 226 K/uL (ref 150–400)
RBC: 3.58 MIL/uL — ABNORMAL LOW (ref 3.87–5.11)
RDW: 16.6 % — ABNORMAL HIGH (ref 11.5–15.5)
WBC: 15.5 K/uL — ABNORMAL HIGH (ref 4.0–10.5)
nRBC: 9.8 % — ABNORMAL HIGH (ref 0.0–0.2)

## 2024-08-13 LAB — BASIC METABOLIC PANEL WITH GFR
Anion gap: 12 (ref 5–15)
Anion gap: 14 (ref 5–15)
Anion gap: 14 (ref 5–15)
BUN: 31 mg/dL — ABNORMAL HIGH (ref 8–23)
BUN: 31 mg/dL — ABNORMAL HIGH (ref 8–23)
BUN: 37 mg/dL — ABNORMAL HIGH (ref 8–23)
CO2: 19 mmol/L — ABNORMAL LOW (ref 22–32)
CO2: 20 mmol/L — ABNORMAL LOW (ref 22–32)
CO2: 18 mmol/L — ABNORMAL LOW (ref 22–32)
Calcium: 9.6 mg/dL (ref 8.9–10.3)
Calcium: 9.7 mg/dL (ref 8.9–10.3)
Calcium: 9.9 mg/dL (ref 8.9–10.3)
Chloride: 118 mmol/L — ABNORMAL HIGH (ref 98–111)
Chloride: 116 mmol/L — ABNORMAL HIGH (ref 98–111)
Chloride: 114 mmol/L — ABNORMAL HIGH (ref 98–111)
Creatinine, Ser: 0.84 mg/dL (ref 0.44–1.00)
Creatinine, Ser: 0.71 mg/dL (ref 0.44–1.00)
Creatinine, Ser: 1.06 mg/dL — ABNORMAL HIGH (ref 0.44–1.00)
GFR, Estimated: >60 mL/min (ref >60)
GFR, Estimated: >60 mL/min (ref >60)
GFR, Estimated: 58 mL/min — ABNORMAL LOW (ref >60)
Glucose, Bld: 186 mg/dL — ABNORMAL HIGH (ref 70–99)
Glucose, Bld: 160 mg/dL — ABNORMAL HIGH (ref 70–99)
Glucose, Bld: 287 mg/dL — ABNORMAL HIGH (ref 70–99)
Potassium: 4.3 mmol/L (ref 3.5–5.1)
Potassium: 4.1 mmol/L (ref 3.5–5.1)
Potassium: 4.5 mmol/L (ref 3.5–5.1)
Sodium: 149 mmol/L — ABNORMAL HIGH (ref 135–145)
Sodium: 150 mmol/L — ABNORMAL HIGH (ref 135–145)
Sodium: 146 mmol/L — ABNORMAL HIGH (ref 135–145)

## 2024-08-13 LAB — BLOOD GAS, ARTERIAL
Acid-base deficit: 7 mmol/L — ABNORMAL HIGH (ref 0.0–2.0)
Bicarbonate: 15.3 mmol/L — ABNORMAL LOW (ref 20.0–28.0)
O2 Saturation: 97.2 %
Patient temperature: 33.6
pCO2 arterial: 20 mmHg — ABNORMAL LOW (ref 32–48)
pH, Arterial: 7.48 — ABNORMAL HIGH (ref 7.35–7.45)
pO2, Arterial: 61 mmHg — ABNORMAL LOW (ref 83–108)

## 2024-08-13 LAB — COMPREHENSIVE METABOLIC PANEL WITH GFR
ALT: 32 U/L (ref 0–44)
AST: 47 U/L — ABNORMAL HIGH (ref 15–41)
Albumin: 3.1 g/dL — ABNORMAL LOW (ref 3.5–5.0)
Alkaline Phosphatase: 116 U/L (ref 38–126)
Anion gap: 20 — ABNORMAL HIGH (ref 5–15)
BUN: 35 mg/dL — ABNORMAL HIGH (ref 8–23)
CO2: 18 mmol/L — ABNORMAL LOW (ref 22–32)
Calcium: 9.8 mg/dL (ref 8.9–10.3)
Chloride: 114 mmol/L — ABNORMAL HIGH (ref 98–111)
Creatinine, Ser: 0.73 mg/dL (ref 0.44–1.00)
GFR, Estimated: >60 mL/min (ref >60)
Glucose, Bld: 184 mg/dL — ABNORMAL HIGH (ref 70–99)
Potassium: 4.4 mmol/L (ref 3.5–5.1)
Sodium: 152 mmol/L — ABNORMAL HIGH (ref 135–145)
Total Bilirubin: 0.6 mg/dL (ref 0.0–1.2)
Total Protein: 8 g/dL (ref 6.5–8.1)

## 2024-08-13 LAB — GLUCOSE, CAPILLARY
Glucose-Capillary: 100 mg/dL — ABNORMAL HIGH (ref 70–99)
Glucose-Capillary: 149 mg/dL — ABNORMAL HIGH (ref 70–99)
Glucose-Capillary: 159 mg/dL — ABNORMAL HIGH (ref 70–99)
Glucose-Capillary: 160 mg/dL — ABNORMAL HIGH (ref 70–99)
Glucose-Capillary: 164 mg/dL — ABNORMAL HIGH (ref 70–99)
Glucose-Capillary: 173 mg/dL — ABNORMAL HIGH (ref 70–99)
Glucose-Capillary: 250 mg/dL — ABNORMAL HIGH (ref 70–99)

## 2024-08-13 LAB — MAGNESIUM: Magnesium: 2.8 mg/dL — ABNORMAL HIGH (ref 1.7–2.4)

## 2024-08-13 MED ORDER — FREE WATER
200.0000 mL | Status: DC
  Administered 2024-08-13 (×5): 200 mL

## 2024-08-13 MED ORDER — MIDODRINE HCL 5 MG PO TABS: 5.0000 mg | ORAL_TABLET | Freq: Three times a day (TID) | ORAL | Status: DC

## 2024-08-13 MED ORDER — NIMODIPINE 6 MG/ML PO SOLN
60.0000 mg | ORAL | Status: DC
  Administered 2024-08-13 (×2): 60 mg
  Filled 2024-08-13 (×5): qty 10

## 2024-08-13 MED ORDER — DEXTROSE 5 % IV SOLN: INTRAVENOUS | Status: DC

## 2024-08-13 MED ORDER — SODIUM CHLORIDE 0.45 % IV BOLUS
500.0000 mL | Freq: Once | INTRAVENOUS | Status: AC
  Administered 2024-08-13: 500 mL via INTRAVENOUS

## 2024-08-13 MED ORDER — NIMODIPINE 30 MG PO CAPS
60.0000 mg | ORAL_CAPSULE | ORAL | Status: DC
  Filled 2024-08-13 (×5): qty 2

## 2024-08-13 NOTE — TOC Progression Note (Signed)
 Transition of Care [facility]) - Progression Note    Patient Details  Name: Daisy [name] MRN: [id number] Date of Birth: 03-Jun-1956  Transition of Care [facility]) CM/SW Contact  Inocente GORMAN Kindle, LCSW Phone Number: 08/13/2024, 10:20 AM  Clinical Narrative:    CSW received consult for possible SNF placement at time of discharge. CSW spoke with patient's son. He reported that patient's spouse and son are currently unable to care for patient at their home given patient's current physical needs and fall risk. Patient's son expressed understanding of PT recommendation and is agreeable to SNF placement at time of discharge. CSW explained that placement will likely be out of [facility]. Son reported not liking that but understands the barriers. He prefers Educational psychologist over [place] or [place] if possible. CSW discussed insurance authorization process and will provide Medicare SNF ratings list. CSW will send out referrals for review and provide bed offers as available once cortrak is removed. CSW asked if patient had Medicare but spouse (in the background) reported that patient still worked so did not qualify for [employer].    Skilled Nursing Rehab Facilities-   [email or web address]   Ratings out of 5 stars (5 the highest)  Name Address  Phone # Quality Care Staffing Health Inspection Overall  [facility] [address] [phone] 2 1 2 1   [facility] [address] [place] [phone] 5 2 4 5   [facility] [address], [place] [phone] 2 1 1 1   [facility] [address] [place] [phone] 4 3 4 4   [facility]  [address], [place] [phone] 4 3 5 5   [facility] [address], [facility] [phone] 5 3 2 3   [facility] [address], [place] [phone] 5 1 2 2   [facility] [address] [place] [phone] 3 4 4 4   [address] ([place]) [address] [place] [phone] 1 3 3 2   [facility] [address] [place], [place] [phone] 4 2 2 2   [facility] [address], [place] [phone] 2 1 1 1   Clotilda Pereyra [address] [place] [phone] 2 4 4 4   [facility] [address] [place] [phone] 3 1 3 2   [facility]) [address], [place] [phone] 1 2 4 3           [facility] [address] [place] [phone] 3 1 5 4   [facility] [address] [place] [phone] 4 2 1 1   [facility]  [address] [place] [phone] 2 4 1 1   [facility] [address] [phone] 2 2 5 5   [facility] [address], [place] [phone] 2 2 3 3           [facility] [address] [place] [phone] 2 1 2 1   [facility] (No [facility]) [address], [place] [phone] 4 3 4 4   [facility] [address], [facility] [phone] 3  5 5   [facility] [address], [place] [phone] 4 2 1 1   [facility] [address] [place] [phone] 3 1 2 1   [facility] [address] [place] [phone] 1 3 3 2   [facility] [address] [place] [phone] 2 2 3 3   [facility] [address] [place], [place] [phone] 2 1 4 3   [facility] [address], [employer]  [phone] 2 1 1 1   [facility] [address] [place] [phone] 2 1 2 1   [facility] [address] [place] [phone] 3 1 2 1           [facility] [address] [place] [phone] 4 1 3 2   [place] [address] [place]  [phone] 2 4 4 4   [facility] (No [employer]) [address], [place] [phone] 3 2 5 5   [facility] [address], [place] [phone] 3 2 3 3   [facility] [address] [place] [phone]  5 3 5 5   [facility] [address], [place] [phone] 2 1 1 1   [facility] [address] [place], [place] [phone] 3 5 5 5           [facility] [address] [place], [place] [phone] 5 4 5 5   [facility])  [address], [place] [phone] 1 1 2 1   [facility]) [address], [place] [phone]  2 4 4   [facility] [address] [place] [phone] 3 5 5 5   [address] [place], [place] [phone] 4 2 2 2   [facility]) [address] [place] [phone] 1 1 3 1   [address] [place] [phone] 2 2 2 2       Expected Discharge Plan: Skilled Nursing Facility Barriers to Discharge: Continued Medical Work up, English as a second language teacher, SNF Pending bed offer               Expected Discharge Plan and Services In-house Referral: Clinical Social Work   Post Acute Care Choice: Skilled Nursing Facility Living arrangements for the past 2 months: Single Family Home                                       Social Drivers of Health (SDOH) Interventions SDOH Screenings   Tobacco Use: Low Risk  (07/26/2024)    Readmission Risk Interventions     No data to display

## 2024-08-13 NOTE — Progress Notes (Signed)
    Providing Compassionate, Quality Care - Together   NEUROSURGERY PROGRESS NOTE     S: No issues overnight.    O: EXAM:  BP (!) 163/97   Pulse (!) 113   Temp 100.1 F (37.8 C) (Axillary)   Resp (!) 33   Ht 5' 6 (1.676 m)   Wt 60.1 kg   SpO2 100%   BMI 21.39 kg/m     Eyes open spontaneously Appears to be tracking Grimaces to central pain Not following commands Localizing LUE, minimal w/d RUE. Minimal w/d BLE   ASSESSMENT:  68 y.o. female SAH d# 18 s/p coil embolization of Acom aneurysm. Remains non-communicative after coiling which did reveal significant ACA spasm. Spot EEG negative. Left ACA territory stroke and multiple smaller embolic strokes on MRI - TTE reveals possible mitral vegetation, no plan for further eval currently. - HCP resolved.     PLAN: -Continue Nimotop  Q2H -Continue therapies -Call w/ questions/concerns.   Camie Pickle, [facility]

## 2024-08-13 NOTE — Progress Notes (Addendum)
 [facility] Progress Note Patient: Daisy [name] FMW:[id number] DOB: [birth year]  DOA: 07/25/2024 DOS: the patient was seen and examined on 08/13/2024  Brief Hospital Course: 68-year-old woman who presented to [facility] 8/31 for AMS and not feel well x 1 week. PMHx significant for HTN, COPD (not on home O2), DMT2, vitamin D deficiency, GERD, chronic back pain, EtOH abuse, polysubstance abuse.  Presented to the hospital secondary to [facility] with hydrocephalus.  Neurosurgery was consulted with aneurysm coiling followed by altered mental status development. 8/31 - [facility] acute [facility]H d/t ruptured Acomm and associated Hydrocephalus 9/1 - Underwent Acomm aneurysm coiling (NSGY) 9/2 Per RN, decreased responsiveness since coiling; prior to intervention was alert and oriented to self, talking/following commands. Spot EEG negative for seizures. Permissive HTN, off of Cleviprex  per NSGY.  Echocardiogram showed hyperechoic mobile density in the anterior left hip of the mitral valve.  LVEF 60 to 65% normal RV function. Mental status declined on 9/1 with worsening lethargy no longer following commands.  CT was initially negative for change, EEG has been negative for seizure.  BP parameters changed to make goal 180-200 for permissive hypertension on 9/2.  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  Mental status continues to be poor.  Cultures still negative. 9/6: Tracking today.  Saying ouch to painful stimuli. 9/9: stable. Still on levo/vaso for SBP >160 9/10 EVD removed. Wide BP swings. Intermittent fevers. Max 102.5. 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  Prior SAH, IVH and ventricular enlargement have improved. MRI done showed large left ACA acute infarct without midline shift and other multiple vascular territory infarct. Repeat head CT 9/10 stable from 9/5 with scattered bilateral infarcts and stable volume of SAH, IVH. EVD removed 9/10. 7 days Keppra  ended 9/9. - Continue nimodipine  half dose for now,  - midodrine . Goal SBP > 120 mmHg. - TCD's with poor windows - Neurosurgery following - Maintain normal sodium levels    Fever - resolved Leukocytosis - stable. UA and CXR neg. Suspect fever is neuro related. Procal 9/8 negative. CXR does not demonstrate a pneumonia. EVD was removed 9/10.  PICC line discontinued.   Sinus tachycardia  Suspect this is neuro related. 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  Blood cultures have been negative.  Presented with elevated lactate but now normal. Per NSGY, aneurysm position less likely to be secondary to mycotic aneurysm.  query multiple infarcts on CT related to possible vegetation  - cardiology deferred TEE until patient has improved    History of COPD  Per family no history of smoking.  However reportedly has COPD.  duonebs prn    History of ETOH use Polysubstance abuse, cocaine  Chronic pain  Continue thiamine , folate, multivitamin supplementation Seizure precautions   History of HTN: home regimen on hold with need for pressors for  SBP goal  Goal SBP less than 160, DBP less than 100.   DMT2:  Continue SSI, every 4 hours   Physical deconditioning: Moderate protein calorie malnutrition Continue tube feeds via  cortrak  Intermittent tachypnea concerning for anxiety. Ativan  as needed.  Hypernatremia. Sodium level significantly elevated. Will provide IV hydration and monitor. Free water  also ordered..   Left upper extremity swelling. Will order Doppler to rule out a DVT of the upper extremity. PICC line is already removed.  Subjective: Improvement in mentation.  Patient able to follow verbal cues and track.  No nausea or vomiting no other events overnight.  Physical Exam: Clear to auscultation. S1-S2 present. Bowel sound present.  Soft. No edema. Able to track, able to follow verbal cues although not able to follow commands.  Data Reviewed: I have Reviewed nursing notes, Vitals, and Lab results. Since last encounter, pertinent lab results CBC and BMP   . I have ordered test including CBC and BMP  .   Disposition: Status is: Inpatient Remains inpatient appropriate because: Monitor for improvement in potassium and mentation  enoxaparin  (LOVENOX ) injection 40 mg Start: 07/28/24 2200 SCDs Start: 07/26/24 0026   Family Communication: No one at bedside Level of care: Telemetry Medical   Vitals:   08/13/24 1202 08/13/24 1230 08/13/24 1500 08/13/24 1600  BP:  (!) 154/110  (!) 147/105  Pulse:  (!) 117    Resp:  19  (!) 26  Temp: 99.1 F (37.3 C)  98.9 F (37.2 C)   TempSrc: Axillary  Axillary   SpO2:  100%  100%  Weight:      Height:         Author: Yetta Blanch, MD 08/13/2024 6:35 PM  Please look on [email or web address] to find out who is on call.

## 2024-08-13 NOTE — Progress Notes (Signed)
  NEUROSURGERY PROGRESS NOTE   Pt seen and examined. No issues overnight.  EXAM: Temp:  [98.8 F (37.1 C)-100.6 F (38.1 C)] 100.1 F (37.8 C) (09/19 0759) Pulse Rate:  [104-118] 113 (09/19 0355) Resp:  [19-55] 33 (09/19 0745) BP: (119-170)/(88-113) 163/97 (09/19 0745) SpO2:  [90 %-100 %] 100 % (09/19 0745) Weight:  [60.1 kg] 60.1 kg (09/19 0500) Intake/Output      09/18 0701 09/19 0700 09/19 0701 09/20 0700   Other 975    NG/GT 950 200   Total Intake(mL/kg) 1925 (32) 200 (3.3)   Urine (mL/kg/hr) 800 (0.6)    Stool     Total Output 800    Net +1125 +200         Eyes open spontaneously Appears to be tracking Occasionally says ouch to central pain Not following commands Localizing LUE, minimal w/d RUE. Minimal w/d BLE  LABS: Lab Results  Component Value Date   CREATININE 0.73 08/13/2024   BUN 35 (H) 08/13/2024   NA 152 (H) 08/13/2024   K 4.4 08/13/2024   CL 114 (H) 08/13/2024   CO2 18 (L) 08/13/2024   Lab Results  Component Value Date   WBC 15.5 (H) 08/13/2024   HGB 10.8 (L) 08/13/2024   HCT 32.6 (L) 08/13/2024   MCV 91.1 08/13/2024   PLT 226 08/13/2024    IMPRESSION: - 68 y.o. female SAH d# 17 s/p coil embolization of Acom aneurysm. Remains non-communicative after coiling which did reveal significant ACA spasm. Spot EEG negative. Left ACA territory stroke and multiple smaller embolic strokes on MRI - TTE reveals possible mitral vegetation, no plan for further eval currently. - HCP resolved.  PLAN: - Cont Nimotop  split Q2 to finish 21d - Cont PT/OT - will likely need IR for gastrostomy tube - Stable for transfer to stepdown   Gerldine Maizes, MD [facility]

## 2024-08-13 NOTE — Progress Notes (Addendum)
 Daily Progress Note   Patient Name: Daisy [name]       Date: 08/13/2024 DOB: [birth year]  Age: 68 y.o. MRN#: [id number] Attending Physician: Tobie Yetta HERO, MD Primary Care Physician: Darra Hamilton, PA-C Admit Date: 07/25/2024  Reason for Consultation/Follow-up: Establishing goals of care  Subjective: Medical records reviewed including progress notes, labs and imaging. Patient assessed at the bedside. She did not look when I called her name. Discussed with RN. No visitors were present.  Called patient's son Jermaine for ongoing GOC discussions and palliative support. Created space and opportunity for his thoughts and feelings on patient's current illness. He remains hopeful and wants to stay positive. He is coordinating several visitors, including from out of state, to come lift her spirits.   We discussed her diet progression and the possibility of difficult medical decision-making in the near future, such as interventions including PEG, depending on how she does. I inquired about his stance on other aggressive measures including CODE STATUS.  He does not want to commit to anything yet and really hopes she can get better without needing anything too aggressive. He is agreeable to discussing goals of care further in the coming days, based on what she needs as she continues to progress towards SNF placement. Recommended outpatient palliative care follow up and he is agreeable.  Questions and concerns addressed. PMT will continue to support holistically.   Length of Stay: 19   Physical Exam Vitals and nursing note reviewed.  Constitutional:      General: She is not in acute distress.    Appearance: She is ill-appearing.  HENT:     Head: Normocephalic and atraumatic.  Cardiovascular:     Rate and Rhythm: Tachycardia present.  Pulmonary:      Effort: Tachypnea present.  Skin:    General: Skin is warm and dry.  Neurological:     Mental Status: She is alert.  Psychiatric:        Speech: She is noncommunicative.             Vital Signs: BP (!) 163/97   Pulse (!) 113   Temp 100.1 F (37.8 C) (Axillary)   Resp (!) 33   Ht 5' 6 (1.676 m)   Wt 60.1 kg   SpO2 100%   BMI 21.39 kg/m  SpO2: SpO2: 100 % O2 Device: O2 Device: Room Air O2 Flow Rate: O2 Flow Rate (L/min): 2 L/min      Palliative Assessment/Data: 30% (on tube feeds)   Palliative Care Assessment & Plan   Patient Profile: 68 y.o. female  with past medical history of HTN, COPD (not on home O2), DMT2, vitamin D deficiency, GERD, chronic back pain, EtOH abuse, polysubstance abuse admitted on 07/25/2024 with altered mental status after being found down by son.    Patient was initially admitted with a working diagnosis of sepsis and found to have SAH on CT head, aneurysm on CTA as likely source of SAH.  She had coiling on 9/1 with subsequent decline in mental status.  Repeat CTA then showed vasospasm, new large acute left anterior cerebral territory infarct, as well as small acute infarct in the PCA territory on the left on 9/2.  Echo on 9/3 showed concerns for vegetation, though unable to get TEE until patient improves further.  She is on and off pressors as needed with intermittent fevers.   PMT has been consulted to assist with goals of care conversation.  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  Recommendations/Plan: Continue full code/full scope treatment  Patient's son hopes for improvement and understands potential for poor QOL/complications Ongoing GOC discussions pending clinical course Patient's son agreeable to outpatient palliative care referral, TOC assistance is appreciated Psychosocial and emotional support provided PMT  will follow peripherally and re-engage as needed   Prognosis:  Guarded to Poor  Discharge Planning: Skilled Nursing Facility for rehab with Palliative care service follow-up   Care plan was discussed with RN, patient's son    Haskel Dewalt SHAUNNA Fell, PA-C  Palliative Medicine Team Team phone # [phone]  Thank you for allowing the Palliative Medicine Team to assist in the care of this patient. Please utilize secure chat with additional questions, if there is no response within 30 minutes please call the above phone number.  Palliative Medicine Team providers are available by phone from 7am to 7pm daily and can be reached through the team cell phone.  Should this patient require assistance outside of these hours, please call the patient's attending physician.     Time Total: 35  Visit consisted of counseling and education dealing with the complex and emotionally intense issues of symptom management and palliative care in the setting of serious and potentially life-threatening illness. Greater than 50% of this time was spent counseling and coordinating care related to the above assessment and plan.  Personally spent 35 minutes in patient care including extensive chart review (labs, imaging, progress/consult notes, vital signs), medically appropraite exam, discussed with treatment team, education to patient, family, and staff, documenting clinical information, medication review and management, coordination of care, and available advanced directive documents.

## 2024-08-13 NOTE — Progress Notes (Signed)
 Patient's left arm is edematous, patient had prior left PICC line that was removed yesterday 08/12/2024.   Patient's arm elevated on pillows and MD Patel notified of swelling in left arm.

## 2024-08-13 NOTE — Progress Notes (Addendum)
 Received call from lab regarding glucose result of 992 on 1100 basic metabolic panel.   Patient has a 5% dextose infusion running in right PIV and right hand is where labs were drawn by phlebotomy.   Labs were drawn below infusion but RN questioned if infusion affected lab result causing an inaccuracy.   Lab department reordering basic metabolic panel and RN pausing 5% dextrose  infusion at this time.     Questioned lab tech regarding 1104 BMP glucose result showing in results review as it is showing as 186, lab tech advised this was lab result from this morning and is not the current lab result as she did not release those results.      RN to await lab to redraw BMP as well as capillary blood glucose check by nurse tech.   1159 capillary blood glucose level 159

## 2024-08-13 NOTE — Telephone Encounter (Signed)
 Pharmacy Patient Advocate Encounter  Insurance verification completed.    The patient is insured through [employer].     Ran test claim for niMODipine  30 MG capsule and the current 30 day cash price is $47.53.  Insurance requires prior authorization due to step therapy.   This test claim was processed through [facility]- copay amounts may vary at other pharmacies due to pharmacy/plan contracts, or as the patient moves through the different stages of their insurance plan.

## 2024-08-13 NOTE — Significant Event (Addendum)
 Rapid Response Event Note   Reason for Call :  Tachypnea  Per notes, pt has intermittent episodes of tachypnea. These are thought to be d/t anxiety. Oxy IR 10mg  and Ativan  0.5mg  given per tube at 1959.  Initial Focused Assessment:  Pt lying in bed with eyes closed. She is tachypneic. She does not speak(this is baseline) or withdraw from pain. Pupils are 4, equal, and brisk. Lungs are clear t/o. Skin warm/dry.   T-97.5, HR-108, BP-160/91, RR-56, SpO2-100% on RA.  Interventions:  PCXR-1. No acute cardiopulmonary process. 2. Enteric tube with distal tip below the diaphragm. ABG-7.48/20/61/15.3 Plan of Care:  Pt with known h/o tachypneic episodes. Lungs clear, SpO2-100% on RA. PCXR WNL. Allow oxy/ativan  additional time to work. Continue to monitor pt closely. Please call RRT if further assistance needed.  Event Summary:   MD Notified: Dr. Charlton notified and came to bedside Call Time:2035 Arrival Time:2045 End Upfz:[id number]  Tish Graeme Piety, RN

## 2024-08-14 ENCOUNTER — Inpatient Hospital Stay (HOSPITAL_COMMUNITY): Admitting: Certified Registered Nurse Anesthetist

## 2024-08-14 DIAGNOSIS — I609 Nontraumatic subarachnoid hemorrhage, unspecified: Secondary | ICD-10-CM | POA: Diagnosis not present

## 2024-08-14 DIAGNOSIS — I469 Cardiac arrest, cause unspecified: Secondary | ICD-10-CM

## 2024-08-14 LAB — GLUCOSE, CAPILLARY: Glucose-Capillary: 66 mg/dL — ABNORMAL LOW (ref 70–99)

## 2024-08-14 MED ORDER — ACETAMINOPHEN 325 MG PO TABS: 650.0000 mg | ORAL_TABLET | Freq: Four times a day (QID) | ORAL | Status: DC | PRN

## 2024-08-14 MED ORDER — GLYCOPYRROLATE 1 MG PO TABS: 1.0000 mg | ORAL_TABLET | ORAL | Status: DC | PRN

## 2024-08-14 MED ORDER — HYDROMORPHONE HCL-NACL 50-0.9 MG/50ML-% IV SOLN
0.0000 mg/h | INTRAVENOUS | Status: DC
  Filled 2024-08-14: qty 50

## 2024-08-14 MED ORDER — GLYCOPYRROLATE 0.2 MG/ML IJ SOLN: 0.2000 mg | INTRAMUSCULAR | Status: DC | PRN

## 2024-08-14 MED ORDER — ACETAMINOPHEN 650 MG RE SUPP: 650.0000 mg | Freq: Four times a day (QID) | RECTAL | Status: DC | PRN

## 2024-08-14 MED ORDER — SODIUM CHLORIDE 0.9 % IV SOLN: INTRAVENOUS | Status: DC

## 2024-08-14 MED ORDER — HYDROMORPHONE BOLUS VIA INFUSION: 1.0000 mg | INTRAVENOUS | Status: DC | PRN

## 2024-08-14 MED ORDER — POLYVINYL ALCOHOL 1.4 % OP SOLN: 1.0000 [drp] | Freq: Four times a day (QID) | OPHTHALMIC | Status: DC | PRN

## 2024-08-25 NOTE — Progress Notes (Signed)
 Rapid response called due to tachypnea

## 2024-08-25 NOTE — Anesthesia Procedure Notes (Signed)
 Procedure Name: Intubation Date/Time: 08/27/24 12:22 AM  Performed by: Claudene Florina Boga, CRNAPre-anesthesia Checklist: Patient identified, Emergency Drugs available, Suction available and Patient being monitored Patient Re-evaluated:Patient Re-evaluated prior to induction Oxygen Delivery Method: Ambu bag Preoxygenation: Pre-oxygenation with 100% oxygen Ventilation: Mask ventilation without difficulty Laryngoscope Size: Glidescope and 3 Grade View: Grade I Tube type: Oral Tube size: 7.5 mm Number of attempts: 1 Airway Equipment and Method: Rigid stylet and Video-laryngoscopy Placement Confirmation: ETT inserted through vocal cords under direct vision, positive ETCO2 and breath sounds checked- equal and bilateral Secured at: 21 cm Tube secured with: Tape Dental Injury: Teeth and Oropharynx as per pre-operative assessment  Comments: Elective glidescope intubation during active code blue.

## 2024-08-25 NOTE — Death Summary Note (Addendum)
 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 PMHx significant for HTN, COPD (not on home O2), DMT2, vitamin D deficiency, GERD, chronic back pain, EtOH abuse, polysubstance abuse.  Was found to have SAH with hydrocephalus.  Neurosurgery was consulted   Patient initially presented to the [facility] long hospital.  There was concern for SIRS on admission.  Code sepsis was called and patient was started on IV antibiotics. Admitting provider ordered a CT scan of the head which showed a subarachnoid hemorrhage.  Neurosurgery was consulted.  Patient was started on Cleviprex  drip.  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  08-16-2024 - Transferred to [facility] ICU from [facility] acute SAH d/t ruptured Acomm and associated Hydrocephalus 9/1 - Underwent Acomm aneurysm coiling (NSGY) developed moderate to severe vasospasm of ACA bilaterally. 9/2 Per RN, decreased responsiveness since coiling; prior to intervention was alert and oriented to self, talking/following commands. Spot EEG negative for seizures. Permissive HTN, off of Cleviprex  per NSGY.  Echocardiogram showed hyperechoic mobile density in the anterior left hip of the mitral valve.   LVEF 60 to 65% normal RV function. Mental status declined with worsening lethargy no longer following commands.  CT was initially negative for change. BP parameters changed to make goal 180-200 for permissive hypertension on 9/2.  CT angiogram obtained which showed vasospasm as well as large acute left anterior cerebral territory infarct which was new as well as small acute infarct in the PCA territory on the left 9/3 echocardiogram revealed possible signs of vegetation on mitral valve leaflet.  TEE was requested although was canceled as the patient was getting tube feeds. 9/4 TEE was canceled and was deferred until patient is close to discharge when more stable.  Mental status continues to be poor.  Cultures still negative. 9/10 EVD removed. Wide BP swings. Intermittent fevers. Max 102.5. 9/12 arterial line removed, on intermittent levophed  for nimotop  9/13.  Palliative care was consulted 9/17 patient had orders for transfer to MedSurg which was changed to telemetry.  SLP saw the patient and recommended full liquid diet. 9/18 PICC line was removed. 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  Prior SAH, IVH and ventricular enlargement have improved. MRI done showed large left ACA acute infarct without midline shift and other multiple vascular territory infarct. Repeat head CT 9/10 stable from 9/5 with scattered bilateral infarcts and stable volume of SAH, IVH. EVD removed 9/10.  7 days Keppra  ended 9/9. Patient was continued  on nimodipine .  This medication dose was adjusted to half dose due to her blood pressure being on the lower side. Patient was treated with midodrine  and vasopressors for  improvement in her blood pressure. On 9/19 patient's blood pressure has been remaining adequately elevated and therefore after discussion with neurosurgery no Modicon dose was adjusted to regular dose every 4 hours. Patient has remained nonverbal but intermittently has been able to track.   Fever - resolved Leukocytosis - stable. UA and CXR neg. Suspect fever is neuro related. Procal 9/8 negative. CXR does not demonstrate a pneumonia. EVD was removed 9/10.  PICC line discontinued. Treated with ceftriaxone  as well as brief course of Zosyn . Blood cultures have remained negative x 2.   Sinus tachycardia  Present throughout the hospital stay. Suspect secondary to her neurological condition.   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  Blood cultures have been negative.  Presented with elevated lactate but now normal. Per NSGY, aneurysm position less likely to be secondary to mycotic aneurysm.  query multiple infarcts on CT related to possible vegetation  cardiology deferred TEE until patient has improved    History of COPD  Per family no history of smoking.  However reportedly has COPD.  No evidence of exacerbation.   History of ETOH use Polysubstance abuse, cocaine  Chronic pain  Continue thiamine , folate, multivitamin supplementation Seizure precautions   History of HTN: Patient was receiving midodrine  for blood pressure being on the lower side.  DMT2:  Treated with subcutaneous insulin .   Physical deconditioning: Moderate protein calorie malnutrition Continue tube feeds via cortrak  Intermittent tachypnea concerning for anxiety. Patient has been intermittently tachypneic during her stay in the ICU. Per RN patient is not hypoxic during this episode and has appearance of distress with grimacing. This episode resolved with use of oxycodone . RN requested addition of Ativan  as needed.  Hypernatremia. Sodium level trended up to 146 on 9/18.   Patient was on free water  which was discontinued in the ICU. On repeat evaluation send level elevated significantly to 152. Patient was started on D5 and free wate with improvement in sodium level. Clinically patient was actually more interactive on 9/19 morning.  Anion gap. Lab work in the morning on 9/19 also showed incidental anion gap although repeat evaluation showed resolution of the anion gap without any major intervention. Anion gap is likely lab error. Lab work similarly on 9/18 showed elevated anion gap. lactic acid level was 1.0. And on repeat lab anion gap resolved without any intervention.  Hypokalemia. Corrected.  Left upper extremity edema. Approximately Doppler was ordered.  PEA arrest. Patient had a rapid response with For shortness of breath and tachypnea.  Patient remained 100% on room air at that time chest x-ray was not showing any acute abnormality.  ABG was performed which showed pH of 7.48 and pCO2 of 20 and pO2 of 61.  Patient was medicated for anxiety. Later on around midnight patient had a cardiac arrest.  Likely PEA arrest.  Received atropine, epinephrine  as well as sodium bicarbonate.  After 24-minute of CPR ROSC was achieved.  Patient was transferred to ICU.  Patient had refractory hypotension with bradycardic events.  PCCM discussed goals of care with the family and recommended to consider comfort measures. Family agreed.  Patient was pronounced deceased at 1:14 AM.  Procedures: EEG Arterial line placement Right frontal ventriculostomy Cerebral angiogram coil embolization of the anterior communicating artery aneurysm Echocardiogram   Consultations:  Neurosurgery Palliative  care  PCCM primary admission. Cardiology  The results of significant diagnostics from this hospitalization (including imaging, microbiology, ancillary and laboratory) are listed below for reference.   Significant Diagnostic Studies: DG CHEST PORT 1 VIEW Result Date:  08/13/2024 EXAM: 1 VIEW XRAY OF THE CHEST 08/13/2024 09:12:00 PM COMPARISON: 08/09/2024 CLINICAL HISTORY: 33497 Acute respiratory distress FINDINGS: LINES, TUBES AND DEVICES: Enteric tube coursing below the diaphragm. LUNGS AND PLEURA: Mild left basilar opacity, favoring atelectasis. No pulmonary edema. No pleural effusion. No pneumothorax. HEART AND MEDIASTINUM: No acute abnormality of the cardiac and mediastinal silhouettes. BONES AND SOFT TISSUES: No acute osseous abnormality. IMPRESSION: 1. No acute cardiopulmonary process. 2. Enteric tube with distal tip below the diaphragm. Electronically signed by: Pinkie Pebbles MD 08/13/2024 09:14 PM EDT RP Workstation: [id number]   DG CHEST PORT 1 VIEW Result Date: 08/09/2024 CLINICAL DATA:  Abnormal respirations EXAM: PORTABLE CHEST 1 VIEW COMPARISON:  08/06/2024 FINDINGS: Cardiac shadow is enlarged but stable. Feeding catheter extends into the stomach. Left PICC is noted in satisfactory position. Lungs are well aerated bilaterally. No focal infiltrate is seen. No acute bony abnormality is noted. IMPRESSION: No acute abnormality noted.  Tubes and lines as described. Electronically Signed   By: Oneil Devonshire M.D.   On: 08/09/2024 01:43   VAS US  TRANSCRANIAL DOPPLER Result Date: 08/06/2024  Transcranial Doppler Patient Name:  MILADY FLEENER  Date of Exam:   08/06/2024 Medical Rec #: [id number]         Accession #:    [id number] Date of Birth: [birth year]         Patient Gender: F Patient Age:   41 years Exam Location:  [facility] Procedure:      VAS US  TRANSCRANIAL DOPPLER Referring Phys: MAUDE BANNER --------------------------------------------------------------------------------  Indications: Subarachnoid hemorrhage. 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 Limitations for diagnostic windows: Unable to insonate right transtemporal window. Unable to insonate left transtemporal window. Comparison Study: Previous exam 08/04/2024 Performing  Technologist: Ezzie Potters RVT, RDMS  Examination Guidelines: A complete evaluation includes B-mode imaging, spectral Doppler, color Doppler, and power Doppler as needed of all accessible portions of each vessel. Bilateral testing is considered an integral part of a complete examination. Limited examinations for reoccurring indications may be performed as noted.  +----------+---------------+----------+-----------+-------+ RIGHT TCD Right VM (cm/s)Depth (cm)PulsatilityComment +----------+---------------+----------+-----------+-------+ Opthalmic       13                    0.97            +----------+---------------+----------+-----------+-------+ ICA siphon      33                    1.07            +----------+---------------+----------+-----------+-------+ Vertebral       -46                   0.75            +----------+---------------+----------+-----------+-------+  +----------+--------------+----------+-----------+-------+ LEFT TCD  Left VM (cm/s)Depth (cm)PulsatilityComment +----------+--------------+----------+-----------+-------+ Opthalmic       13                   1.17            +----------+--------------+----------+-----------+-------+ ICA siphon      29                   0.77            +----------+--------------+----------+-----------+-------+  Vertebral      -25                   0.96            +----------+--------------+----------+-----------+-------+  +------------+-------+-------+             VM cm/sComment +------------+-------+-------+ Prox Basilar  -14   0.70   +------------+-------+-------+ Summary:  Highly suboptimal study due to poor windows throughout. antegrade flow noted in both opthalmics, carotid siphons, vertebrals and basilar arteries without evidence of definite vasospasm.  *See table(s) above for TCD measurements and observations.  Diagnosing physician: Ary Cummins MD Electronically signed by Ary Cummins MD on 08/06/2024 at 6:27:59  PM.    Final    CT HEAD WO CONTRAST ( ) Result Date: 08/06/2024 CLINICAL DATA:  68 year old female with ruptured anterior communicating artery aneurysm, subarachnoid hemorrhage, a EVD, ACA territory infarcts. EXAM: CT HEAD WITHOUT CONTRAST TECHNIQUE: Contiguous axial images were obtained from the base of the skull through the vertex without intravenous contrast. RADIATION DOSE REDUCTION: This exam was performed according to the departmental dose-optimization program which includes automated exposure control, adjustment of the mA and/or kV according to patient size and/or use of iterative reconstruction technique. COMPARISON:  Head CT 08/04/2024 and earlier. FINDINGS: Brain: Right side EVD removed. Trace right frontal horn pneumo ventricle. Stable small volume IVH. Stable ventricle size and configuration. Patchy left greater than right ACA territory infarcts, additional smaller bilateral cerebral infarcts demonstrated by MRI on 07/30/2024. Scattered small volume residual subarachnoid hemorrhage does not appear significantly changed. No midline shift or significant intracranial mass effect. Stable gray-white matter differentiation throughout the brain. No new intracranial hemorrhage identified. Basilar cisterns are patent. Vascular: Anterior communicating artery region embolization coil pack. Calcified atherosclerosis at the skull base. Skull: Stable right superior frontal burr hole.  Otherwise intact. Sinuses/Orbits: Left nasoenteric tube remains in place. Visualized paranasal sinuses and mastoids are stable and well aerated. Other: Rightward gaze persists. Mild postoperative changes to the scalp. IMPRESSION: 1. Right EVD removed with stable ventricle size and configuration. Stable small volume IVH. 2. Left infarcts most confluent in the left ACA territory and small volume residual subarachnoid hemorrhage appear stable. 3. No new intracranial abnormality. Electronically Signed   By: VEAR Hurst M.D.   On: 08/06/2024  10:04   DG CHEST PORT 1 VIEW Result Date: 08/06/2024 CLINICAL DATA:  68 year old female with shortness of breath. SIRS. Ruptured anterior communicating artery aneurysm. EXAM: PORTABLE CHEST 1 VIEW COMPARISON:  Portable chest 08/04/2024 and earlier. FINDINGS: Portable AP semi upright view at 0823 hours. Low lung volumes. Enteric feeding tube courses to the abdomen, tip not included. Mediastinal contours are stable, tortuous descending thoracic aorta. Allowing for portable technique the lungs are clear. No pneumothorax or pleural effusion. Stable visible bowel gas and osseous structures. IMPRESSION: Low lung volumes, otherwise no acute cardiopulmonary abnormality. Electronically Signed   By: VEAR Hurst M.D.   On: 08/06/2024 08:35   VAS US  TRANSCRANIAL DOPPLER Result Date: 08/05/2024  Transcranial Doppler Patient Name:  ALLIEN MELBERG  Date of Exam:   08/04/2024 Medical Rec #: [id number]         Accession #:    [id number] Date of Birth: 08/06/1956         Patient Gender: F Patient Age:   10 years Exam Location:  [facility] Procedure:      VAS US  TRANSCRANIAL DOPPLER Referring Phys: MAUDE BANNER --------------------------------------------------------------------------------  Indications: Subarachnoid hemorrhage. History: Coil embolization of Acom aneurysm on  07/26/2024. Limitations for diagnostic windows: Unable to insonate right transtemporal window. Unable to insonate left transtemporal window. Comparison Study: Previous exam was on 08/02/2024 Performing Technologist: Ezzie Potters RVT, RDMS  Examination Guidelines: A complete evaluation includes B-mode imaging, spectral Doppler, color Doppler, and power Doppler as needed of all accessible portions of each vessel. Bilateral testing is considered an integral part of a complete examination. Limited examinations for reoccurring indications may be performed as noted.  +----------+---------------+----------+-----------+------------------+ RIGHT TCD Right VM  (cm/s)Depth (cm)Pulsatility     Comment       +----------+---------------+----------+-----------+------------------+ MCA                                           unable to insonate +----------+---------------+----------+-----------+------------------+ ACA                                           unable to insonate +----------+---------------+----------+-----------+------------------+ Term ICA                                      unable to insonate +----------+---------------+----------+-----------+------------------+ PCA P1                                        unable to insonate +----------+---------------+----------+-----------+------------------+ Opthalmic       12                    1.06                       +----------+---------------+----------+-----------+------------------+ ICA siphon      19                    1.03                       +----------+---------------+----------+-----------+------------------+ Vertebral       -15                   0.83                       +----------+---------------+----------+-----------+------------------+  +----------+--------------+----------+-----------+------------------+ LEFT TCD  Left VM (cm/s)Depth (cm)Pulsatility     Comment       +----------+--------------+----------+-----------+------------------+ MCA                                          unable to insonate +----------+--------------+----------+-----------+------------------+ ACA                                          unable to insonate +----------+--------------+----------+-----------+------------------+ Term ICA                                     unable to insonate +----------+--------------+----------+-----------+------------------+ PCA P1  unable to insonate +----------+--------------+----------+-----------+------------------+ Opthalmic       13                   1.18                        +----------+--------------+----------+-----------+------------------+ ICA siphon      36                   0.78                       +----------+--------------+----------+-----------+------------------+ Vertebral      -23                   0.95                       +----------+--------------+----------+-----------+------------------+  +------------+-------+------------------+             VM cm/s     Comment       +------------+-------+------------------+ Prox Basilar       unable to insonate +------------+-------+------------------+ Dist Basilar  -24         0.95        +------------+-------+------------------+ Summary:  Highly suboptimal study due to poor windows throughout. antegrade flow note din both opthalmics, carotid siphons, vertebrals and basilar arteries without evidence of definite vasospasm *See table(s) above for TCD measurements and observations.  Diagnosing physician: Eather Popp MD Electronically signed by Eather Popp MD on 08/05/2024 at 10:30:28 AM.    Final    VAS US  TRANSCRANIAL DOPPLER Result Date: 08/05/2024  Transcranial Doppler Patient Name:  AVELINA DELENA REEF  Date of Exam:   08/02/2024 Medical Rec #: [id number]         Accession #:    [id number] Date of Birth: [birth year]         Patient Gender: F Patient Age:   73 years Exam Location:  [facility] Procedure:      VAS US  TRANSCRANIAL DOPPLER Referring Phys: MAUDE BANNER --------------------------------------------------------------------------------  Indications: Subarachnoid hemorrhage. Limitations: Body habitus, patient positioning Limitations for diagnostic windows: Unable to insonate right transtemporal window. Unable to insonate left transtemporal window. Comparison Study: Prior TCD done 07/30/2024 Performing Technologist: Alberta Lis RVS  Examination Guidelines: A complete evaluation includes B-mode imaging, spectral Doppler, color Doppler, and power Doppler as needed of all accessible portions  of each vessel. Bilateral testing is considered an integral part of a complete examination. Limited examinations for reoccurring indications may be performed as noted.  +----------+---------------+----------+-----------+------------------+ RIGHT TCD Right VM (cm/s)Depth (cm)Pulsatility     Comment       +----------+---------------+----------+-----------+------------------+ MCA                                           Unable to insonate +----------+---------------+----------+-----------+------------------+ ACA                                           Unable to insonate +----------+---------------+----------+-----------+------------------+ Term ICA                                      Unable to insonate +----------+---------------+----------+-----------+------------------+ PCA P1  Unable to insonate +----------+---------------+----------+-----------+------------------+ Opthalmic       20                    1.35                       +----------+---------------+----------+-----------+------------------+ ICA siphon                                    Unable to insonate +----------+---------------+----------+-----------+------------------+ Vertebral                                     Unable to insonate +----------+---------------+----------+-----------+------------------+  +----------+--------------+----------+-----------+------------------+ LEFT TCD  Left VM (cm/s)Depth (cm)Pulsatility     Comment       +----------+--------------+----------+-----------+------------------+ MCA                                          Unable to insonate +----------+--------------+----------+-----------+------------------+ ACA                                          Unable to insonate +----------+--------------+----------+-----------+------------------+ Term ICA                                     Unable to insonate  +----------+--------------+----------+-----------+------------------+ PCA P1                                       Unable to insonate +----------+--------------+----------+-----------+------------------+ Opthalmic       19                   1.28                       +----------+--------------+----------+-----------+------------------+ ICA siphon      56                   1.18                       +----------+--------------+----------+-----------+------------------+ Vertebral                                    Unable to insonate +----------+--------------+----------+-----------+------------------+  +------------+-------+------------------+             VM cm/s     Comment       +------------+-------+------------------+ Prox Basilar       Unable to insonate +------------+-------+------------------+ Summary:  Highly limited study due to poor windows throughout. Antegrade flow noted in bilateral opthalmics and left carotid siphon only. *See table(s) above for TCD measurements and observations.  Diagnosing physician: Eather Popp MD Electronically signed by Eather Popp MD on 08/05/2024 at 10:29:08 AM.    Final    CT HEAD WO CONTRAST ( ) Result Date: 08/04/2024 CLINICAL DATA:  68 year old female with ruptured anterior communicating artery aneurysm, subarachnoid hemorrhage, EVD, left ACA territory infarct. EXAM: CT HEAD WITHOUT  CONTRAST TECHNIQUE: Contiguous axial images were obtained from the base of the skull through the vertex without intravenous contrast. RADIATION DOSE REDUCTION: This exam was performed according to the departmental dose-optimization program which includes automated exposure control, adjustment of the mA and/or kV according to patient size and/or use of iterative reconstruction technique. COMPARISON:  Brain MRI 07/30/2024 and earlier. FINDINGS: Brain: Stable right superior frontal approach EVD, terminating in the midline at the 3rd ventricle. Stable ventricle size  and configuration. Small volume IVH now primarily in the right occipital horn is stable. Cytotoxic edema in the left ACA territory, evolved since the CT on 07/27/2024 but appears stable and extent and configuration since that time. Less pronounced patchy and more watershed type contralateral right ACA cytotoxic edema is stable, better demonstrated by MRI (series 6, image 18). And other bilateral scattered small cerebral infarcts on MRI in other vascular territories largely occult by CT. Left occipital pole cytotoxic edema is apparent. No malignant hemorrhagic transformation of infarcts. Small volume residual subarachnoid hemorrhage appears stable since 07/27/2024. No midline shift or significant intracranial mass effect. Basilar cisterns remain patent. Vascular: Calcified atherosclerosis at the skull base. Anterior communicating artery region embolization coil pack with streak artifact is stable. Skull: Stable right superior burr hole. No acute osseous abnormality identified. Sinuses/Orbits: Visualized paranasal sinuses and mastoids are stable and well aerated. Other: Mild postoperative changes at the scalp vertex. Rightward gaze. Left nasoenteric tube 4 in place with no visible adverse features. IMPRESSION: 1. Stable scattered bilateral brain infarcts since 07/30/2024, most confluent and most apparent by CT in the Left ACA territory. No malignant hemorrhagic transformation. No significant intracranial mass effect. 2. 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 Stable right frontal approach EVD and ventricle size. 3. No new intracranial abnormality identified. Electronically Signed   By: VEAR Hurst M.D.   On: 08/04/2024 05:15   DG Chest Port 1 View Result Date: 08/04/2024 EXAM: 1 VIEW XRAY OF THE CHEST 08/04/2024 03:13:00 AM COMPARISON: 08/03/2024 CLINICAL HISTORY: Aspiration into lower respiratory tract FINDINGS: LUNGS AND PLEURA: Low lung volumes. No focal pulmonary opacity. No pulmonary edema. No  pleural effusion. No pneumothorax. HEART AND MEDIASTINUM: No acute abnormality of the cardiac and mediastinal silhouettes. BONES AND SOFT TISSUES: No acute osseous abnormality. Stable left arm PICC. VP shunt catheter IMPRESSION: 1. No acute findings. 2. Low lung volumes. Electronically signed by: Norman Gatlin MD 08/04/2024 03:27 AM EDT RP Workstation: [id number]   DG CHEST PORT 1 VIEW Result Date: 08/03/2024 EXAM: 1 VIEW XRAY OF THE CHEST 08/03/2024 01:29:00 PM COMPARISON: 07/30/2024 CLINICAL HISTORY: Respiratory failure. FINDINGS: LUNGS AND PLEURA: Low lung volumes. No focal pulmonary opacity. No pulmonary edema. No pleural effusion. No pneumothorax. HEART AND MEDIASTINUM: No acute abnormality of the cardiac and mediastinal silhouettes. BONES AND SOFT TISSUES: No acute osseous abnormality. Left arm PICC line in place with tip at the superior cavoatrial junction. Feeding tube in place which courses below the gastroesophageal junction. IMPRESSION: 1. Low lung volumes. 2. No acute findings. Electronically signed by: Waddell Calk MD 08/03/2024 02:02 PM EDT RP Workstation: [id number]   VAS US  TRANSCRANIAL DOPPLER Result Date: 07/31/2024  Transcranial Doppler Patient Name:  TIKIA SKILTON  Date of Exam:   07/30/2024 Medical Rec #: [id number]         Accession #:    [id number] Date of Birth: 1956/04/25         Patient Gender: F Patient Age:   13 years Exam Location:  [facility] Procedure:  VAS US  TRANSCRANIAL DOPPLER Referring Phys: MAUDE BANNER --------------------------------------------------------------------------------  Indications: Subarachnoid hemorrhage. 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 Limitations: Difficult to insonate left transtemporal due to head position. Limitations for diagnostic windows: Unable to insonate right transtemporal window. Unable to insonate left transtemporal window. Performing Technologist: Ricka Sturdivant-Jones RDMS, RVT  Examination Guidelines: A  complete evaluation includes B-mode imaging, spectral Doppler, color Doppler, and power Doppler as needed of all accessible portions of each vessel. Bilateral testing is considered an integral part of a complete examination. Limited examinations for reoccurring indications may be performed as noted.  +----------+---------------+----------+-----------+-------------+ RIGHT TCD Right VM (cm/s)Depth (cm)Pulsatility   Comment    +----------+---------------+----------+-----------+-------------+ MCA                                           not insonated +----------+---------------+----------+-----------+-------------+ ACA                                           not insonated +----------+---------------+----------+-----------+-------------+ Term ICA                                      not insonated +----------+---------------+----------+-----------+-------------+ PCA P1                                        not insonated +----------+---------------+----------+-----------+-------------+ Opthalmic       20                    1.43                  +----------+---------------+----------+-----------+-------------+ ICA siphon                                    not insonated +----------+---------------+----------+-----------+-------------+ Vertebral       -22                   1.21                  +----------+---------------+----------+-----------+-------------+  +----------+--------------+----------+-----------+-------------+ LEFT TCD  Left VM (cm/s)Depth (cm)Pulsatility   Comment    +----------+--------------+----------+-----------+-------------+ MCA                                          not insonated +----------+--------------+----------+-----------+-------------+ ACA                                          not insonated +----------+--------------+----------+-----------+-------------+ Term ICA                                     not insonated  +----------+--------------+----------+-----------+-------------+ PCA P1  not insonated +----------+--------------+----------+-----------+-------------+ Opthalmic       19                   1.33                  +----------+--------------+----------+-----------+-------------+ ICA siphon      42                   1.08                  +----------+--------------+----------+-----------+-------------+ Vertebral      -28                   1.03                  +----------+--------------+----------+-----------+-------------+  +------------+-------+-------+             VM cm/sComment +------------+-------+-------+ Prox Basilar  -22          +------------+-------+-------+ Dist Basilar  -29          +------------+-------+-------+ Summary:  Absent bitemporal windows limit evaluation. antegrade flow in bilateral opthalmics, carotid si[phons, vertebrals and basilar artery without evidence of definite vasospasm noted. Globally elevate dpulsatility indices suggest diffuse increase in intracranial pressure likely *See table(s) above for TCD measurements and observations.  Diagnosing physician: Eather Popp MD Electronically signed by Eather Popp MD on 07/31/2024 at 11:35:32 AM.    Final    MR BRAIN WO CONTRAST Result Date: 07/30/2024 EXAM: MRI BRAIN WITHOUT CONTRAST 07/30/2024 04:54:15 PM TECHNIQUE: Multiplanar multisequence MRI of the head/brain was performed without the administration of intravenous contrast. COMPARISON: CT head 08/06/2024 and CTA head and neck 08/16/2024. CLINICAL HISTORY: Subarachnoid hemorrhage [facility]). FINDINGS: BRAIN AND VENTRICLES: Acute infarct throughout the left ACA (anterior cerebral artery) territory. Additional restricted diffusion in the perisagittal aspect of the right frontal lobe within the right ACA territory. Additional scattered areas of acute infarct more posteriorly within the right ACA territory involving the right  frontoparietal lobes near the vertex. Additional region of acute infarct around the left sylvian fissure within the left MCA (middle cerebral artery) territory. Few additional scattered areas of infarct in the left temporal lobe and left frontal operculum. Punctate foci of acute infarct involving the left basal ganglia. Small foci of infarct along the right sylvian fissure within the right MCA territory. Additional areas of infarct involving the bilateral occipital lobes within the PCA (posterior cerebral artery) territories. Edema throughout the left ACA territory with sulcal effacement and mild local mass effect. Additional mild edema involving the additional regions of infarct as noted above. Right frontal approach ventriculostomy catheter. Mild mass effect on the left frontal horn. No midline shift. Additional small scattered areas of T2/FLAIR hyperintensity likely reflecting chronic microvascular ischemic changes. Susceptibility overlying the bilateral cerebral convexities and along the bilateral sylvian fissures compatible with known subarachnoid hemorrhage. Susceptibility in the bilateral occipital horns compatible with layering intraventricular blood products. 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 ORBITS: No acute abnormality. SINUSES AND MASTOIDS: Fluid within the left aspect of the nasopharynx. Nasoenteric tube noted. Mild mucosal thickening in the left frontal sinus and bilateral ethmoid sinuses. BONES AND SOFT TISSUES: Normal marrow signal. No acute soft tissue abnormality. IMPRESSION: 1. Large acute infarct involving the left ACA territory. Associated edema and sulcal effacement. No midline shift. 2. Mild mass effect on the left frontal horn. 3. Additional acute infarcts as above involving multiple vascular territories. 4. Findings compatible with known subarachnoid hemorrhage and layering intraventricular  blood products. 5. 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 Electronically signed by: Donnice Mania MD 07/30/2024 05:44 PM EDT RP Workstation: [id number]   DG Chest Port 1 View Result Date: 07/30/2024 CLINICAL DATA:  Intracranial hemorrhage, indwelling enteric catheter, sirs EXAM: PORTABLE CHEST 1 VIEW COMPARISON:  07/25/2024 FINDINGS: Single frontal view of the chest demonstrates enteric catheter passing below diaphragm tip excluded by collimation. Left-sided PICC tip overlies atriocaval junction. Cardiac silhouette is stable. No airspace disease, effusion, or pneumothorax. IMPRESSION: 1. Support devices as above. 2. No acute intrathoracic process. Electronically Signed   By: Ozell Daring M.D.   On: 07/30/2024 16:46   VAS US  TRANSCRANIAL DOPPLER Result Date: 07/28/2024  Transcranial Doppler Patient Name:  YESIKA RISPOLI  Date of Exam:   07/28/2024 Medical Rec #: [id number]         Accession #:    [id number] Date of Birth: [birth year]         Patient Gender: F Patient Age:   9 years Exam [facility]:  [facility] Procedure:      VAS US  TRANSCRANIAL DOPPLER Referring Phys: MAUDE BANNER --------------------------------------------------------------------------------  Indications: Subarachnoid hemorrhage. 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 Limitations: patient movement and vocalizations Limitations for diagnostic windows: Unable to insonate right transtemporal window. Unable to insonate left transtemporal window. Comparison Study: Previous exam was on 07/27/2024 Performing Technologist: Ezzie Potters RVT, RDMS  Examination Guidelines: A complete evaluation includes B-mode imaging, spectral Doppler, color Doppler, and power Doppler as needed of all accessible portions of each vessel. Bilateral testing is considered an integral part of a complete examination. Limited examinations for reoccurring indications may be performed as noted.  +----------+---------------+----------+-----------+------------------+ RIGHT TCD Right VM (cm/s)Depth  (cm)Pulsatility     Comment       +----------+---------------+----------+-----------+------------------+ MCA                                           unable to insonate +----------+---------------+----------+-----------+------------------+ ACA                                           unable to insonate +----------+---------------+----------+-----------+------------------+ Term ICA                                      unable to insonate +----------+---------------+----------+-----------+------------------+ PCA P1                                        unable to insonate +----------+---------------+----------+-----------+------------------+ Opthalmic       10                    1.62                       +----------+---------------+----------+-----------+------------------+ ICA siphon      25                    1.18                       +----------+---------------+----------+-----------+------------------+ Vertebral       -19  1.01                       +----------+---------------+----------+-----------+------------------+  +----------+--------------+----------+-----------+------------------+ LEFT TCD  Left VM (cm/s)Depth (cm)Pulsatility     Comment       +----------+--------------+----------+-----------+------------------+ MCA                                          unable to insonate +----------+--------------+----------+-----------+------------------+ ACA                                          unable to insonate +----------+--------------+----------+-----------+------------------+ Term ICA                                     unable to insonate +----------+--------------+----------+-----------+------------------+ PCA P1                                       unable to insonate +----------+--------------+----------+-----------+------------------+ Opthalmic       13                   1.22                        +----------+--------------+----------+-----------+------------------+ ICA siphon      19                   0.83                       +----------+--------------+----------+-----------+------------------+ Vertebral      -16                   0.90                       +----------+--------------+----------+-----------+------------------+  +------------+-------+------------------+             VM cm/s     Comment       +------------+-------+------------------+ Prox Basilar       unable to insonate +------------+-------+------------------+ Dist Basilar       unable to insonate +------------+-------+------------------+ Summary:  Highly limited study due to absent bitemporal and poor orbital; and suboccipital windows.Antegradse flow in both opthalmics, vertebrals, carotid siphons without definite vasospasm noted *See table(s) above for TCD measurements and observations.  Diagnosing physician: Eather Popp MD Electronically signed by Eather Popp MD on 07/28/2024 at 2:19:06 PM.    Final    US  EKG SITE RITE Result Date: 07/28/2024 If Site Rite image not attached, placement could not be confirmed due to current cardiac rhythm.  EEG adult Result Date: 07/28/2024 Shelton Arlin KIDD, MD     07/28/2024 11:14 AM Patient Name: YETZALI WELD MRN: [id number] Epilepsy Attending: Arlin KIDD Shelton Referring Physician/Provider: Ilah Corean HERO, PA-C Date: 07/28/2024 Duration: 22.15 mins Patient history: 68 y.o. 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 EEG to evaluate for seizure.  Level of alertness:  comatose/ lethargic  AEDs during EEG study: LEV  Technical aspects: This EEG study was done with scalp electrodes positioned according to the 10-20 International system of electrode placement.  Electrical activity was reviewed with band pass filter of 1-70Hz , sensitivity of 7 uV/mm, display speed of 6mm/sec with a 60Hz  notched filter applied as appropriate. EEG data were recorded  continuously and digitally stored.  Video monitoring was available and reviewed as appropriate.  Description: EEG showed continuous generalized 3 to 5 Hz theta-delta slowing. Hyperventilation and photic stimulation were not performed.    ABNORMALITY - Continuous slow, generalized  IMPRESSION: This study is suggestive of moderate to severe diffuse encephalopathy. No seizures or epileptiform discharges were seen throughout the recording.  Priyanka O Yadav    VAS US  TRANSCRANIAL DOPPLER Result Date: 07/28/2024  Transcranial Doppler Patient Name:  MYLO DRISKILL  Date of Exam:   07/27/2024 Medical Rec #: [id number]         Accession #:    [id number] Date of Birth: 04-Jun-1956         Patient Gender: F Patient Age:   71 years Exam Location:  [facility] Procedure:      VAS US  TRANSCRANIAL DOPPLER Referring Phys: MAUDE BANNER --------------------------------------------------------------------------------  Indications: Subarachnoid hemorrhage. 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 Limitations for diagnostic windows: Unable to insonate right transtemporal window. Unable to insonate left transtemporal window. Comparison Study: No previous exams Performing Technologist: Jody Hill RVT, RDMS  Examination Guidelines: A complete evaluation includes B-mode imaging, spectral Doppler, color Doppler, and power Doppler as needed of all accessible portions of each vessel. Bilateral testing is considered an integral part of a complete examination. Limited examinations for reoccurring indications may be performed as noted.  +----------+---------------+----------+-----------+------------------+ RIGHT TCD Right VM (cm/s)Depth (cm)Pulsatility     Comment       +----------+---------------+----------+-----------+------------------+ MCA                                           unable to insonate +----------+---------------+----------+-----------+------------------+ ACA                                            unable to insonate +----------+---------------+----------+-----------+------------------+ Term ICA                                      unable to insonate +----------+---------------+----------+-----------+------------------+ PCA P1                                        unable to insonate +----------+---------------+----------+-----------+------------------+ Opthalmic       16                    1.11                       +----------+---------------+----------+-----------+------------------+ ICA siphon                                    unable to insonate +----------+---------------+----------+-----------+------------------+ Vertebral       -23                   1.10                       +----------+---------------+----------+-----------+------------------+  +----------+--------------+----------+-----------+------------------+  LEFT TCD  Left VM (cm/s)Depth (cm)Pulsatility     Comment       +----------+--------------+----------+-----------+------------------+ MCA                                          unable to insonate +----------+--------------+----------+-----------+------------------+ ACA                                          unable to insonate +----------+--------------+----------+-----------+------------------+ Term ICA                                     unable to insonate +----------+--------------+----------+-----------+------------------+ PCA P1                                       unable to insonate +----------+--------------+----------+-----------+------------------+ Opthalmic       10                   1.38                       +----------+--------------+----------+-----------+------------------+ ICA siphon      22                   0.71                       +----------+--------------+----------+-----------+------------------+ Vertebral      -23                   1.01                        +----------+--------------+----------+-----------+------------------+  +------------+-------+-------+             VM cm/sComment +------------+-------+-------+ Prox Basilar  -31   0.98   +------------+-------+-------+ Dist Basilar  -22   0.95   +------------+-------+-------+ Summary:  Highly limited study due to basent bitemporal windows. Antegrade flow in boith opthalmics, vertebrals and basilar without evidence of definite vasospasm noted *See table(s) above for TCD measurements and observations.  Diagnosing physician: Eather Popp MD Electronically signed by Eather Popp MD on 07/28/2024 at 7:57:27 AM.    Final    CT ANGIO HEAD NECK W WO CM Result Date: 07/27/2024 CLINICAL DATA:  Provided history: Stroke, hemorrhagic; subarachnoid hemorrhage. EXAM: CT ANGIOGRAPHY HEAD AND NECK WITH AND WITHOUT CONTRAST TECHNIQUE: Multidetector CT imaging of the head and neck was performed using the standard protocol during bolus administration of intravenous contrast. Multiplanar CT image reconstructions and MIPs were obtained to evaluate the vascular anatomy. Carotid stenosis measurements (when applicable) are obtained utilizing NASCET criteria, using the distal internal carotid diameter as the denominator. RADIATION DOSE REDUCTION: This exam was performed according to the departmental dose-optimization program which includes automated exposure control, adjustment of the mA and/or kV according to patient size and/or use of iterative reconstruction technique. CONTRAST:  75mL OMNIPAQUE  IOHEXOL  350 MG/ML SOLN COMPARISON:  Non-contrast head CT 07/26/2024. CT angiogram head/neck 07/25/2024. FINDINGS: CT HEAD FINDINGS Brain: Unchanged position of a right frontoparietal approach ventricular catheter, terminating within the third ventricle. Mild ventriculomegaly persists, but has decreased since  yesterday's head CT. Persistent although decreased small-volume subarachnoid hemorrhage along the bilateral cerebral convexities.  Persistent small volume hemorrhage within the occipital horn of the right lateral ventricle. Previously demonstrated small-volume hemorrhage within the occipital horn of the left lateral ventricle no longer appreciated. Large acute left ACA territory infarct within the medial frontal lobe, as well as anterior body and genu of corpus callosum. A new small acute infarct is also questioned within the left occipital lobe (PCA vascular territory) (for instance as seen on series 8, image 30). No evidence of an intracranial mass. No midline shift. Vascular: Streak/beam hardening artifact arising from embolization coils at site of a treated anterior communicating artery aneurysm. Skull: Right frontoparietal burr hole with traversing ventricular catheter. Sinuses/Orbits: No orbital mass or acute orbital finding. Mild mucosal thickening within the left frontal sinus. Mild bilateral ethmoid sinusitis. Review of the MIP images confirms the above findings CTA NECK FINDINGS Aortic arch: Standard aortic branching. Minimal atherosclerotic calcification within the aortic arch. Streak/beam hardening artifact arising from a dense contrast bolus partially obscures the right subclavian artery. Within this limitation, there is no appreciable hemodynamically significant innominate or proximal subclavian artery stenosis. Right carotid system: CCA and ICA patent within the neck without stenosis or significant atherosclerotic disease. Left carotid system: CCA and ICA patent within the neck without stenosis or significant atherosclerotic disease. Vertebral arteries: The right vertebral artery is non dominant and developmentally diminutive, but the left vertebral artery is dominant and patent within the neck without stenosis or significant atherosclerotic disease. Patent throughout the neck. Skeleton: The patient is edentulous. Spondylosis at the cervical and visible upper thoracic levels. No acute fracture or aggressive osseous lesion. Other  neck: No neck mass or cervical lymphadenopathy. Upper chest: No consolidation within the imaged lung apices. Other: Partially visualized nasoenteric tube. Review of the MIP images confirms the above findings CTA HEAD FINDINGS Anterior circulation: The intracranial internal carotid arteries are patent. Nonstenotic atherosclerotic plaque within the left ICA cavernous segment. The M1 middle cerebral arteries are patent. No M2 proximal branch occlusion is identified. The anterior cerebral arteries are patent. Progressive vasospasm, most notably affecting the bilateral anterior cerebral arteries, right middle cerebral artery M1 segment, and bilateral M2 and more distal MCA branches. Embolization coils at site of the treated anterior communicating artery aneurysm. Streak/beam hardening artifact arising from the coils limits evaluation for residual enhancement of the aneurysm. Posterior circulation: The intracranial vertebral arteries are patent. The non-dominant right vertebral artery terminates predominantly as the right PICA. The basilar artery is patent. The posterior cerebral arteries are patent. Persistent narrowing of the proximal left PCA, similar to the prior CTA of 07/25/2024 and suspicious for vasospasm. A left posterior communicating artery is present. The right posterior communicating artery is diminutive or absent. Venous sinuses: Contrast timing limits evaluation for dural venous sinus thrombosis. Anatomic variants: As described. Review of the MIP images confirms the above findings CT head impressions #1 and #2, and CTA head impression #1, will be called to the ordering clinician or representative by the Radiologist Assistant, and communication documented in the PACS or [employer]. IMPRESSION: Non-contrast head CT: 1. Large acute left anterior cerebral artery territory infarct, new from yesterday's head CT. 2. A new small acute infarct is also questioned within the left occipital lobe (PCA vascular  territory). Consider a brain MRI for further evaluation. 3. Persistent although decreased small-volume subarachnoid hemorrhage along the bilateral cerebral convexities. 4. Persistent although decreased small-volume intraventricular hemorrhage. 5. Mild ventriculomegaly, slightly improved. CTA neck: 1.  The common carotid, internal carotid and vertebral arteries are patent within the neck without stenosis or significant atherosclerotic disease. 2. Aortic Atherosclerosis (ICD10-I70.0). CTA head: 1. Progressive vasospasm of the anterior circulation, most notably affecting the anterior cerebral arteries, right middle cerebral artery M1 segment and bilateral M2 and more distal MCA branches. Persistent narrowing of the proximal left posterior cerebral artery, similar to the prior CTA of 07/25/2024 and also suspicious for vasospasm. 2. Streak/beam hardening artifact arising from embolization coils limits evaluation for residual enhancement of the treated anterior communicating artery aneurysm. Electronically Signed   By: Rockey Childs D.O.   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 yo female presenting to the hospital with headache and generalized malaise.  Her CT scan revealed diffuse subarachnoid hemorrhage and ventriculomegaly.  Ventricular drain was placed.  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  She therefore presents for diagnostic cerebral angiogram and possible aneurysm coiling.  APPROACH:  The technical aspects of the procedure as well as its potential risks and benefits were reviewed with the patient and her son. These risks included but were not limited bleeding, infection, allergic reaction, damage to organs/vital structures,  stroke, non-diagnostic procedure, and the catastrophic outcomes of heart attack, coma, and death. With an understanding of these risks, informed consent was obtained and witnessed.   The patient was placed in the supine position on the angiography table and the skin of right groin prepped in the usual sterile fashion. The procedure was performed under general anesthesia monitored by the anesthesia service.  Short 5Fr sheath was placed in the right common femoral artery using standard seldinger technique. Position of the sheath was documented with fluoro-phase images.   HEPARIN: 0 Units total.   CONTRAST AGENT: See IR records  FLUOROSCOPY TIME: See IR records   CATHETER(S) AND WIRE(S):   5-French JB-1 glidecatheter  0.035 glidewire  80cm 8Fr NeuronMax guide sheath 125cm Berenstein select catheter 115cm Cat 5 Guide catheter 150cm XT 17 microcatheter Synchro select standard microwire  COILS USED: Target 360 XL 4mm x 8cm Target 360 XL 3mm x 9cm Target 360 XL 2mm x 6cm  VESSELS CATHETERIZED:  Right internal carotid  Left internal carotid  Left vertebral   Left anterior cerebral artery  Right common femoral  VESSELS STUDIED:  Right internal carotid, head Left internal carotid, head Left vertebral  Left internal carotid, during embolization Left internal carotid, post-embolization Left internal carotid, final control  Right femoral  PROCEDURAL NARRATIVE:  A 5-Fr JB-1 terumo glide catheter was advanced over a 0.035 glidewire into the aortic arch. The diagnostic portion of the procedure was completed including catheterization of the above vessels and cervical/cerebral angiograms taken. After review of images, the catheter was removed without incident.  The guide sheath was then introduced over the select catheter and microwire.  Select catheter was then used to select the mid cervical left internal carotid artery. Guide sheath was then advanced over the select catheter into the proximal cervical left internal carotid  artery. Select catheter was then removed.  Under roadmap guidance, the guide catheter was introduced over the 17 microcatheter and microwire.  Microwire and microcatheter were used to select the supraclinoid internal carotid, and the guide catheter was advanced to its final position in the cavernous  segment of the left internal carotid. The aneurysm was then carefully catheterized and the microwire removed.  The above coils were then sequentially deployed with progressive exclusion of the aneurysm.  After coiling, the microcatheter was removed.  Immediate postembolization angiogram was taken.  The guide catheter and sheath were then withdrawn down and final control angiogram was taken from the cervical internal carotid artery.  The guide catheter and sheath were then synchronously removed without incident.  INTERPRETATION:  Right internal carotid, head:  Injection reveals the presence of a patent ICA, M1, and A1 segments and their branches.  There is moderate to severe vasospasm involving the A1 and proximal A2 segment on the left.  There is mild vasospasm involving the proximal M1 segment and the supraclinoid internal carotid artery.  There is an aneurysm projecting towards the right at the A1 A2 junction measuring approximately 5.3 x 3.1 mm.  The aneurysm has a small inferiorly projecting daughter sac at the dome.  There is a relatively narrow neck.  The parenchymal and venous phases are unremarkable. The venous sinuses are widely patent.   Left internal carotid, head:  Injection reveals the presence of a patent ICA, A1, and M1 segments and their branches. No aneurysms, arteriovenous malformations, or high flow fistulas are visualized.  There is however moderate to severe vasospasm involving the right A1 and proximal A2 segments.  There is mild spasm involving the supraclinoid internal carotid artery and the right M1.  The parenchymal and venous phases are unremarkable. The venous sinuses are widely patent.   Left  vertebral:  Injection reveals the presence of a widely patent vertebral artery. This leads to a widely patent basilar artery that terminates in bilateral P1. No aneurysms, arteriovenous malformations, or high flow fistulas are visualized.  There is mild vasospasm involving the distal basilar artery.  The parenchymal and venous phases are normal. The venous sinuses are patent.   Left internal carotid, during embolization: Injection taken during coil embolization reveals widely patent internal carotid artery and middle cerebral artery.  There is trickle flow through the left A1 and more distal segments, although there is some flow visualized within the A2 and more distal segments.  This is likely due to underlying vasospasm as described above, coupled with presence of the 17 microcatheter.  Coil mass appears to be within the aneurysm, without prolapse into the anterior cerebral artery.  Left internal carotid, post-embolization: Injection reveals patent internal carotid artery, with stable appearance of vasospasm involving the left anterior cerebral artery.  There is mild spasm involving the left middle cerebral artery.  Coil mass is within the aneurysm, without prolapse into the anterior cerebral artery.  There is minimal aneurysm neck residual.  The distal left anterior cerebral artery remains patent.  Left internal carotid, final control: Injection reveals patent internal carotid artery, and stable appearance of anterior and middle cerebral arteries.  Coil mass within the aneurysm is in stable position.  Capillary phase demonstrates good perfusion of the distal anterior and middle cerebral artery territories, without any identified branch occlusions.  Venous sinuses remain patent.  Right femoral:   Normal vessel. No significant atherosclerotic disease. Arterial sheath in adequate position.  DISPOSITION: Upon completion of the study, the femoral sheath was removed and hemostasis obtained using a 8-Fr Angio-Seal  closure device. Good proximal and distal lower extremity pulses were documented upon achievement of hemostasis. The procedure was well tolerated and no early complications were observed.  The patient was transferred to the postanesthesia care unit to  be positioned flat in bed for 4 hours.   IMPRESSION: 1. 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 No immediate local thrombotic or distal embolic complications were noted. 2. 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 There is mild spasm of the distal basilar artery.    Gerldine Maizes, MD [facility]  Electronically signed by Maizes Gerldine, MD at 07/26/2024  5:20 PM   ECHOCARDIOGRAM COMPLETE Result Date: 07/27/2024    ECHOCARDIOGRAM REPORT   Patient Name:   REYA AURICH Date of Exam: 07/27/2024 Medical Rec #:  [id number]        Height:       66.0 in Accession #:    [id number]       Weight:       153.7 lb Date of Birth:  [birth year]        BSA:          1.788 m Patient Age:    68 years         BP:           135/86 mmHg Patient Gender: F                HR:           101 bpm. Exam Location:  Inpatient Procedure: 2D Echo, Cardiac Doppler and Color Doppler (Both Spectral and Color            Flow Doppler were utilized during procedure). Indications:    Bacteremia  History:        Patient has no prior history of Echocardiogram examinations.                 Risk Factors:Hypertension.  Sonographer:    Philomena Daring Referring Phys: [id number] PETER E BABCOCK IMPRESSIONS  1. Thickened mitral valve with a hyperechoic mobile density on the anterior leaflet tip which measures 0.473 cm x 0.694 cm. Cannot rule out a small vegetation, clinical correlation advised. Consider TEE for further characterization.  2. Left  ventricular ejection fraction, by estimation, is 60 to 65%. The left ventricle has normal function. The left ventricle has no regional wall motion abnormalities. Left ventricular diastolic parameters are indeterminate.  3. Right ventricular systolic function is normal. The right ventricular size is normal. The estimated right ventricular systolic pressure is 13.2 mmHg.  4. The mitral valve is abnormal. No evidence of mitral valve regurgitation.  5. The aortic valve is grossly normal. Aortic valve regurgitation is not visualized.  6. The inferior vena cava is normal in size with greater than 50% respiratory variability, suggesting right atrial pressure of 3 mmHg. Comparison(s): No prior Echocardiogram. FINDINGS  Left Ventricle: Left ventricular ejection fraction, by estimation, is 60 to 65%. The left ventricle has normal function. The left ventricle has no regional wall motion abnormalities. The left ventricular internal cavity size was normal in size. There is  no left ventricular hypertrophy. Left ventricular diastolic parameters are indeterminate. Right Ventricle: The right ventricular size is normal. No increase in right ventricular wall thickness. Right ventricular systolic function is normal. The tricuspid regurgitant velocity is 1.60 m/s, and with an assumed right atrial pressure of 3 mmHg, the estimated right ventricular systolic pressure is 13.2 mmHg. Left Atrium: Left atrial size was normal in size. Right Atrium: Right atrial size was normal in size. Pericardium: There is  no evidence of pericardial effusion. Mitral Valve: Thickened mitral valve with a hyperechoic mobile density on the anterior leaflet tip which measures 0.473 cm x 0.694 cm. Cannot rule out a small vegetation, clinical correlation advised. The mitral valve is abnormal. No evidence of mitral valve regurgitation. Tricuspid Valve: The tricuspid valve is grossly normal. Tricuspid valve regurgitation is trivial. There is no evidence of tricuspid  valve vegetation. Aortic Valve: The aortic valve is grossly normal. Aortic valve regurgitation is not visualized. There is no evidence of aortic valve vegetation. Pulmonic Valve: The pulmonic valve was grossly normal. Pulmonic valve regurgitation is trivial. There is no evidence of pulmonic valve vegetation. Aorta: The aortic root and ascending aorta are structurally normal, with no evidence of dilitation. Venous: The inferior vena cava is normal in size with greater than 50% respiratory variability, suggesting right atrial pressure of 3 mmHg. IAS/Shunts: No atrial level shunt detected by color flow Doppler.  LEFT VENTRICLE PLAX 2D LVIDd:         3.70 cm   Diastology LVIDs:         2.46 cm   LV e' medial:    5.55 cm/s LV PW:         0.94 cm   LV E/e' medial:  18.2 LV IVS:        1.05 cm   LV e' lateral:   8.59 cm/s LVOT diam:     1.91 cm   LV E/e' lateral: 11.8 LV SV:         45 LV SV Index:   25 LVOT Area:     2.87 cm  RIGHT VENTRICLE             IVC RV S prime:     10.90 cm/s  IVC diam: 1.26 cm TAPSE (M-mode): 1.8 cm LEFT ATRIUM           Index        RIGHT ATRIUM          Index LA diam:      2.28 cm 1.28 cm/m   RA Area:     8.55 cm LA Vol (A2C): 14.6 ml 8.17 ml/m   RA Volume:   15.20 ml 8.50 ml/m LA Vol (A4C): 18.2 ml 10.18 ml/m  AORTIC VALVE LVOT Vmax:   128.00 cm/s LVOT Vmean:  79.200 cm/s LVOT VTI:    0.158 m  AORTA Ao Root diam: 2.67 cm Ao Asc diam:  3.26 cm MITRAL VALVE                TRICUSPID VALVE MV Area (PHT): 5.38 cm     TR Peak grad:   10.2 mmHg MV Decel Time: 141 msec     TR Vmax:        160.00 cm/s MV E velocity: 101.00 cm/s MV A velocity: 134.00 cm/s  SHUNTS MV E/A ratio:  0.75         Systemic VTI:  0.16 m                             Systemic Diam: 1.91 cm Emeline Calender Electronically signed by Emeline Calender Signature Date/Time: 07/27/2024/2:24:15 PM    Final    DG Abd Portable 1V Result Date: 07/27/2024 CLINICAL DATA:  Feeding tube placement. EXAM: PORTABLE ABDOMEN - 1 VIEW COMPARISON:   July 26, 2024. FINDINGS: Distal tip of feeding tube is seen in expected position of distal stomach. IMPRESSION: Distal tip of  feeding tube seen in expected position of distal stomach. Electronically Signed   By: Lynwood Landy Raddle M.D.   On: 07/27/2024 12:26   EEG adult Result Date: 07/27/2024 Shelton Arlin KIDD, MD     07/27/2024  8:38 AM Patient Name: IMUNIQUE SAMAD MRN: [id number] Epilepsy Attending: Arlin KIDD Shelton Referring Physician/Provider: Jenna Maude BRAVO, NP Date: 07/27/2024 Duration: 24.36 mins Patient history: 68 y.o. 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 EEG to evaluate for seizure. Level of alertness:  comatose/ lethargic AEDs during EEG study: LEV Technical aspects: This EEG study was done with scalp electrodes positioned according to the 10-20 International system of electrode placement. Electrical activity was reviewed with band pass filter of 1-70Hz , sensitivity of 7 uV/mm, display speed of 54mm/sec with a 60Hz  notched filter applied as appropriate. EEG data were recorded continuously and digitally stored.  Video monitoring was available and reviewed as appropriate. Description: EEG showed continuous generalized 3 to 5 Hz theta-delta slowing. Hyperventilation and photic stimulation were not performed.   ABNORMALITY - Continuous slow, generalized IMPRESSION: This study is suggestive of moderate to severe diffuse encephalopathy. No seizures or epileptiform discharges were seen throughout the recording. Arlin KIDD Shelton   DG Abd 1 View Result Date: 07/26/2024 CLINICAL DATA:  Nasogastric tube placement. EXAM: ABDOMEN - 1 VIEW COMPARISON:  None Available. FINDINGS: Tip and side port of the enteric tube below the diaphragm in the stomach. Air within nondilated small and large bowel. Right upper quadrant surgical clips. IMPRESSION: Tip and side port of the enteric tube below the diaphragm in the stomach. Electronically Signed   By: Andrea Gasman M.D.   On: 07/26/2024  21:21   CT HEAD WO CONTRAST ( ) Result Date: 07/26/2024 EXAM: CT HEAD WITHOUT CONTRAST 07/26/2024 08:16:28 PM TECHNIQUE: CT of the head was performed without the administration of intravenous contrast. Automated exposure control, iterative reconstruction, and/or weight based adjustment of the mA/kV was utilized to reduce the radiation dose to as low as reasonably achievable. COMPARISON: None available. CLINICAL HISTORY: Stroke, follow up. FINDINGS: BRAIN AND VENTRICLES: Unchanged appearance of bilateral subarachnoid hemorrhage. Extraventricular drainage catheter in an unchanged position near the foramina of [place]. Slightly decreased size of the frontal horns of the lateral ventricles. No new site of hemorrhage. ORBITS: No acute abnormality. SINUSES: No acute abnormality. SOFT TISSUES AND SKULL: No acute soft tissue abnormality. No skull fracture. VASCULATURE: Embolization coils at the anterior communicating artery. IMPRESSION: 1. Unchanged appearance of bilateral subarachnoid hemorrhage. 2. No new site of hemorrhage. 3. Embolization coils at the anterior communicating artery. 4. Extraventricular drainage catheter in unchanged position near the foramina of [place]. 5. Slightly decreased size of the frontal horns of the lateral ventricles. 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 yo female presenting to the hospital with headache and generalized malaise.  Her CT scan revealed diffuse subarachnoid hemorrhage and ventriculomegaly.  Ventricular drain was placed.  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  She therefore presents for diagnostic cerebral angiogram  and possible aneurysm coiling.  APPROACH:  The technical aspects of the procedure as well as its potential risks and benefits were reviewed with the patient and her son. These risks included  but were not limited bleeding, infection, allergic reaction, damage to organs/vital structures, stroke, non-diagnostic procedure, and the catastrophic outcomes of heart attack, coma, and death. With an understanding of these risks, informed consent was obtained and witnessed.   The patient was placed in the supine position on the angiography table and the skin of right groin prepped in the usual sterile fashion. The procedure was performed under general anesthesia monitored by the anesthesia service.  Short 5Fr sheath was placed in the right common femoral artery using standard seldinger technique. Position of the sheath was documented with fluoro-phase images.   HEPARIN: 0 Units total.   CONTRAST AGENT: See IR records  FLUOROSCOPY TIME: See IR records   CATHETER(S) AND WIRE(S):   5-French JB-1 glidecatheter  0.035 glidewire  80cm 8Fr NeuronMax guide sheath 125cm Berenstein select catheter 115cm Cat 5 Guide catheter 150cm XT 17 microcatheter Synchro select standard microwire  COILS USED: Target 360 XL 4mm x 8cm Target 360 XL 3mm x 9cm Target 360 XL 2mm x 6cm  VESSELS CATHETERIZED:  Right internal carotid  Left internal carotid  Left vertebral   Left anterior cerebral artery  Right common femoral  VESSELS STUDIED:  Right internal carotid, head Left internal carotid, head Left vertebral  Left internal carotid, during embolization Left internal carotid, post-embolization Left internal carotid, final control  Right femoral  PROCEDURAL NARRATIVE:  A 5-Fr JB-1 terumo glide catheter was advanced over a 0.035 glidewire into the aortic arch. The diagnostic portion of the procedure was completed including catheterization of the above vessels and cervical/cerebral angiograms taken. After review of images, the catheter was removed without  incident.  The guide sheath was then introduced over the select catheter and microwire.  Select catheter was then used to select the mid cervical left internal carotid artery. Guide sheath was then advanced over the select catheter into the proximal cervical left internal carotid artery. Select catheter was then removed.  Under roadmap guidance, the guide catheter was introduced over the 17 microcatheter and microwire.  Microwire and microcatheter were used to select the supraclinoid internal carotid, and the guide catheter was advanced to its final position in the cavernous segment of the left internal carotid. The aneurysm was then carefully catheterized and the microwire removed.  The above coils were then sequentially deployed with progressive exclusion of the aneurysm.  After coiling, the microcatheter was removed.  Immediate postembolization angiogram was taken.  The guide catheter and sheath were then withdrawn down and final control angiogram was taken from the cervical internal carotid artery.  The guide catheter and sheath were then synchronously removed without incident.  INTERPRETATION:  Right internal carotid, head:  Injection reveals the presence of a patent ICA, M1, and A1 segments and their branches.  There is moderate to severe vasospasm involving the A1 and proximal A2 segment on the left.  There is mild vasospasm involving the proximal M1 segment and the supraclinoid internal carotid artery.  There is an aneurysm projecting towards the right at the A1 A2 junction measuring approximately 5.3 x 3.1 mm.  The aneurysm has a small inferiorly projecting daughter sac at the dome.  There is a relatively narrow neck.  The parenchymal and venous phases are unremarkable. The venous sinuses are widely patent.   Left internal carotid, head:  Injection reveals the presence of a patent ICA, A1, and M1 segments and their branches. No aneurysms, arteriovenous malformations, or high flow fistulas are visualized.   There is however moderate to severe vasospasm involving  the right A1 and proximal A2 segments.  There is mild spasm involving the supraclinoid internal carotid artery and the right M1.  The parenchymal and venous phases are unremarkable. The venous sinuses are widely patent.   Left vertebral:  Injection reveals the presence of a widely patent vertebral artery. This leads to a widely patent basilar artery that terminates in bilateral P1. No aneurysms, arteriovenous malformations, or high flow fistulas are visualized.  There is mild vasospasm involving the distal basilar artery.  The parenchymal and venous phases are normal. The venous sinuses are patent.   Left internal carotid, during embolization: Injection taken during coil embolization reveals widely patent internal carotid artery and middle cerebral artery.  There is trickle flow through the left A1 and more distal segments, although there is some flow visualized within the A2 and more distal segments.  This is likely due to underlying vasospasm as described above, coupled with presence of the 17 microcatheter.  Coil mass appears to be within the aneurysm, without prolapse into the anterior cerebral artery.  Left internal carotid, post-embolization: Injection reveals patent internal carotid artery, with stable appearance of vasospasm involving the left anterior cerebral artery.  There is mild spasm involving the left middle cerebral artery.  Coil mass is within the aneurysm, without prolapse into the anterior cerebral artery.  There is minimal aneurysm neck residual.  The distal left anterior cerebral artery remains patent.  Left internal carotid, final control: Injection reveals patent internal carotid artery, and stable appearance of anterior and middle cerebral arteries.  Coil mass within the aneurysm is in stable position.  Capillary phase demonstrates good perfusion of the distal anterior and middle cerebral artery territories, without any identified branch  occlusions.  Venous sinuses remain patent.  Right femoral:   Normal vessel. No significant atherosclerotic disease. Arterial sheath in adequate position.  DISPOSITION: Upon completion of the study, the femoral sheath was removed and hemostasis obtained using a 8-Fr Angio-Seal closure device. Good proximal and distal lower extremity pulses were documented upon achievement of hemostasis. The procedure was well tolerated and no early complications were observed.  The patient was transferred to the postanesthesia care unit to be positioned flat in bed for 4 hours.   IMPRESSION: 1. 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 No immediate local thrombotic or distal embolic complications were noted. 2. 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 There is mild spasm of the distal basilar artery.    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 yo female presenting to the hospital with headache and generalized malaise.  Her CT scan revealed diffuse subarachnoid hemorrhage and ventriculomegaly.  Ventricular drain was placed.  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  She therefore presents for diagnostic cerebral angiogram and possible aneurysm coiling.  APPROACH:  The technical aspects of the procedure as well as its potential risks and benefits were reviewed with the patient and her son. These risks included but were  not limited bleeding, infection, allergic reaction, damage to organs/vital structures, stroke, non-diagnostic procedure, and  the catastrophic outcomes of heart attack, coma, and death. With an understanding of these risks, informed consent was obtained and witnessed.   The patient was placed in the supine position on the angiography table and the skin of right groin prepped in the usual sterile fashion. The procedure was performed under general anesthesia monitored by the anesthesia service.  Short 5Fr sheath was placed in the right common femoral artery using standard seldinger technique. Position of the sheath was documented with fluoro-phase images.   HEPARIN: 0 Units total.   CONTRAST AGENT: See IR records  FLUOROSCOPY TIME: See IR records   CATHETER(S) AND WIRE(S):   5-French JB-1 glidecatheter  0.035 glidewire  80cm 8Fr NeuronMax guide sheath 125cm Berenstein select catheter 115cm Cat 5 Guide catheter 150cm XT 17 microcatheter Synchro select standard microwire  COILS USED: Target 360 XL 4mm x 8cm Target 360 XL 3mm x 9cm Target 360 XL 2mm x 6cm  VESSELS CATHETERIZED:  Right internal carotid  Left internal carotid  Left vertebral   Left anterior cerebral artery  Right common femoral  VESSELS STUDIED:  Right internal carotid, head Left internal carotid, head Left vertebral  Left internal carotid, during embolization Left internal carotid, post-embolization Left internal carotid, final control  Right femoral  PROCEDURAL NARRATIVE:  A 5-Fr JB-1 terumo glide catheter was advanced over a 0.035 glidewire into the aortic arch. The diagnostic portion of the procedure was completed including catheterization of the above vessels and cervical/cerebral angiograms taken. After review of images, the catheter was removed without incident.  The guide sheath was then introduced over the select catheter and microwire.  Select catheter was then used to select the mid cervical left internal carotid artery. Guide sheath was then  advanced over the select catheter into the proximal cervical left internal carotid artery. Select catheter was then removed.  Under roadmap guidance, the guide catheter was introduced over the 17 microcatheter and microwire.  Microwire and microcatheter were used to select the supraclinoid internal carotid, and the guide catheter was advanced to its final position in the cavernous segment of the left internal carotid. The aneurysm was then carefully catheterized and the microwire removed.  The above coils were then sequentially deployed with progressive exclusion of the aneurysm.  After coiling, the microcatheter was removed.  Immediate postembolization angiogram was taken.  The guide catheter and sheath were then withdrawn down and final control angiogram was taken from the cervical internal carotid artery.  The guide catheter and sheath were then synchronously removed without incident.  INTERPRETATION:  Right internal carotid, head:  Injection reveals the presence of a patent ICA, M1, and A1 segments and their branches.  There is moderate to severe vasospasm involving the A1 and proximal A2 segment on the left.  There is mild vasospasm involving the proximal M1 segment and the supraclinoid internal carotid artery.  There is an aneurysm projecting towards the right at the A1 A2 junction measuring approximately 5.3 x 3.1 mm.  The aneurysm has a small inferiorly projecting daughter sac at the dome.  There is a relatively narrow neck.  The parenchymal and venous phases are unremarkable. The venous sinuses are widely patent.   Left internal carotid, head:  Injection reveals the presence of a patent ICA, A1, and M1 segments and their branches. No aneurysms, arteriovenous malformations, or high flow fistulas are visualized.  There is however moderate to severe vasospasm involving the right A1 and proximal A2 segments.  There is mild spasm involving the supraclinoid internal carotid  artery and the right M1.  The  parenchymal and venous phases are unremarkable. The venous sinuses are widely patent.   Left vertebral:  Injection reveals the presence of a widely patent vertebral artery. This leads to a widely patent basilar artery that terminates in bilateral P1. No aneurysms, arteriovenous malformations, or high flow fistulas are visualized.  There is mild vasospasm involving the distal basilar artery.  The parenchymal and venous phases are normal. The venous sinuses are patent.   Left internal carotid, during embolization: Injection taken during coil embolization reveals widely patent internal carotid artery and middle cerebral artery.  There is trickle flow through the left A1 and more distal segments, although there is some flow visualized within the A2 and more distal segments.  This is likely due to underlying vasospasm as described above, coupled with presence of the 17 microcatheter.  Coil mass appears to be within the aneurysm, without prolapse into the anterior cerebral artery.  Left internal carotid, post-embolization: Injection reveals patent internal carotid artery, with stable appearance of vasospasm involving the left anterior cerebral artery.  There is mild spasm involving the left middle cerebral artery.  Coil mass is within the aneurysm, without prolapse into the anterior cerebral artery.  There is minimal aneurysm neck residual.  The distal left anterior cerebral artery remains patent.  Left internal carotid, final control: Injection reveals patent internal carotid artery, and stable appearance of anterior and middle cerebral arteries.  Coil mass within the aneurysm is in stable position.  Capillary phase demonstrates good perfusion of the distal anterior and middle cerebral artery territories, without any identified branch occlusions.  Venous sinuses remain patent.  Right femoral:   Normal vessel. No significant atherosclerotic disease. Arterial sheath in adequate position.  DISPOSITION: Upon completion of  the study, the femoral sheath was removed and hemostasis obtained using a 8-Fr Angio-Seal closure device. Good proximal and distal lower extremity pulses were documented upon achievement of hemostasis. The procedure was well tolerated and no early complications were observed.  The patient was transferred to the postanesthesia care unit to be positioned flat in bed for 4 hours.   IMPRESSION: 1. 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 No immediate local thrombotic or distal embolic complications were noted. 2. 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 There is mild spasm of the distal basilar artery.    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 yo female presenting to the hospital with headache and generalized malaise.  Her CT scan revealed diffuse subarachnoid hemorrhage and ventriculomegaly.  Ventricular drain was placed.  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  She therefore presents for diagnostic cerebral angiogram and possible aneurysm coiling.  APPROACH:  The technical aspects of the procedure as well as its potential risks and benefits were reviewed with the patient and her son. These risks included but were not limited bleeding, infection, allergic reaction, damage to organs/vital structures, stroke, non-diagnostic procedure, and the catastrophic outcomes of heart attack, coma, and death. With  an understanding of these risks, informed  consent was obtained and witnessed.   The patient was placed in the supine position on the angiography table and the skin of right groin prepped in the usual sterile fashion. The procedure was performed under general anesthesia monitored by the anesthesia service.  Short 5Fr sheath was placed in the right common femoral artery using standard seldinger technique. Position of the sheath was documented with fluoro-phase images.   HEPARIN: 0 Units total.   CONTRAST AGENT: See IR records  FLUOROSCOPY TIME: See IR records   CATHETER(S) AND WIRE(S):   5-French JB-1 glidecatheter  0.035 glidewire  80cm 8Fr NeuronMax guide sheath 125cm Berenstein select catheter 115cm Cat 5 Guide catheter 150cm XT 17 microcatheter Synchro select standard microwire  COILS USED: Target 360 XL 4mm x 8cm Target 360 XL 3mm x 9cm Target 360 XL 2mm x 6cm  VESSELS CATHETERIZED:  Right internal carotid  Left internal carotid  Left vertebral   Left anterior cerebral artery  Right common femoral  VESSELS STUDIED:  Right internal carotid, head Left internal carotid, head Left vertebral  Left internal carotid, during embolization Left internal carotid, post-embolization Left internal carotid, final control  Right femoral  PROCEDURAL NARRATIVE:  A 5-Fr JB-1 terumo glide catheter was advanced over a 0.035 glidewire into the aortic arch. The diagnostic portion of the procedure was completed including catheterization of the above vessels and cervical/cerebral angiograms taken. After review of images, the catheter was removed without incident.  The guide sheath was then introduced over the select catheter and microwire.  Select catheter was then used to select the mid cervical left internal carotid artery. Guide sheath was then advanced over the select catheter into the proximal cervical left internal carotid artery. Select catheter was then removed.  Under roadmap guidance, the guide catheter was introduced over the  17 microcatheter and microwire.  Microwire and microcatheter were used to select the supraclinoid internal carotid, and the guide catheter was advanced to its final position in the cavernous segment of the left internal carotid. The aneurysm was then carefully catheterized and the microwire removed.  The above coils were then sequentially deployed with progressive exclusion of the aneurysm.  After coiling, the microcatheter was removed.  Immediate postembolization angiogram was taken.  The guide catheter and sheath were then withdrawn down and final control angiogram was taken from the cervical internal carotid artery.  The guide catheter and sheath were then synchronously removed without incident.  INTERPRETATION:  Right internal carotid, head:  Injection reveals the presence of a patent ICA, M1, and A1 segments and their branches.  There is moderate to severe vasospasm involving the A1 and proximal A2 segment on the left.  There is mild vasospasm involving the proximal M1 segment and the supraclinoid internal carotid artery.  There is an aneurysm projecting towards the right at the A1 A2 junction measuring approximately 5.3 x 3.1 mm.  The aneurysm has a small inferiorly projecting daughter sac at the dome.  There is a relatively narrow neck.  The parenchymal and venous phases are unremarkable. The venous sinuses are widely patent.   Left internal carotid, head:  Injection reveals the presence of a patent ICA, A1, and M1 segments and their branches. No aneurysms, arteriovenous malformations, or high flow fistulas are visualized.  There is however moderate to severe vasospasm involving the right A1 and proximal A2 segments.  There is mild spasm involving the supraclinoid internal carotid artery and the right M1.  The parenchymal and venous phases are unremarkable. The venous sinuses  are widely patent.   Left vertebral:  Injection reveals the presence of a widely patent vertebral artery. This leads to a widely patent  basilar artery that terminates in bilateral P1. No aneurysms, arteriovenous malformations, or high flow fistulas are visualized.  There is mild vasospasm involving the distal basilar artery.  The parenchymal and venous phases are normal. The venous sinuses are patent.   Left internal carotid, during embolization: Injection taken during coil embolization reveals widely patent internal carotid artery and middle cerebral artery.  There is trickle flow through the left A1 and more distal segments, although there is some flow visualized within the A2 and more distal segments.  This is likely due to underlying vasospasm as described above, coupled with presence of the 17 microcatheter.  Coil mass appears to be within the aneurysm, without prolapse into the anterior cerebral artery.  Left internal carotid, post-embolization: Injection reveals patent internal carotid artery, with stable appearance of vasospasm involving the left anterior cerebral artery.  There is mild spasm involving the left middle cerebral artery.  Coil mass is within the aneurysm, without prolapse into the anterior cerebral artery.  There is minimal aneurysm neck residual.  The distal left anterior cerebral artery remains patent.  Left internal carotid, final control: Injection reveals patent internal carotid artery, and stable appearance of anterior and middle cerebral arteries.  Coil mass within the aneurysm is in stable position.  Capillary phase demonstrates good perfusion of the distal anterior and middle cerebral artery territories, without any identified branch occlusions.  Venous sinuses remain patent.  Right femoral:   Normal vessel. No significant atherosclerotic disease. Arterial sheath in adequate position.  DISPOSITION: Upon completion of the study, the femoral sheath was removed and hemostasis obtained using a 8-Fr Angio-Seal closure device. Good proximal and distal lower extremity pulses were documented upon achievement of hemostasis. The  procedure was well tolerated and no early complications were observed.  The patient was transferred to the postanesthesia care unit to be positioned flat in bed for 4 hours.   IMPRESSION: 1. 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 No immediate local thrombotic or distal embolic complications were noted. 2. 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 There is mild spasm of the distal basilar artery.    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 yo female presenting to the hospital with headache and generalized malaise.  Her CT scan revealed diffuse subarachnoid hemorrhage and ventriculomegaly.  Ventricular drain was placed.  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  She therefore presents for diagnostic cerebral angiogram and possible aneurysm coiling.  APPROACH:  The technical aspects of the procedure as well as its potential risks and benefits were reviewed with the patient and her son. These risks included but were not limited bleeding, infection, allergic reaction, damage to organs/vital structures, stroke, non-diagnostic procedure, and the catastrophic outcomes of heart attack, coma, and death. With an understanding of these risks, informed consent was obtained and witnessed.   The patient was placed in the supine position on the  angiography table and the skin of right groin prepped in the usual  sterile fashion. The procedure was performed under general anesthesia monitored by the anesthesia service.  Short 5Fr sheath was placed in the right common femoral artery using standard seldinger technique. Position of the sheath was documented with fluoro-phase images.   HEPARIN: 0 Units total.   CONTRAST AGENT: See IR records  FLUOROSCOPY TIME: See IR records   CATHETER(S) AND WIRE(S):   5-French JB-1 glidecatheter  0.035 glidewire  80cm 8Fr NeuronMax guide sheath 125cm Berenstein select catheter 115cm Cat 5 Guide catheter 150cm XT 17 microcatheter Synchro select standard microwire  COILS USED: Target 360 XL 4mm x 8cm Target 360 XL 3mm x 9cm Target 360 XL 2mm x 6cm  VESSELS CATHETERIZED:  Right internal carotid  Left internal carotid  Left vertebral   Left anterior cerebral artery  Right common femoral  VESSELS STUDIED:  Right internal carotid, head Left internal carotid, head Left vertebral  Left internal carotid, during embolization Left internal carotid, post-embolization Left internal carotid, final control  Right femoral  PROCEDURAL NARRATIVE:  A 5-Fr JB-1 terumo glide catheter was advanced over a 0.035 glidewire into the aortic arch. The diagnostic portion of the procedure was completed including catheterization of the above vessels and cervical/cerebral angiograms taken. After review of images, the catheter was removed without incident.  The guide sheath was then introduced over the select catheter and microwire.  Select catheter was then used to select the mid cervical left internal carotid artery. Guide sheath was then advanced over the select catheter into the proximal cervical left internal carotid artery. Select catheter was then removed.  Under roadmap guidance, the guide catheter was introduced over the 17 microcatheter and microwire.  Microwire and microcatheter were used to select the supraclinoid internal carotid, and the guide catheter was advanced to its final position in the cavernous segment  of the left internal carotid. The aneurysm was then carefully catheterized and the microwire removed.  The above coils were then sequentially deployed with progressive exclusion of the aneurysm.  After coiling, the microcatheter was removed.  Immediate postembolization angiogram was taken.  The guide catheter and sheath were then withdrawn down and final control angiogram was taken from the cervical internal carotid artery.  The guide catheter and sheath were then synchronously removed without incident.  INTERPRETATION:  Right internal carotid, head:  Injection reveals the presence of a patent ICA, M1, and A1 segments and their branches.  There is moderate to severe vasospasm involving the A1 and proximal A2 segment on the left.  There is mild vasospasm involving the proximal M1 segment and the supraclinoid internal carotid artery.  There is an aneurysm projecting towards the right at the A1 A2 junction measuring approximately 5.3 x 3.1 mm.  The aneurysm has a small inferiorly projecting daughter sac at the dome.  There is a relatively narrow neck.  The parenchymal and venous phases are unremarkable. The venous sinuses are widely patent.   Left internal carotid, head:  Injection reveals the presence of a patent ICA, A1, and M1 segments and their branches. No aneurysms, arteriovenous malformations, or high flow fistulas are visualized.  There is however moderate to severe vasospasm involving the right A1 and proximal A2 segments.  There is mild spasm involving the supraclinoid internal carotid artery and the right M1.  The parenchymal and venous phases are unremarkable. The venous sinuses are widely patent.   Left vertebral:  Injection reveals the presence of a widely patent  vertebral artery. This leads to a widely patent basilar artery that terminates in bilateral P1. No aneurysms, arteriovenous malformations, or high flow fistulas are visualized.  There is mild vasospasm involving the distal basilar artery.  The  parenchymal and venous phases are normal. The venous sinuses are patent.   Left internal carotid, during embolization: Injection taken during coil embolization reveals widely patent internal carotid artery and middle cerebral artery.  There is trickle flow through the left A1 and more distal segments, although there is some flow visualized within the A2 and more distal segments.  This is likely due to underlying vasospasm as described above, coupled with presence of the 17 microcatheter.  Coil mass appears to be within the aneurysm, without prolapse into the anterior cerebral artery.  Left internal carotid, post-embolization: Injection reveals patent internal carotid artery, with stable appearance of vasospasm involving the left anterior cerebral artery.  There is mild spasm involving the left middle cerebral artery.  Coil mass is within the aneurysm, without prolapse into the anterior cerebral artery.  There is minimal aneurysm neck residual.  The distal left anterior cerebral artery remains patent.  Left internal carotid, final control: Injection reveals patent internal carotid artery, and stable appearance of anterior and middle cerebral arteries.  Coil mass within the aneurysm is in stable position.  Capillary phase demonstrates good perfusion of the distal anterior and middle cerebral artery territories, without any identified branch occlusions.  Venous sinuses remain patent.  Right femoral:   Normal vessel. No significant atherosclerotic disease. Arterial sheath in adequate position.  DISPOSITION: Upon completion of the study, the femoral sheath was removed and hemostasis obtained using a 8-Fr Angio-Seal closure device. Good proximal and distal lower extremity pulses were documented upon achievement of hemostasis. The procedure was well tolerated and no early complications were observed.  The patient was transferred to the postanesthesia care unit to be positioned flat in bed for 4 hours.   IMPRESSION: 1.  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 No immediate local thrombotic or distal embolic complications were noted. 2. 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 There is mild spasm of the distal basilar artery.    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 yo female presenting to the hospital with headache and generalized malaise.  Her CT scan revealed diffuse subarachnoid hemorrhage and ventriculomegaly.  Ventricular drain was placed.  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  She therefore presents for diagnostic cerebral angiogram and possible aneurysm coiling.  APPROACH:  The technical aspects of the procedure as well as its potential risks and benefits were reviewed with the patient and her son. These risks included but were not limited bleeding, infection, allergic reaction, damage to organs/vital structures, stroke, non-diagnostic procedure, and the catastrophic outcomes of heart attack, coma, and death. With an understanding of these risks, informed consent was obtained and witnessed.   The patient was placed in the supine position on the angiography table and the skin of right groin prepped in the  usual sterile fashion. The procedure was performed under general anesthesia monitored by the anesthesia service.  Short 5Fr sheath was placed in the right common femoral  artery using standard seldinger technique. Position of the sheath was documented with fluoro-phase images.   HEPARIN: 0 Units total.   CONTRAST AGENT: See IR records  FLUOROSCOPY TIME: See IR records   CATHETER(S) AND WIRE(S):   5-French JB-1 glidecatheter  0.035 glidewire  80cm 8Fr NeuronMax guide sheath 125cm Berenstein select catheter 115cm Cat 5 Guide catheter 150cm XT 17 microcatheter Synchro select standard microwire  COILS USED: Target 360 XL 4mm x 8cm Target 360 XL 3mm x 9cm Target 360 XL 2mm x 6cm  VESSELS CATHETERIZED:  Right internal carotid  Left internal carotid  Left vertebral   Left anterior cerebral artery  Right common femoral  VESSELS STUDIED:  Right internal carotid, head Left internal carotid, head Left vertebral  Left internal carotid, during embolization Left internal carotid, post-embolization Left internal carotid, final control  Right femoral  PROCEDURAL NARRATIVE:  A 5-Fr JB-1 terumo glide catheter was advanced over a 0.035 glidewire into the aortic arch. The diagnostic portion of the procedure was completed including catheterization of the above vessels and cervical/cerebral angiograms taken. After review of images, the catheter was removed without incident.  The guide sheath was then introduced over the select catheter and microwire.  Select catheter was then used to select the mid cervical left internal carotid artery. Guide sheath was then advanced over the select catheter into the proximal cervical left internal carotid artery. Select catheter was then removed.  Under roadmap guidance, the guide catheter was introduced over the 17 microcatheter and microwire.  Microwire and microcatheter were used to select the supraclinoid internal carotid, and the guide catheter was advanced to its final position in the cavernous segment of the left internal carotid. The aneurysm was then carefully catheterized and the microwire removed.  The above coils were then sequentially deployed with  progressive exclusion of the aneurysm.  After coiling, the microcatheter was removed.  Immediate postembolization angiogram was taken.  The guide catheter and sheath were then withdrawn down and final control angiogram was taken from the cervical internal carotid artery.  The guide catheter and sheath were then synchronously removed without incident.  INTERPRETATION:  Right internal carotid, head:  Injection reveals the presence of a patent ICA, M1, and A1 segments and their branches.  There is moderate to severe vasospasm involving the A1 and proximal A2 segment on the left.  There is mild vasospasm involving the proximal M1 segment and the supraclinoid internal carotid artery.  There is an aneurysm projecting towards the right at the A1 A2 junction measuring approximately 5.3 x 3.1 mm.  The aneurysm has a small inferiorly projecting daughter sac at the dome.  There is a relatively narrow neck.  The parenchymal and venous phases are unremarkable. The venous sinuses are widely patent.   Left internal carotid, head:  Injection reveals the presence of a patent ICA, A1, and M1 segments and their branches. No aneurysms, arteriovenous malformations, or high flow fistulas are visualized.  There is however moderate to severe vasospasm involving the right A1 and proximal A2 segments.  There is mild spasm involving the supraclinoid internal carotid artery and the right M1.  The parenchymal and venous phases are unremarkable. The venous sinuses are widely patent.   Left vertebral:  Injection reveals the presence of a widely patent vertebral artery. This leads to a widely patent basilar artery that  terminates in bilateral P1. No aneurysms, arteriovenous malformations, or high flow fistulas are visualized.  There is mild vasospasm involving the distal basilar artery.  The parenchymal and venous phases are normal. The venous sinuses are patent.   Left internal carotid, during embolization: Injection taken during coil  embolization reveals widely patent internal carotid artery and middle cerebral artery.  There is trickle flow through the left A1 and more distal segments, although there is some flow visualized within the A2 and more distal segments.  This is likely due to underlying vasospasm as described above, coupled with presence of the 17 microcatheter.  Coil mass appears to be within the aneurysm, without prolapse into the anterior cerebral artery.  Left internal carotid, post-embolization: Injection reveals patent internal carotid artery, with stable appearance of vasospasm involving the left anterior cerebral artery.  There is mild spasm involving the left middle cerebral artery.  Coil mass is within the aneurysm, without prolapse into the anterior cerebral artery.  There is minimal aneurysm neck residual.  The distal left anterior cerebral artery remains patent.  Left internal carotid, final control: Injection reveals patent internal carotid artery, and stable appearance of anterior and middle cerebral arteries.  Coil mass within the aneurysm is in stable position.  Capillary phase demonstrates good perfusion of the distal anterior and middle cerebral artery territories, without any identified branch occlusions.  Venous sinuses remain patent.  Right femoral:   Normal vessel. No significant atherosclerotic disease. Arterial sheath in adequate position.  DISPOSITION: Upon completion of the study, the femoral sheath was removed and hemostasis obtained using a 8-Fr Angio-Seal closure device. Good proximal and distal lower extremity pulses were documented upon achievement of hemostasis. The procedure was well tolerated and no early complications were observed.  The patient was transferred to the postanesthesia care unit to be positioned flat in bed for 4 hours.   IMPRESSION: 1. 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 No immediate local thrombotic or distal embolic  complications were noted. 2. 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 There is mild spasm of the distal basilar artery.    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 yo female presenting to the hospital with headache and generalized malaise.  Her CT scan revealed diffuse subarachnoid hemorrhage and ventriculomegaly.  Ventricular drain was placed.  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  She therefore presents for diagnostic cerebral angiogram and possible aneurysm coiling.  APPROACH:  The technical aspects of the procedure as well as its potential risks and benefits were reviewed with the patient and her son. These risks included but were not limited bleeding, infection, allergic reaction, damage to organs/vital structures, stroke, non-diagnostic procedure, and the catastrophic outcomes of heart attack, coma, and death. With an understanding of these risks, informed consent was obtained and witnessed.   The patient was placed in the supine position on the angiography table and the skin of right groin prepped in the usual sterile fashion. The procedure was performed under general anesthesia monitored by the anesthesia service.  Short 5Fr sheath  was placed in the right common femoral artery using standard seldinger technique. Position of the sheath was documented with fluoro-phase images.   HEPARIN: 0 Units total.   CONTRAST AGENT: See IR records  FLUOROSCOPY TIME: See  IR records   CATHETER(S) AND WIRE(S):   5-French JB-1 glidecatheter  0.035 glidewire  80cm 8Fr NeuronMax guide sheath 125cm Berenstein select catheter 115cm Cat 5 Guide catheter 150cm XT 17 microcatheter Synchro select standard microwire  COILS USED: Target 360 XL 4mm x 8cm Target 360 XL 3mm x 9cm Target 360 XL 2mm x 6cm  VESSELS CATHETERIZED:  Right internal carotid  Left internal carotid  Left vertebral   Left anterior cerebral artery  Right common femoral  VESSELS STUDIED:  Right internal carotid, head Left internal carotid, head Left vertebral  Left internal carotid, during embolization Left internal carotid, post-embolization Left internal carotid, final control  Right femoral  PROCEDURAL NARRATIVE:  A 5-Fr JB-1 terumo glide catheter was advanced over a 0.035 glidewire into the aortic arch. The diagnostic portion of the procedure was completed including catheterization of the above vessels and cervical/cerebral angiograms taken. After review of images, the catheter was removed without incident.  The guide sheath was then introduced over the select catheter and microwire.  Select catheter was then used to select the mid cervical left internal carotid artery. Guide sheath was then advanced over the select catheter into the proximal cervical left internal carotid artery. Select catheter was then removed.  Under roadmap guidance, the guide catheter was introduced over the 17 microcatheter and microwire.  Microwire and microcatheter were used to select the supraclinoid internal carotid, and the guide catheter was advanced to its final position in the cavernous segment of the left internal carotid. The aneurysm was then carefully catheterized and the microwire removed.  The above coils were then sequentially deployed with progressive exclusion of the aneurysm.  After coiling, the microcatheter was removed.  Immediate postembolization angiogram was taken.  The guide catheter and sheath were then withdrawn down and  final control angiogram was taken from the cervical internal carotid artery.  The guide catheter and sheath were then synchronously removed without incident.  INTERPRETATION:  Right internal carotid, head:  Injection reveals the presence of a patent ICA, M1, and A1 segments and their branches.  There is moderate to severe vasospasm involving the A1 and proximal A2 segment on the left.  There is mild vasospasm involving the proximal M1 segment and the supraclinoid internal carotid artery.  There is an aneurysm projecting towards the right at the A1 A2 junction measuring approximately 5.3 x 3.1 mm.  The aneurysm has a small inferiorly projecting daughter sac at the dome.  There is a relatively narrow neck.  The parenchymal and venous phases are unremarkable. The venous sinuses are widely patent.   Left internal carotid, head:  Injection reveals the presence of a patent ICA, A1, and M1 segments and their branches. No aneurysms, arteriovenous malformations, or high flow fistulas are visualized.  There is however moderate to severe vasospasm involving the right A1 and proximal A2 segments.  There is mild spasm involving the supraclinoid internal carotid artery and the right M1.  The parenchymal and venous phases are unremarkable. The venous sinuses are widely patent.   Left vertebral:  Injection reveals the presence of a widely patent vertebral artery. This leads to a widely patent basilar artery that terminates in bilateral P1. No aneurysms, arteriovenous malformations, or high flow fistulas are visualized.  There is mild vasospasm  involving the distal basilar artery.  The parenchymal and venous phases are normal. The venous sinuses are patent.   Left internal carotid, during embolization: Injection taken during coil embolization reveals widely patent internal carotid artery and middle cerebral artery.  There is trickle flow through the left A1 and more distal segments, although there is some flow visualized within the  A2 and more distal segments.  This is likely due to underlying vasospasm as described above, coupled with presence of the 17 microcatheter.  Coil mass appears to be within the aneurysm, without prolapse into the anterior cerebral artery.  Left internal carotid, post-embolization: Injection reveals patent internal carotid artery, with stable appearance of vasospasm involving the left anterior cerebral artery.  There is mild spasm involving the left middle cerebral artery.  Coil mass is within the aneurysm, without prolapse into the anterior cerebral artery.  There is minimal aneurysm neck residual.  The distal left anterior cerebral artery remains patent.  Left internal carotid, final control: Injection reveals patent internal carotid artery, and stable appearance of anterior and middle cerebral arteries.  Coil mass within the aneurysm is in stable position.  Capillary phase demonstrates good perfusion of the distal anterior and middle cerebral artery territories, without any identified branch occlusions.  Venous sinuses remain patent.  Right femoral:   Normal vessel. No significant atherosclerotic disease. Arterial sheath in adequate position.  DISPOSITION: Upon completion of the study, the femoral sheath was removed and hemostasis obtained using a 8-Fr Angio-Seal closure device. Good proximal and distal lower extremity pulses were documented upon achievement of hemostasis. The procedure was well tolerated and no early complications were observed.  The patient was transferred to the postanesthesia care unit to be positioned flat in bed for 4 hours.   IMPRESSION: 1. 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 No immediate local thrombotic or distal embolic complications were noted. 2. Moderate to severe vasospasm involving primarily the proximal segments of the anterior cerebral arteries bilaterally, with mild-moderate spasm of the supraclinoid carotid  arteries and proximal MCA segments bilaterally. There is mild spasm of the distal basilar artery.    Gerldine Maizes, MD [facility]  Electronically signed by Maizes Gerldine, MD at 07/26/2024  5:20 PM   CT HEAD WO CONTRAST ( ) Result Date: 07/26/2024 CLINICAL DATA:  68 year old female with subarachnoid hemorrhage, ruptured anterior communicating artery aneurysm. Status post EVD. EXAM: CT HEAD WITHOUT CONTRAST TECHNIQUE: Contiguous axial images were obtained from the base of the skull through the vertex without intravenous contrast. RADIATION DOSE REDUCTION: This exam was performed according to the departmental dose-optimization program which includes automated exposure control, adjustment of the mA and/or kV according to patient size and/or use of iterative reconstruction technique. COMPARISON:  CTA head and neck and head CT yesterday. FINDINGS: Brain: New right superior frontal approach external ventricular drain, which passes through the right frontal horn and terminates at the 3rd ventricle near midline (coronal images 35 and 38). Trace postoperative pneumocephalus. Stable to slightly decreased ventricle size since presentation (coronal image 32 now versus series 6, image 22 at 1920 hours yesterday. No IVH. No transependymal edema. Bilateral hyperdense subarachnoid hemorrhage does not appear significantly changed, most pronounced in the suprasellar cistern, prepontine cistern and tracking in both sylvian fissures. Basilar cisterns otherwise patent. No significant intracranial mass effect, no midline shift. Stable gray-white matter differentiation throughout the brain. Vascular: Calcified atherosclerosis at the skull base. Skull: New right vertex burr hole.  Otherwise intact. Sinuses/Orbits:  Mild paranasal sinus mucosal thickening is stable. Tympanic cavities and mastoids remain well aerated. Other: New postoperative changes right vertex. Visualized orbit soft tissues are  within normal limits. IMPRESSION: 1. New right superior frontal approach EVD with no adverse features. Stable to slightly decreased lateral and 3rd ventricle size. 2. Stable bilateral subarachnoid hemorrhage. 3. No new intracranial abnormality. Electronically Signed   By: VEAR Hurst M.D.   On: 07/26/2024 05:15   CT ANGIO HEAD NECK W WO CM Result Date: 07/25/2024 CLINICAL DATA:  Follow-up examination for subarachnoid hemorrhage. EXAM: CT ANGIOGRAPHY HEAD AND NECK WITH AND WITHOUT CONTRAST TECHNIQUE: Multidetector CT imaging of the head and neck was performed using the standard protocol during bolus administration of intravenous contrast. Multiplanar CT image reconstructions and MIPs were obtained to evaluate the vascular anatomy. Carotid stenosis measurements (when applicable) are obtained utilizing NASCET criteria, using the distal internal carotid diameter as the denominator. RADIATION DOSE REDUCTION: This exam was performed according to the departmental dose-optimization program which includes automated exposure control, adjustment of the mA and/or kV according to patient size and/or use of iterative reconstruction technique. CONTRAST:  60mL OMNIPAQUE  IOHEXOL  350 MG/ML SOLN COMPARISON:  Prior CT from earlier the same day. FINDINGS: CTA NECK FINDINGS Aortic arch: Standard branching. Imaged portion shows no evidence of aneurysm or dissection. No significant stenosis of the major arch vessel origins. Right carotid system: No evidence of dissection, stenosis (50% or greater), or occlusion. Left carotid system: No evidence of dissection, stenosis (50% or greater), or occlusion. Vertebral arteries: Left vertebral artery dominant. No evidence of dissection, stenosis (50% or greater), or occlusion. Skeleton: No worrisome osseous lesions.  Patient is edentulous. Other neck: No other acute finding. Upper chest: No other acute finding. Review of the MIP images confirms the above findings CTA HEAD FINDINGS Anterior  circulation: Atheromatous change about the carotid siphons without hemodynamically significant stenosis. A1 segments patent bilaterally, with the left being slightly dominant. Saccular aneurysm extending superiorly and towards the right arises from the anterior communicating artery complex, measuring 3 x 3 x 6 mm, likely the source of subarachnoid hemorrhage (series 9, image 20). Both ACAs patent distally without significant stenosis. M1 segments patent without stenosis. The proximal MCA branch occlusions are somewhat narrowed and attenuated, greater on the left, potentially reflecting changes of vaso spasm given the underlying subarachnoid hemorrhage. No visible proximal MCA branch occlusion. Distal MCA branches perfused and symmetric. Posterior circulation: Left V4 segment dominant and patent without stenosis. Left PICA patent. Right vertebral artery largely terminates in PICA. Right PICA patent as well. Basilar mildly irregular but patent without significant stenosis. Superior cerebral arteries patent bilaterally. Left PCA is irregular and attenuated, potentially reflecting a degree of vasospasm. PCAs otherwise patent to their distal aspects. Venous sinuses: Grossly patent allowing for timing of contrast bolus. Anatomic variants: As above. Review of the MIP images confirms the above findings IMPRESSION: 1. 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 2. Mild irregularity and attenuation of the proximal MCA branches and left PCA, potentially reflecting a degree of vasospasm given the underlying subarachnoid hemorrhage. No large vessel occlusion. 3. Otherwise wide patency of the major arterial vasculature of the head and neck. No other hemodynamically significant or correctable stenosis. Electronically Signed   By: Morene Hoard M.D.   On: 07/25/2024 21:18   CT HEAD WO CONTRAST ( ) Result Date: 07/25/2024 CLINICAL  DATA:  Delirium. EXAM: CT HEAD WITHOUT CONTRAST TECHNIQUE:  Contiguous axial images were obtained from the base of the skull through the vertex without intravenous contrast. RADIATION DOSE REDUCTION: This exam was performed according to the departmental dose-optimization program which includes automated exposure control, adjustment of the mA and/or kV according to patient size and/or use of iterative reconstruction technique. COMPARISON:  None Available. FINDINGS: Brain: Mild diffuse increased density throughout the sulci typical of subarachnoid hemorrhage, more so in the left temporal region series 3, image 11. The ventricular system is mildly prominent. Questionable trace blood within the occipital horn of the left lateral ventricle. No subdural or extra-axial collection. No evidence of acute ischemia. Vascular: Rounded hyperdensity in the region of the anterior communicating artery, series 3, image 8, may represent aneurysm. There is no hyperdense vessel. Skull: No fracture or focal lesion. Sinuses/Orbits: Minor mucosal thickening of paranasal sinuses. No mastoid effusion. Other: None. IMPRESSION: 1. Mild diffuse increased density throughout the sulci typical of subarachnoid hemorrhage, more so in the left temporal region. 2. Rounded hyperdensity in the region of the anterior communicating artery may represent aneurysm. Recommend further evaluation with CTA. 3. Mild prominence of the ventricular system. Questionable trace blood within the occipital horn of the left lateral ventricle. Recommend attention at follow-up. Critical Value/emergent results were called by telephone at the time of interpretation on 07/25/2024 at 7:54 pm to provider [facility] , who verbally acknowledged these results. Electronically Signed   By: Andrea Gasman M.D.   On: 07/25/2024 19:54   CT Angio Chest Pulmonary Embolism (PE) W or WO Contrast Result Date: 07/25/2024 CLINICAL DATA:  Sepsis and weakness.  High probability for PE. EXAM: CT  ANGIOGRAPHY CHEST CT ABDOMEN AND PELVIS WITH CONTRAST TECHNIQUE: Multidetector CT imaging of the chest was performed using the standard protocol during bolus administration of intravenous contrast. Multiplanar CT image reconstructions and MIPs were obtained to evaluate the vascular anatomy. Multidetector CT imaging of the abdomen and pelvis was performed using the standard protocol during bolus administration of intravenous contrast. RADIATION DOSE REDUCTION: This exam was performed according to the departmental dose-optimization program which includes automated exposure control, adjustment of the mA and/or kV according to patient size and/or use of iterative reconstruction technique. CONTRAST:  OMNIPAQUE  IOHEXOL  350 MG/ML SOLN COMPARISON:  CT abdomen and pelvis 01/17/2015. FINDINGS: CTA CHEST FINDINGS Cardiovascular: Satisfactory opacification of the pulmonary arteries to the segmental level. No evidence of pulmonary embolism. Normal heart size. No pericardial effusion. Mediastinum/Nodes: No enlarged mediastinal, hilar, or axillary lymph nodes. Thyroid gland, trachea, and esophagus demonstrate no significant findings. Lungs/Pleura: There is minimal bibasilar atelectasis. The lungs are otherwise clear. There is no pleural effusion or pneumothorax. Musculoskeletal: No chest wall abnormality. No acute or significant osseous findings. Review of the MIP images confirms the above findings. CT ABDOMEN and PELVIS FINDINGS Hepatobiliary: No focal liver abnormality is seen. Status post cholecystectomy. No biliary dilatation. Pancreas: Unremarkable. No pancreatic ductal dilatation or surrounding inflammatory changes. Spleen: Normal in size without focal abnormality. Adrenals/Urinary Tract: There is a subcentimeter hypodensity in each kidney. These are too small to characterize, but favored as cysts. Otherwise, the kidneys, adrenal glands and bladder are within normal limits. Stomach/Bowel: Stomach is within normal  limits. Appendix appears normal. No evidence of bowel wall thickening, distention, or inflammatory changes. Vascular/Lymphatic: Aortic atherosclerosis. No enlarged abdominal or pelvic lymph nodes. Reproductive: Uterus and bilateral adnexa are unremarkable. Other: No abdominal wall hernia or abnormality. No abdominopelvic ascites. Musculoskeletal: No acute osseous findings. Degenerative changes affect the hips and spine. Review of the MIP images  confirms the above findings. IMPRESSION: 1. No evidence for pulmonary embolism. 2. No acute localizing process in the chest, abdomen or pelvis. 3. Subcentimeter likely renal cysts, too small to characterize. No follow-up imaging recommended. Aortic Atherosclerosis (ICD10-I70.0). Electronically Signed   By: Greig Pique M.D.   On: 07/25/2024 19:50   CT ABDOMEN PELVIS W CONTRAST Result Date: 07/25/2024 CLINICAL DATA:  Sepsis and weakness.  High probability for PE. EXAM: CT ANGIOGRAPHY CHEST CT ABDOMEN AND PELVIS WITH CONTRAST TECHNIQUE: Multidetector CT imaging of the chest was performed using the standard protocol during bolus administration of intravenous contrast. Multiplanar CT image reconstructions and MIPs were obtained to evaluate the vascular anatomy. Multidetector CT imaging of the abdomen and pelvis was performed using the standard protocol during bolus administration of intravenous contrast. RADIATION DOSE REDUCTION: This exam was performed according to the departmental dose-optimization program which includes automated exposure control, adjustment of the mA and/or kV according to patient size and/or use of iterative reconstruction technique. CONTRAST:  OMNIPAQUE  IOHEXOL  350 MG/ML SOLN COMPARISON:  CT abdomen and pelvis 01/17/2015. FINDINGS: CTA CHEST FINDINGS Cardiovascular: Satisfactory opacification of the pulmonary arteries to the segmental level. No evidence of pulmonary embolism. Normal heart size. No pericardial effusion. Mediastinum/Nodes: No  enlarged mediastinal, hilar, or axillary lymph nodes. Thyroid gland, trachea, and esophagus demonstrate no significant findings. Lungs/Pleura: There is minimal bibasilar atelectasis. The lungs are otherwise clear. There is no pleural effusion or pneumothorax. Musculoskeletal: No chest wall abnormality. No acute or significant osseous findings. Review of the MIP images confirms the above findings. CT ABDOMEN and PELVIS FINDINGS Hepatobiliary: No focal liver abnormality is seen. Status post cholecystectomy. No biliary dilatation. Pancreas: Unremarkable. No pancreatic ductal dilatation or surrounding inflammatory changes. Spleen: Normal in size without focal abnormality. Adrenals/Urinary Tract: There is a subcentimeter hypodensity in each kidney. These are too small to characterize, but favored as cysts. Otherwise, the kidneys, adrenal glands and bladder are within normal limits. Stomach/Bowel: Stomach is within normal limits. Appendix appears normal. No evidence of bowel wall thickening, distention, or inflammatory changes. Vascular/Lymphatic: Aortic atherosclerosis. No enlarged abdominal or pelvic lymph nodes. Reproductive: Uterus and bilateral adnexa are unremarkable. Other: No abdominal wall hernia or abnormality. No abdominopelvic ascites. Musculoskeletal: No acute osseous findings. Degenerative changes affect the hips and spine. Review of the MIP images confirms the above findings. IMPRESSION: 1. No evidence for pulmonary embolism. 2. No acute localizing process in the chest, abdomen or pelvis. 3. Subcentimeter likely renal cysts, too small to characterize. No follow-up imaging recommended. Aortic Atherosclerosis (ICD10-I70.0). Electronically Signed   By: Greig Pique M.D.   On: 07/25/2024 19:50   DG Chest Port 1 View Result Date: 07/25/2024 CLINICAL DATA:  Questionable sepsis - evaluate for abnormality Weakness.  Confusion. EXAM: PORTABLE CHEST 1 VIEW COMPARISON:  11/24/2017 FINDINGS: The cardiomediastinal  contours are normal. The lungs are clear. Pulmonary vasculature is normal. No consolidation, pleural effusion, or pneumothorax. No acute osseous abnormalities are seen. Overlying clothing artifact, unable to be removed. IMPRESSION: No active disease. Electronically Signed   By: Andrea Gasman M.D.   On: 07/25/2024 16:20    Microbiology: No results found for this or any previous visit (from the past 240 hours).  Time spent: 30 minutes  Signed: Yetta Blanch, MD 2024-09-05

## 2024-08-25 NOTE — Code Documentation (Addendum)
  Patient Name: Daisy [name]   MRN: [id number]   Date of Birth/ Sex: [birth year] , female      Admission Date: 07/25/2024  Attending Provider: Tobie Yetta HERO, MD  Primary Diagnosis: SIRS (systemic inflammatory response syndrome) (HCC)   Indication: Pt was in her usual state of health until this PM, when she was noted to be having intermittent episodes of tachypnea then found to be in asystole. Code blue was subsequently called. At the time of arrival on scene, ACLS protocol was underway.   Technical Description:  - CPR performance duration:  24  minutes  - Was defibrillation or cardioversion used? No   - Was external pacer placed? No  - Was patient intubated pre/post CPR? Yes   Medications Administered: Y = Yes; Blank = No Amiodarone    Atropine  Y  Calcium    Epinephrine   Y  Lidocaine     Magnesium     Norepinephrine     Phenylephrine     Sodium bicarbonate  Y  Vasopressin      Post CPR evaluation:  - Final Status - Was patient successfully resuscitated ? Yes - What is current rhythm? Sinus brady - What is current hemodynamic status? Critical   Miscellaneous Information:  - Labs sent, including: CBC, CMP, lactic, troponin   - Primary team notified?  Yes  - Family Notified? Yes  - Additional notes/ transfer status: Transfer to 3M07, PCCM at bedside     Kare Dado, DO  2024/09/06, 1:07 AM

## 2024-08-25 NOTE — Consult Note (Signed)
 NAME:  Daisy [name], MRN:  [id number], DOB:  Oct 27, 1956, LOS: 20 ADMISSION DATE:  07/25/2024, CONSULTATION DATE:  07/25/2024 REFERRING MD: Darnella - NSGY, CHIEF COMPLAINT:  Sepsis/SAH   History of Present Illness:  68 year old woman who presented to [facility] 8/31 for AMS and not feel well x 1 week. PMHx significant for HTN, COPD (not on home O2), DMT2, vitamin D deficiency, GERD, chronic back pain, EtOH abuse, polysubstance abuse.  Per report, patient was confused and feeling unwell x 1 week and was found down at home. Patient's son called 911. EMS found patient on the ground and drenched in urine with tachycardia and temp 99.53F. Patient was reportedly more confused on day of admission and had been urinating frequently and on herself and having headaches.  She drinks 2-6 beers daily and uses cocaine (last 1 week PTA).  On ED arrival, labs were notable for K 2.6, AG 17, LA 3--->5.9, WBC 17.8. Working diagnosis was sepsis and patient was started on antibiotics.  Due to confusion, CT Head performed demonstrating SAH.  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  CT chest/abd/pelvis did not reveal any acute issues. Transferred to [facility] for further care.   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 Spot EEG negative for seizures. Permissive HTN, off  of Cleviprex  per NSGY.  Echocardiogram showed hyperechoic mobile density in the anterior left hip of the mitral valve.  LVEF 60 to 65% normal RV function. Mental status declined on 9/1 with worsening lethargy no longer following commands.  CT was initially negative for change, EEG has been negative for seizure.  BP parameters changed to make goal 180-200 for permissive hypertension on 9/2.  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  Mental status continues to be poor.  Cultures still negative. 9/6: Tracking today.  Saying ouch to painful stimuli. 9/9: stable. Still on levo/vaso for SBP >160 9/10 EVD removed. Wide BP swings. Intermittent fevers. Max 102.5. 9/12 arterial line removed, on intermittent levophed  for nimotop  9/16-9/17 PCCM sign off, TRH resuming care with Neuro, Palliative Care following  09-12-24 Reconsult due to Brady/PEA Arrest/Asystole, CPR for 24-26 mins- Transferred to ICU, brief ROSC- patient   Interim History / Subjective:  Peri-arresting arriving in ICU  Refractory Hypotension, HR- 40s, brady  Rescue Breathing by RT via ETT  Code Team at beside   Family coming to beside   Objective:   Blood pressure (!) 75/52, pulse 72, temperature (!) 100.4 F (38 C), resp. rate 18, height 5' 6 (1.676 m), weight 60.1 kg, SpO2 92%.        Intake/Output Summary (Last  24 hours) at 08/28/2024 0106 Last data filed at 08/13/2024 2323 Gross per 24 hour  Intake 2765.62 ml  Output 1050 ml  Net 1715.62 ml   Filed Weights   08/11/24 0353 08/12/24 0500 08/13/24 0500  Weight: 59.6 kg 59 kg 60.1 kg   Physical Examination:  General: acute on chronic older adult female toxic  appearing, actively dying, lying on ICU bed, rescue breathing by RT via ETT   HEENT: Normocephalic, ETT in place  CV: brady-HR 40s, refractory hypotension despite epi IV pushes  pulm: easy to bag per RT, rescue breathing performed  Abs: bs active, soft  Extremities: deconditioned, frail Skin: no rash  Neuro: Rass -4, no movements  GU: foley   Resolved Problem List:  N/a   Assessment and Plan:  PEA Arrest/Asystole  Reported that patient having intermittent episodes of tachypnea- RR 56-60 ABG obtained during earlier during the shift at 21:25 on 9/19  PH 7.48, PCO2 20, PO2 61, Bicarb 15.3- suspect patient compensating for metabolic acidosis, with bicarb being low, which could be source of decompensation, prior to PEA Arrest/Code  Upon arrival to ICU, patient was peri-arresting, refractory hypotension despite Epinephrine  pushes, and levophed   Patient was given atropine, at least 7 epi Pushes given, and bicarb given during initial 24-26 mins of CPR before ROSC obtained Unfortunately appears patient is actively dying Family notified, and coming to beside P: Discussed with Son-Jermaine at beside in regards to patient's critical care status as well as patient peri-arresting. Recommended to Jermaine that unfortunately patient is actively dying and that we should focus care on comfort to allow patient to die peacefully, and prevent further suffering by not initiating CPR-chest compressions, defibrillation, or additional ACLS meds. 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  Prior SAH, IVH and ventricular enlargement have improved. MRI done showed  large left ACA acute infarct without midline shift and other multiple vascular territory infarct. Repeat head CT 9/10 stable from 9/5 with scattered bilateral infarcts and stable volume of SAH, IVH. EVD removed 9/10. 7 days Keppra  ended 9/9.  Fever - resolved, ongoing low grade  Leukocytosis - stable  Sinus tachycardia  Mobile mass on mitral valve leaflet: History of COPD  History of ETOH use Polysubstance abuse, cocaine  Chronic pain  History of HTN DMT2 Physical deconditioning At risk for malnutrition GOC: Palliative Following    Sherlean Sharps AGACNP-BC   [facility] 28-Aug-2024, 1:45 AM  Please see [email or web address] for pager details.  From 7A-7P if no response, please call [phone]. After hours, please call ELink [phone].

## 2024-08-25 NOTE — Progress Notes (Addendum)
 Code blue called. Pt noted to unresponsive . CPR initiated.

## 2024-08-25 NOTE — Progress Notes (Signed)
 ETT removed per NP order, pt deceased prior to extubation.  Family at bedside.

## 2024-08-25 NOTE — Progress Notes (Signed)
   08/19/2024 0132  Spiritual Encounters  Type of Visit Initial  Care provided to: Family  Reason for visit Code  OnCall Visit Yes   Chaplain responded to Code Blue. Patient was resuscitated but remained unconscious. Chaplain escorted two family members of Patient from ER to Pt's room. Chaplain provided spiritual care to family as Patient was transitioned to comfort care and then passed away. Upon request, Chaplain prayed with family members and deceased and provided family with funeral home list.   Chaplain Wonder Donaway

## 2024-08-25 NOTE — Progress Notes (Signed)
 Dr Charlton paged . Pt breathing at 50 -66 breathes per minute. Oxycodone  and ativan  given prior to paging MD.

## 2024-08-25 DEATH — deceased

## 2024-09-16 ENCOUNTER — Ambulatory Visit: Admitting: Neurology

## 2024-09-21 MED FILL — Medication: Qty: 2 | Status: AC

## 2025-02-08 ENCOUNTER — Ambulatory Visit: Admitting: Dermatology
